# Patient Record
Sex: Female | Born: 1985 | Race: Black or African American | Hispanic: No | Marital: Single | Smoking: Current every day smoker
Health system: Southern US, Community
[De-identification: ages and names within clinical notes are randomized; demographics above are authoritative.]

## PROBLEM LIST (undated history)

## (undated) DIAGNOSIS — J45909 Unspecified asthma, uncomplicated: Secondary | ICD-10-CM

## (undated) DIAGNOSIS — R569 Unspecified convulsions: Secondary | ICD-10-CM

## (undated) DIAGNOSIS — M5126 Other intervertebral disc displacement, lumbar region: Secondary | ICD-10-CM

---

## 2014-03-04 ENCOUNTER — Emergency Department (HOSPITAL_COMMUNITY): Payer: Medicaid - Out of State

## 2014-03-04 ENCOUNTER — Encounter (HOSPITAL_COMMUNITY): Payer: Self-pay | Admitting: Emergency Medicine

## 2014-03-04 ENCOUNTER — Emergency Department (HOSPITAL_COMMUNITY)
Admission: EM | Admit: 2014-03-04 | Discharge: 2014-03-04 | Disposition: A | Discharge status: Home / Self Care | Payer: Medicaid - Out of State | Attending: Emergency Medicine | Admitting: Emergency Medicine

## 2014-03-04 DIAGNOSIS — M549 Dorsalgia, unspecified: Secondary | ICD-10-CM | POA: Diagnosis not present

## 2014-03-04 DIAGNOSIS — Z3202 Encounter for pregnancy test, result negative: Secondary | ICD-10-CM | POA: Insufficient documentation

## 2014-03-04 DIAGNOSIS — Z87828 Personal history of other (healed) physical injury and trauma: Secondary | ICD-10-CM | POA: Insufficient documentation

## 2014-03-04 DIAGNOSIS — G8929 Other chronic pain: Secondary | ICD-10-CM | POA: Diagnosis not present

## 2014-03-04 DIAGNOSIS — F17200 Nicotine dependence, unspecified, uncomplicated: Secondary | ICD-10-CM | POA: Diagnosis not present

## 2014-03-04 DIAGNOSIS — J45909 Unspecified asthma, uncomplicated: Secondary | ICD-10-CM | POA: Diagnosis not present

## 2014-03-04 DIAGNOSIS — M5126 Other intervertebral disc displacement, lumbar region: Secondary | ICD-10-CM

## 2014-03-04 HISTORY — DX: Unspecified asthma, uncomplicated: J45.909

## 2014-03-04 HISTORY — DX: Other intervertebral disc displacement, lumbar region: M51.26

## 2014-03-04 LAB — POC URINE PREG, ED: Preg Test, Ur: NEGATIVE

## 2014-03-04 MED ORDER — ACETAMINOPHEN-CODEINE #3 300-30 MG PO TABS: 1.0000 | ORAL_TABLET | Freq: Three times a day (TID) | ORAL | Status: DC | PRN

## 2014-03-04 MED ORDER — IBUPROFEN 400 MG PO TABS
800.0000 mg | ORAL_TABLET | Freq: Once | ORAL | Status: AC
  Administered 2014-03-04: 800 mg via ORAL
  Filled 2014-03-04: qty 2

## 2014-03-04 MED ORDER — CYCLOBENZAPRINE HCL 10 MG PO TABS: 10.0000 mg | ORAL_TABLET | Freq: Two times a day (BID) | ORAL | Status: DC | PRN
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
## 2014-03-04 NOTE — ED Notes (Signed)
Per pt woke up this am with lower back pain. Hx of herniated disc. sts feels the same. Denies urinary or vaginal complaints.

## 2014-03-04 NOTE — Discharge Instructions (Signed)
Please call your doctor for a followup appointment within 24-48 hours. When you talk to your doctor please let them know that you were seen in the emergency department and have them acquire all of your records so that they can discuss the findings with you and formulate a treatment plan to fully care for your new and ongoing problems. Please call and set-up an appointment with your primary care provider and orthopedics Please rest and stay hydrated please apply heat and massage Please take medications as prescribed - while on pain medications there is to be no drinking alcohol, driving, operating any heavy machinery. If extra please dispose in a proper manner. Please do not take any extra Tylenol with this medication for this can lead to Tylenol overdose and liver issues.  Please avoid any physical or strenuous activities Please continue to monitor symptoms closely and if symptoms are to worsen or change (fever greater than 101, chills, sweating, nausea, vomiting, chest pain, shortness of breathe, difficulty breathing, weakness, numbness, tingling, worsening or changes to pain pattern, fall, injury, inability to control urine or bowel movements, weakness) please report back to the Emergency Department immediately.    Back Exercises Back exercises help treat and prevent back injuries. The goal is to increase your strength in your belly (abdominal) and back muscles. These exercises can also help with flexibility. Start these exercises when told by your doctor. HOME CARE Back exercises include: Pelvic Tilt.  Lie on your back with your knees bent. Tilt your pelvis until the lower part of your back is against the floor. Hold this position 5 to 10 sec. Repeat this exercise 5 to 10 times. Knee to Chest.  Pull 1 knee up against your chest and hold for 20 to 30 seconds. Repeat this with the other knee. This may be done with the other leg straight or bent, whichever feels better. Then, pull both knees up  against your chest. Sit-Ups or Curl-Ups.  Bend your knees 90 degrees. Start with tilting your pelvis, and do a partial, slow sit-up. Only lift your upper half 30 to 45 degrees off the floor. Take at least 2 to 3 seonds for each sit-up. Do not do sit-ups with your knees out straight. If partial sit-ups are difficult, simply do the above but with only tightening your belly (abdominal) muscles and holding it as told. Hip-Lift.  Lie on your back with your knees flexed 90 degrees. Push down with your feet and shoulders as you raise your hips 2 inches off the floor. Hold for 10 seconds, repeat 5 to 10 times. Back Arches.  Lie on your stomach. Prop yourself up on bent elbows. Slowly press on your hands, causing an arch in your low back. Repeat 3 to 5 times. Shoulder-Lifts.  Lie face down with arms beside your body. Keep hips and belly pressed to floor as you slowly lift your head and shoulders off the floor. Do not overdo your exercises. Be careful in the beginning. Exercises may cause you some mild back discomfort. If the pain lasts for more than 15 minutes, stop the exercises until you see your doctor. Improvement with exercise for back problems is slow.  Document Released: 06/24/2010 Document Revised: 08/14/2011 Document Reviewed: 03/23/2011 [facility] Patient Information 2015 [place], [place]. This information is not intended to replace advice given to you by your health care provider. Make sure you discuss any questions you have with your health care provider. Back Pain, Adult Back pain is very common. The pain often gets better  over time. The cause of back pain is usually not dangerous. Most people can learn to manage their back pain on their own.  HOME CARE   Stay active. Start with short walks on flat ground if you can. Try to walk farther each day.  Do not sit, drive, or stand in one place for more than 30 minutes. Do not stay in bed.  Do not avoid exercise or work. Activity can help your back  heal faster.  Be careful when you bend or lift an object. Bend at your knees, keep the object close to you, and do not twist.  Sleep on a firm mattress. Lie on your side, and bend your knees. If you lie on your back, put a pillow under your knees.  Only take medicines as told by your doctor.  Put ice on the injured area.  Put ice in a plastic bag.  Place a towel between your skin and the bag.  Leave the ice on for 15-20 minutes, 03-04 times a day for the first 2 to 3 days. After that, you can switch between ice and heat packs.  Ask your doctor about back exercises or massage.  Avoid feeling anxious or stressed. Find good ways to deal with stress, such as exercise. GET HELP RIGHT AWAY IF:   Your pain does not go away with rest or medicine.  Your pain does not go away in 1 week.  You have new problems.  You do not feel well.  The pain spreads into your legs.  You cannot control when you poop (bowel movement) or pee (urinate).  Your arms or legs feel weak or lose feeling (numbness).  You feel sick to your stomach (nauseous) or throw up (vomit).  You have belly (abdominal) pain.  You feel like you may pass out (faint). MAKE SURE YOU:   Understand these instructions.  Will watch your condition.  Will get help right away if you are not doing well or get worse. Document Released: 11/08/2007 Document Revised: 08/14/2011 Document Reviewed: 09/23/2013 [facility] Patient Information 2015 [place], [place]. This information is not intended to replace advice given to you by your health care provider. Make sure you discuss any questions you have with your health care provider.   Emergency Department Resource Guide 1) Find a Doctor and Pay Out of Pocket Although you won't have to find out who is covered by your insurance plan, it is a good idea to ask around and get recommendations. You will then need to call the office and see if the doctor you have chosen will accept you as a new  patient and what types of options they offer for patients who are self-pay. Some doctors offer discounts or will set up payment plans for their patients who do not have insurance, but you will need to ask so you aren't surprised when you get to your appointment.  2) Contact Your Local Health Department Not all health departments have doctors that can see patients for sick visits, but many do, so it is worth a call to see if yours does. If you don't know where your local health department is, you can check in your phone book. The CDC also has a tool to help you locate your state's health department, and many state websites also have listings of all of their local health departments.  3) Find a Walk-in Clinic If your illness is not likely to be very severe or complicated, you may want to try a walk in  clinic. These are popping up all over the country in pharmacies, drugstores, and shopping centers. They're usually staffed by nurse practitioners or physician assistants that have been trained to treat common illnesses and complaints. They're usually fairly quick and inexpensive. However, if you have serious medical issues or chronic medical problems, these are probably not your best option.  No Primary Care Doctor: - Call Health Connect at  [phone] - they can help you locate a primary care doctor that  accepts your insurance, provides certain services, etc. - Physician Referral Service- [phone]  Chronic Pain Problems: Organization         Address  Phone   Notes  [facility]  [phone] Patients need to be referred by their primary care doctor.   Medication Assistance: Organization         Address  Phone   Notes  [facility] Medication [facility] [address] [place], [place] [address] [phone] --Must be a resident of [facility] -- Must have NO insurance coverage whatsoever (no Medicaid/ Medicare, etc.) -- The pt. MUST have a primary  care doctor that directs their care regularly and follows them in the community   [facility]  [phone]   [employer]  [phone]    Agencies that provide inexpensive medical care: Organization         Address  Phone   Notes  [facility]  [phone]   [facility]    [phone]   [facility] [address] [place], [place] [address] [phone]   [facility] [address], [place] [phone]   Planned Parenthood    ([phone]   [facility]    [phone]   [facility]  [address], [place] Phone:  [phone], Fax:  [phone] Hours of Operation:  9 am - 6 pm, M-F.  Also accepts Medicaid/Medicare and self-pay.  [facility]  [address], Agency Phone: [phone], Fax: [phone]. Hours of Operation:  8:30 am - 5:30 pm, M-F.  Also accepts Medicaid and self-pay.  [facility] [place] [address], [place] Phone: [phone]   [facility] [address] Natasha Bence [place], [place] [phone], Ext. [phone] Mondays & Thursdays: 7-9 AM.  First 15 patients are seen on a first come, first serve basis.    Medicaid-accepting [facility] Providers:  Organization         Address  Phone   Notes  [facility] [address], [place] [phone] Also accepts self-pay patients.  [facility] [address] Laurell Josephs [place], [place]  [phone]   [facility] [address], [place] [phone]   [facility] [address], [place] [phone]   Renaye Rakers [address], [place]   [phone] Only accepts [place] Access [place] patients after they have their name applied to their card.   Self-Pay (no insurance) in [facility]:  Organization         Address  Phone   Notes  Sickle Cell Patients, [facility] Internal [facility] [address] [place], [place] [phone]   [facility] [address] [place], [place] [phone]   [facility] [place]  [address], [place] [phone]   [facility]/Dr. Osei-Bonsu  [address] [place] or [address], [place] [phone] Phone number for both [place] and [place] locations is the same.  [facility] [address] [place] [phone]   [facility] [address] [place] or [address] [phone]   [facility] [address] [place] [phone], phone; [phone], fax Sees patients 1st and 3rd Saturday of every month.  Must not qualify for public or private insurance (i.e. Medicaid, Medicare, [place] Health Choice, Veterans' Benefits)  Household income should be no more than 200% of the poverty level The clinic cannot treat you if you are pregnant or think you are pregnant  Sexually transmitted diseases are not treated at the clinic.    Dental Care: Organization         Address  Phone  Notes  [facility] [address] [place], [place] [phone] Accepts children up to age 45 who are enrolled in [place] or [place] Health Choice; pregnant women with a Medicaid card; and children who have applied for Medicaid or [place] Health Choice, but were declined, whose parents can pay a reduced fee at time of service.  [facility] Department of [facility]  [address], [place] [phone] Accepts children up to age 3 who are enrolled in [place] or [place] Health Choice; pregnant women with a Medicaid card; and children who have applied for Medicaid or  Health Choice, but were declined, whose parents can  pay a reduced fee at time of service.  [facility]  [address] [place], [place] [phone] Patients are seen by appointment only. Walk-ins are not accepted. [facility] will see patients 37 years of age and older. Monday - Tuesday (8am-5pm) Most Wednesdays (8:30-5pm) $30 per visit, cash only  [facility] Adult Dental Access PROGRAM  [address], [facility] [phone] Patients are seen by appointment only. Walk-ins are not accepted. [facility] will see patients 4 years of age and older. One Wednesday Evening (Monthly: Volunteer Based).  $30 per visit, cash only  [employer]  [phone] for adults; Children under age 87, call Graduate Pediatric Dentistry at [phone]. Children aged 30-14, please call [phone] to request a pediatric application.  Dental services are provided in all areas of dental care including fillings, crowns and bridges, complete and partial dentures, implants, gum treatment, root canals, and extractions. Preventive care is also provided. Treatment is provided to both adults and children. Patients are selected via a lottery and there is often a waiting list.   [facility] [address] [place]  [phone] [email or web address]   [facility] [address] [place], [place] [phone], Ext. [phone] Second and Fourth Thursday of each month, opens at 6:30 AM; Clinic ends at 9 AM.  Patients are seen on a first-come first-served basis, and a limited number are seen during each clinic.   [facility]  [address] Ether Griffins [place], [place] [phone]   Eligibility Requirements You must have lived in [place], [place], or [place] counties for at least the last three months.   You cannot be eligible for state or federal sponsored [place], including [employer]  Administration, Medicaid, or Medicare.   You generally cannot be eligible for healthcare  insurance through your employer.    How to apply: Eligibility screenings are held every Tuesday and Wednesday afternoon from 1:00 pm until 4:00 pm. You do not need an appointment for the interview!  [facility] [address], [place], [place] [phone]   [facility]  [phone]   [facility] Health Department  [phone]   [facility] Health Department  [phone]    Behavioral Health Resources in the Community: Intensive Outpatient Programs Organization         Address  Phone  Notes  [facility] [address], [place], [place] [phone]   [facility] Outpatient [address] [place], [place] [phone]   [facility] [address], [place], [place]  [phone]   [facility] [address]  [place], [place] [phone] or [phone]   Substance Abuse Resources Organization         Address  Phone  Notes  [facility]  [phone]   [facility]  [phone]   [facility]  [phone]   Floydene Flock  [phone]   Residential & Outpatient Substance Abuse Program  [phone]   Psychological Services Organization         Address  Phone  Notes  [facility]  [phone]   [facility] Services  [phone]   [facility] Mental Health [address], [place] [phone] or [phone]    Mobile Crisis Teams Organization         Address  Phone  Notes  [facility], Mobile Crisis Care Unit  [phone]   [facility]  [address] [place], [place] [phone]   Doristine Locks [address] [place] [phone]    Self-Help/Support Groups Organization         Address  Phone             Notes  [facility] [place] - variety of support groups  336- I7437963 Call for more information  Narcotics Anonymous (NA),  [facility] [address], [employer] [place]  2 meetings at this location   Statistician         Address  Phone  Notes  [facility] [address],    [place]  [phone]   [facility]  [address] [place] [address], [place], [place] [phone]   [facility] [address] [place], [place] [phone] Admissions: 8am-3pm M-F  [facility] [address],    [place], [place] [phone]   [facility] [address] [place], [place], [place] [phone]   [facility] [address],  [place], [place] [phone]   [facility] - Intensive Outpatient [address] Laurell Josephs [address], [place], [place] [phone]   [facility] ([facility] [address] [place]  [place], [place] [phone] or [phone]   [facility] ([facility]) [address] [place], [place] [phone] Accepts Medicaid  [facility] [place] [address],  [place] [phone] Substance Abuse/Addiction Treatment   [facility] Organization         Address  Phone  Notes  [facility]  [phone]   Angie Fava, PhD [address] [place], [place]   ([phone] or [phone]  [facility]   [address] [place], [place] [phone]) [phone]   [facility] [address], [place], [place] [phone](336) [phone] Insurance/Medicaid/sponsorship through [facility]  [facility] [address]                                    [place], [place] [phone](336) [phone] Therapy/tele-psych/case  [facility] [address]   [place], [place] [phone]) [phone]    Dr. Lolly MustacheArfeen  [phone]) [phone]   [facility]  [employer] [facility] 1) [address], [place] 2) [address], [place] 3)  [address], [place] [phone](336) [phone](336) [phone]  [phone](336) [phone]    [facility] Child Abuse Hotline [phone](336) 930-419-4170 or [phone](336) [phone] (After Hours)

## 2014-03-06 NOTE — ED Provider Notes (Signed)
Medical screening examination/treatment/procedure(s) were performed by non-physician practitioner and as supervising physician I was immediately available for consultation/collaboration.   EKG Interpretation None        Kasandra Fehr T Shrey Boike, MD 03/06/14 0720 

## 2014-08-28 ENCOUNTER — Emergency Department (HOSPITAL_COMMUNITY)
Admission: EM | Admit: 2014-08-28 | Discharge: 2014-08-29 | Disposition: A | Discharge status: Home / Self Care | Payer: Medicaid Other | Attending: Emergency Medicine | Admitting: Emergency Medicine

## 2014-08-28 ENCOUNTER — Encounter (HOSPITAL_COMMUNITY): Payer: Self-pay | Admitting: Emergency Medicine

## 2014-08-28 DIAGNOSIS — J45909 Unspecified asthma, uncomplicated: Secondary | ICD-10-CM | POA: Insufficient documentation

## 2014-08-28 DIAGNOSIS — Z3202 Encounter for pregnancy test, result negative: Secondary | ICD-10-CM | POA: Insufficient documentation

## 2014-08-28 DIAGNOSIS — R112 Nausea with vomiting, unspecified: Secondary | ICD-10-CM | POA: Insufficient documentation

## 2014-08-28 DIAGNOSIS — R109 Unspecified abdominal pain: Secondary | ICD-10-CM | POA: Diagnosis not present

## 2014-08-28 DIAGNOSIS — Z8739 Personal history of other diseases of the musculoskeletal system and connective tissue: Secondary | ICD-10-CM | POA: Diagnosis not present

## 2014-08-28 DIAGNOSIS — Z720 Tobacco use: Secondary | ICD-10-CM | POA: Insufficient documentation

## 2014-08-28 DIAGNOSIS — N939 Abnormal uterine and vaginal bleeding, unspecified: Secondary | ICD-10-CM | POA: Diagnosis not present

## 2014-08-28 LAB — CBC WITH DIFFERENTIAL/PLATELET
Basophils Absolute: 0 10*3/uL (ref 0.0–0.1)
Basophils Relative: 0 % (ref 0–1)
EOS PCT: 0 % (ref 0–5)
Eosinophils Absolute: 0 10*3/uL (ref 0.0–0.7)
HCT: 41.9 % (ref 36.0–46.0)
Hemoglobin: 14.5 g/dL (ref 12.0–15.0)
LYMPHS PCT: 29 % (ref 12–46)
Lymphs Abs: 1.9 10*3/uL (ref 0.7–4.0)
MCH: 29.9 pg (ref 26.0–34.0)
MCHC: 34.6 g/dL (ref 30.0–36.0)
MCV: 86.4 fL (ref 78.0–100.0)
MONO ABS: 0.4 10*3/uL (ref 0.1–1.0)
Monocytes Relative: 6 % (ref 3–12)
Neutro Abs: 4.3 10*3/uL (ref 1.7–7.7)
Neutrophils Relative %: 65 % (ref 43–77)
Platelets: 227 10*3/uL (ref 150–400)
RBC: 4.85 MIL/uL (ref 3.87–5.11)
RDW: 12.7 % (ref 11.5–15.5)
WBC: 6.7 10*3/uL (ref 4.0–10.5)

## 2014-08-28 LAB — URINALYSIS, ROUTINE W REFLEX MICROSCOPIC
GLUCOSE, UA: NEGATIVE mg/dL
HGB URINE DIPSTICK: NEGATIVE
Leukocytes, UA: NEGATIVE
Nitrite: NEGATIVE
Protein, ur: NEGATIVE mg/dL
Specific Gravity, Urine: 1.031 — ABNORMAL HIGH (ref 1.005–1.030)
Urobilinogen, UA: 2 mg/dL — ABNORMAL HIGH (ref 0.0–1.0)
pH: 7 (ref 5.0–8.0)

## 2014-08-28 LAB — COMPREHENSIVE METABOLIC PANEL
ALBUMIN: 4.6 g/dL (ref 3.5–5.2)
ALK PHOS: 44 U/L (ref 39–117)
ALT: 17 U/L (ref 0–35)
AST: 19 U/L (ref 0–37)
Anion gap: 8 (ref 5–15)
BILIRUBIN TOTAL: 0.5 mg/dL (ref 0.3–1.2)
BUN: 17 mg/dL (ref 6–23)
CALCIUM: 8.7 mg/dL (ref 8.4–10.5)
CO2: 24 mmol/L (ref 19–32)
CREATININE: 0.65 mg/dL (ref 0.50–1.10)
Chloride: 107 mmol/L (ref 96–112)
GFR calc Af Amer: >90 mL/min (ref >90)
GFR calc non Af Amer: >90 mL/min (ref >90)
Glucose, Bld: 109 mg/dL — ABNORMAL HIGH (ref 70–99)
Potassium: 3.6 mmol/L (ref 3.5–5.1)
Sodium: 139 mmol/L (ref 135–145)
Total Protein: 7.4 g/dL (ref 6.0–8.3)

## 2014-08-28 LAB — POC URINE PREG, ED: Preg Test, Ur: NEGATIVE

## 2014-08-28 MED ORDER — ONDANSETRON 8 MG PO TBDP: 8.0000 mg | ORAL_TABLET | Freq: Three times a day (TID) | ORAL | Status: DC | PRN

## 2014-08-28 MED ORDER — SODIUM CHLORIDE 0.9 % IV BOLUS (SEPSIS)
1000.0000 mL | Freq: Once | INTRAVENOUS | Status: AC
  Administered 2014-08-28: 1000 mL via INTRAVENOUS

## 2014-08-28 MED ORDER — KETOROLAC TROMETHAMINE 30 MG/ML IJ SOLN
30.0000 mg | Freq: Once | INTRAMUSCULAR | Status: AC
  Administered 2014-08-28: 30 mg via INTRAVENOUS
  Filled 2014-08-28: qty 1

## 2014-08-28 MED ORDER — ONDANSETRON HCL 4 MG/2ML IJ SOLN
4.0000 mg | Freq: Once | INTRAMUSCULAR | Status: AC
  Administered 2014-08-28: 4 mg via INTRAVENOUS
  Filled 2014-08-28: qty 2

## 2014-08-28 NOTE — ED Notes (Signed)
Pt c/o abdominal cramping and states she "stopped her depo and now I have started my period with severe cramps".  Patient denies passing any clots, heavy menses, and states that she is "spotting".  Pt states she could possibly be pregnant and the last sexual intercourse was two weeks ago.  Pt c/o N/V with too many episodes to count.  Pt A&Ox4 and is ambulatory.

## 2014-08-28 NOTE — ED Provider Notes (Signed)
CSN: [id number]     Arrival date & time 08/28/14  2028 History   First MD Initiated Contact with Patient 08/28/14 2214     Chief Complaint  Patient presents with  . Emesis  . Abdominal Cramping     (Consider location/radiation/quality/duration/timing/severity/associated sxs/prior Treatment) Patient is a 29 y.o. female presenting with vomiting and cramps. The history is provided by the patient.  Emesis Associated symptoms: abdominal pain   Associated symptoms: no diarrhea and no headaches   Abdominal Cramping Associated symptoms include abdominal pain. Pertinent negatives include no chest pain, no headaches and no shortness of breath.   patient presents with abdominal cramping nausea and vomiting. Had been on a double shot and recently finished up. She states that she started her menses has had some spotting last couple days. States she's had severe cramping. States that she had difficult periods all she was having them. Denies vaginal discharge. States she just had her spotting. No fevers. States she has been able to keep anything down today. She also has had crampy abdominal pain.  Past Medical History  Diagnosis Date  . Herniated lumbar intervertebral disc   . Asthma    History reviewed. No pertinent past surgical history. No family history on file. History  Substance Use Topics  . Smoking status: Current Every Day Smoker -- 0.50 packs/day  . Smokeless tobacco: Not on file  . Alcohol Use: Yes   OB History    No data available     Review of Systems  Constitutional: Negative for activity change and appetite change.  Eyes: Negative for pain.  Respiratory: Negative for chest tightness and shortness of breath.   Cardiovascular: Negative for chest pain and leg swelling.  Gastrointestinal: Positive for nausea, vomiting and abdominal pain. Negative for diarrhea.  Genitourinary: Positive for vaginal bleeding and menstrual problem. Negative for flank pain.  Musculoskeletal:  Negative for back pain and neck stiffness.  Skin: Negative for rash.  Neurological: Negative for weakness, numbness and headaches.  Psychiatric/Behavioral: Negative for behavioral problems.      Allergies  Bactrim  Home Medications   Prior to Admission medications   Medication Sig Start Date End Date Taking? Authorizing Provider  acetaminophen (TYLENOL) 500 MG tablet Take 1,000 mg by mouth every 6 (six) hours as needed for mild pain.   Yes Historical Provider, MD  acetaminophen-codeine (TYLENOL #3) 300-30 MG per tablet Take 1 tablet by mouth every 8 (eight) hours as needed for moderate pain. Patient not taking: Reported on 08/28/2014 03/04/14   Marissa Sciacca, PA-C  cyclobenzaprine (FLEXERIL) 10 MG tablet Take 1 tablet (10 mg total) by mouth 2 (two) times daily as needed for muscle spasms. Patient not taking: Reported on 08/28/2014 03/04/14   Marissa Sciacca, PA-C  ondansetron (ZOFRAN-ODT) 8 MG disintegrating tablet Take 1 tablet (8 mg total) by mouth every 8 (eight) hours as needed for nausea or vomiting. 08/28/14   Benjiman CoreNathan Orris Perin, MD   BP 135/89 mmHg  Pulse 91  Temp(Src) 98.1 F (36.7 C) (Oral)  Resp 16  SpO2 100%  LMP 08/28/2014 Physical Exam  Constitutional: She is oriented to person, place, and time. She appears well-developed.  HENT:  Head: Normocephalic and atraumatic.  Cardiovascular: Normal rate, regular rhythm and normal heart sounds.   No murmur heard. Pulmonary/Chest: Effort normal and breath sounds normal. No respiratory distress. She has no wheezes. She has no rales.  Abdominal: Soft. She exhibits no mass. There is no tenderness. There is no guarding.  Musculoskeletal: Normal range of  motion.  Neurological: She is alert and oriented to person, place, and time. No cranial nerve deficit.  Skin: Skin is warm and dry.  Psychiatric: She has a normal mood and affect. Her speech is normal.  Nursing note and vitals reviewed.   ED Course  Procedures (including critical  care time) Labs Review Labs Reviewed  COMPREHENSIVE METABOLIC PANEL - Abnormal; Notable for the following:    Glucose, Bld 109 (*)    All other components within normal limits  URINALYSIS, ROUTINE W REFLEX MICROSCOPIC - Abnormal; Notable for the following:    APPearance CLOUDY (*)    Specific Gravity, Urine 1.031 (*)    Bilirubin Urine SMALL (*)    Ketones, ur >80 (*)    Urobilinogen, UA 2.0 (*)    All other components within normal limits  CBC WITH DIFFERENTIAL/PLATELET  POC URINE PREG, ED    Imaging Review No results found.   EKG Interpretation None      MDM   Final diagnoses:  Non-intractable vomiting with nausea, vomiting of unspecified type    Patient with nausea and vomiting. May be related to return of her menses after being on the Depo-Provera shot. Has had bad menses in the past. Patient feels better as tolerated orals in ER. We'll give second IV fluid and discharge home. Will follow-up with her PCP as needed.    Benjiman Core, MD 08/28/14 [phone]

## 2014-08-28 NOTE — Discharge Instructions (Signed)

## 2014-09-21 ENCOUNTER — Emergency Department (HOSPITAL_COMMUNITY): Payer: Medicaid - Out of State

## 2014-09-21 ENCOUNTER — Emergency Department (HOSPITAL_COMMUNITY)
Admission: EM | Admit: 2014-09-21 | Discharge: 2014-09-21 | Disposition: A | Discharge status: Home / Self Care | Payer: Medicaid - Out of State | Attending: Emergency Medicine | Admitting: Emergency Medicine

## 2014-09-21 ENCOUNTER — Encounter (HOSPITAL_COMMUNITY): Payer: Self-pay | Admitting: Emergency Medicine

## 2014-09-21 DIAGNOSIS — R569 Unspecified convulsions: Secondary | ICD-10-CM | POA: Diagnosis not present

## 2014-09-21 DIAGNOSIS — J45909 Unspecified asthma, uncomplicated: Secondary | ICD-10-CM | POA: Insufficient documentation

## 2014-09-21 DIAGNOSIS — Z720 Tobacco use: Secondary | ICD-10-CM | POA: Insufficient documentation

## 2014-09-21 HISTORY — DX: Unspecified convulsions: R56.9

## 2014-09-21 LAB — CBC WITH DIFFERENTIAL/PLATELET
Basophils Absolute: 0 10*3/uL (ref 0.0–0.1)
Basophils Relative: 1 % (ref 0–1)
Eosinophils Absolute: 0 10*3/uL (ref 0.0–0.7)
Eosinophils Relative: 0 % (ref 0–5)
HCT: 41.5 % (ref 36.0–46.0)
Hemoglobin: 13.6 g/dL (ref 12.0–15.0)
LYMPHS ABS: 1.6 10*3/uL (ref 0.7–4.0)
LYMPHS PCT: 36 % (ref 12–46)
MCH: 28.7 pg (ref 26.0–34.0)
MCHC: 32.8 g/dL (ref 30.0–36.0)
MCV: 87.6 fL (ref 78.0–100.0)
Monocytes Absolute: 0.4 10*3/uL (ref 0.1–1.0)
Monocytes Relative: 9 % (ref 3–12)
NEUTROS PCT: 54 % (ref 43–77)
Neutro Abs: 2.4 10*3/uL (ref 1.7–7.7)
PLATELETS: 249 10*3/uL (ref 150–400)
RBC: 4.74 MIL/uL (ref 3.87–5.11)
RDW: 12.9 % (ref 11.5–15.5)
WBC: 4.4 10*3/uL (ref 4.0–10.5)

## 2014-09-21 LAB — COMPREHENSIVE METABOLIC PANEL
ALT: 22 U/L (ref 0–35)
AST: 23 U/L (ref 0–37)
Albumin: 4.4 g/dL (ref 3.5–5.2)
Alkaline Phosphatase: 55 U/L (ref 39–117)
Anion gap: 9 (ref 5–15)
BILIRUBIN TOTAL: 0.8 mg/dL (ref 0.3–1.2)
BUN: 17 mg/dL (ref 6–23)
CALCIUM: 8.4 mg/dL (ref 8.4–10.5)
CHLORIDE: 102 mmol/L (ref 96–112)
CO2: 25 mmol/L (ref 19–32)
Creatinine, Ser: 0.78 mg/dL (ref 0.50–1.10)
GFR calc Af Amer: >90 mL/min (ref >90)
Glucose, Bld: 99 mg/dL (ref 70–99)
Potassium: 3.6 mmol/L (ref 3.5–5.1)
Sodium: 136 mmol/L (ref 135–145)
Total Protein: 7.3 g/dL (ref 6.0–8.3)

## 2014-09-21 LAB — RAPID URINE DRUG SCREEN, HOSP PERFORMED
Amphetamines: NOT DETECTED
Barbiturates: NOT DETECTED
Benzodiazepines: NOT DETECTED
COCAINE: NOT DETECTED
OPIATES: NOT DETECTED
Tetrahydrocannabinol: NOT DETECTED

## 2014-09-21 LAB — ETHANOL

## 2014-09-21 MED ORDER — SODIUM CHLORIDE 0.9 % IV BOLUS (SEPSIS)
1000.0000 mL | Freq: Once | INTRAVENOUS | Status: AC
  Administered 2014-09-21: 1000 mL via INTRAVENOUS

## 2014-09-21 MED ORDER — ACETAMINOPHEN 325 MG PO TABS
650.0000 mg | ORAL_TABLET | Freq: Once | ORAL | Status: AC
  Administered 2014-09-21: 650 mg via ORAL
  Filled 2014-09-21: qty 2

## 2014-09-21 NOTE — ED Notes (Signed)
Per EMS: Pt was on bus when she started having a seizure. Witnesses report seizures three times. Hx of seizures but doesn't take any medications. Pt post-ictal on EMS arrival. Pt A&O x 2 upon arrival to hospital.

## 2014-09-21 NOTE — ED Notes (Signed)
Bed: WA01 Expected date:  Expected time:  Means of arrival:  Comments: EMS- 28yo F, seizure

## 2014-09-21 NOTE — ED Provider Notes (Signed)
CSN: [id number]     Arrival date & time 09/21/14  1617 History   First MD Initiated Contact with Patient 09/21/14 1637     Chief Complaint  Patient presents with  . Seizures     (Consider location/radiation/quality/duration/timing/severity/associated sxs/prior Treatment) HPI  Kelly Huang is a 29 y.o. female with PMH of asthma presenting via EMS for reported seizure. Witnesses reported generalized rhythmic movements 3 on bus today. Per friend who is at bedside patient with 40 minutes of confusion after the event. Patient currently oriented 3. Per patient no tongue biting or bladder or bowel incontinence. Patient reports decreased oral intake with only a couple sips today. No history of illicit drug use. Pt does not take medications. Patient reports history of syncope one year ago but has never had confusion like this. She has not taken any medications for her seizures. She was never evaluated by neurology.    Past Medical History  Diagnosis Date  . Herniated lumbar intervertebral disc   . Asthma   . Seizures    History reviewed. No pertinent past surgical history. No family history on file. History  Substance Use Topics  . Smoking status: Current Every Day Smoker -- 0.50 packs/day  . Smokeless tobacco: Not on file  . Alcohol Use: Yes   OB History    No data available     Review of Systems 10 Systems reviewed and are negative for acute change except as noted in the HPI.    Allergies  Bactrim  Home Medications   Prior to Admission medications   Medication Sig Start Date End Date Taking? Authorizing Provider  acetaminophen-codeine (TYLENOL #3) 300-30 MG per tablet Take 1 tablet by mouth every 8 (eight) hours as needed for moderate pain. Patient not taking: Reported on 08/28/2014 03/04/14   Marissa Sciacca, PA-C  cyclobenzaprine (FLEXERIL) 10 MG tablet Take 1 tablet (10 mg total) by mouth 2 (two) times daily as needed for muscle spasms. Patient not taking: Reported on  08/28/2014 03/04/14   Marissa Sciacca, PA-C  ondansetron (ZOFRAN-ODT) 8 MG disintegrating tablet Take 1 tablet (8 mg total) by mouth every 8 (eight) hours as needed for nausea or vomiting. Patient not taking: Reported on 09/21/2014 08/28/14   Benjiman Core, MD   BP 116/67 mmHg  Pulse 75  Temp(Src) 98.3 F (36.8 C) (Oral)  Resp 16  SpO2 100%  LMP 08/28/2014 (Exact Date) Physical Exam  Constitutional: She is oriented to person, place, and time. She appears well-developed and well-nourished. No distress.  HENT:  Head: Normocephalic and atraumatic.  Mouth/Throat: Oropharynx is clear and moist.  Eyes: Conjunctivae and EOM are normal. Pupils are equal, round, and reactive to light. Right eye exhibits no discharge. Left eye exhibits no discharge.  Neck: Normal range of motion. Neck supple.  No nuchal rigidity  Cardiovascular: Normal rate and regular rhythm.   Pulmonary/Chest: Effort normal and breath sounds normal. No respiratory distress. She has no wheezes.  Abdominal: Soft. Bowel sounds are normal. She exhibits no distension. There is no tenderness.  Neurological: She is alert and oriented to person, place, and time. No cranial nerve deficit. Coordination normal.  Speech is clear and goal oriented. Peripheral visual fields intact. Strength 5/5 in upper and lower extremities. Sensation intact. Intact rapid alternating movements, finger to nose, and heel to shin. Negative Romberg. No pronator drift. Normal gait.    Skin: Skin is warm and dry. She is not diaphoretic.  Nursing note and vitals reviewed.   ED Course  Procedures (  including critical care time) Labs Review Labs Reviewed  CBC WITH DIFFERENTIAL/PLATELET  COMPREHENSIVE METABOLIC PANEL  ETHANOL  URINE RAPID DRUG SCREEN (HOSP PERFORMED)    Imaging Review Ct Head Wo Contrast  09/21/2014   CLINICAL DATA:  Seizure.  EXAM: CT HEAD WITHOUT CONTRAST  TECHNIQUE: Contiguous axial images were obtained from the base of the skull through  the vertex without intravenous contrast.  COMPARISON:  None.  FINDINGS: Bony calvarium appears intact. No mass effect or midline shift is noted. Ventricular size is within normal limits. There is no evidence of mass lesion, hemorrhage or acute infarction.  IMPRESSION: Normal head CT.   Electronically Signed   By: Lupita RaiderJames  Green Jr, M.D.   On: 09/21/2014 17:34     EKG Interpretation   Date/Time:  Monday September 21 2014 16:37:15 EDT Ventricular Rate:  67 PR Interval:  182 QRS Duration: 105 QT Interval:  393 QTC Calculation: 415 R Axis:   99 Text Interpretation:  Sinus arrhythmia Borderline right axis deviation No  old tracing to compare Confirmed by Ethelda ChickJACUBOWITZ  MD, SAM [id number]) on  09/21/2014 4:48:00 PM      MDM   Final diagnoses:  Seizure   Patient presenting with possible first onset seizure with postictal confusion. At arrival VSS. Patient with no neurological deficits and denies any confusion. Workup unremarkable. Symptoms could've been due to dehydration. No obvious cause. Patient reports improvement with fluids in ED has no further complaints at this time. Discussed seizure precautions patient verbalizes agreement. Patient to follow-up with neurology for further workup.  Discussed return precautions with patient. Discussed all results and patient verbalizes understanding and agrees with plan.  Case has been discussed with Dr. Criss AlvineGoldston who agrees with the above plan and to discharge.    Oswaldo ConroyVictoria Brodi Kari, PA-C 09/23/14 [id number]  Pricilla LovelessScott Goldston, MD 09/26/14 [phone]

## 2014-09-21 NOTE — ED Notes (Signed)
Pt A&O x 4 at this time, follows commands. No neuro deficits noted. Pt states last seizure was years ago.

## 2014-09-21 NOTE — Discharge Instructions (Signed)
Return to the emergency room with worsening of symptoms, new symptoms or with symptoms that are concerning , especially additional seizure/loss of consciousness, unresponsive, severe headache, visual or speech changes, weakness in face, arms or legs. No driving, operating machinery, swimming, tub baths, or high risk activity including scuba diving, skydiving until evaluated by neurology. Call neurology about the schedule appointment as soon as possible. Read below information and follow recommendations.    Nonepileptic Seizures Nonepileptic seizures are seizures that are not caused by abnormal electrical signals in your brain. These seizures often seem like epileptic seizures, but they are not caused by epilepsy.  There are two types of nonepileptic seizures:  A physiologic nonepileptic seizure results from a disruption in your brain.  A psychogenic seizure results from emotional stress. These seizures are sometimes called pseudoseizures. CAUSES  Causes of physiologic nonepileptic seizures include:   Sudden drop in blood pressure.  Low blood sugar.  Low levels of salt (sodium) in your blood.  Low levels of calcium in your blood.  Migraine.  Heart rhythm problems.  Sleep disorders.  Drug and alcohol abuse. Common causes of psychogenic nonepileptic seizures include:  Stress.  Emotional trauma.  Sexual or physical abuse.  Major life events, such as divorce or the death of a loved one.  Mental health disorders, including panic attack and hyperactivity disorder. SIGNS AND SYMPTOMS A nonepileptic seizure can look like an epileptic seizure, including uncontrollable shaking (convulsions), or changes in attention, behavior, or the ability to remain awake and alert. However, there are some differences. Nonepileptic seizures usually:  Do not cause physical injuries.  Start slowly.  Include crying or shrieking.  Last longer than 2 minutes.  Have a short recovery time without  headache or exhaustion. DIAGNOSIS  Your health care provider can usually diagnose nonepileptic seizures after taking your medical history and giving you a physical exam. Your health care provider may want to talk to your friends or relatives who have seen you have a seizure.  You may also need to have tests to look for causes of physiologic nonepileptic seizures. This may include an electroencephalogram (EEG), which is a test that measures electrical activity in your brain. If you have had an epileptic seizure, the results of your EEG will be abnormal. If your health care provider thinks you have had a psychogenic nonepileptic seizure, you may need to see a mental health specialist for an evaluation. TREATMENT  Treatment depends on the type and cause of your seizures.  For physiologic nonepileptic seizures, treatment is aimed at addressing the underlying condition that caused the seizures. These seizures usually stop when the underlying condition is properly treated.  Nonepileptic seizures do not respond to the seizure medicines used to treat epilepsy.  For psychogenic seizures, you may need to work with a mental health specialist. HOME CARE INSTRUCTIONS Home care will depend on the type of nonepileptic seizures you have.   Follow all your health care provider's instructions.  Keep all your follow-up appointments. SEEK MEDICAL CARE IF: You continue to have seizures after treatment. SEEK IMMEDIATE MEDICAL CARE IF:  Your seizures change or become more frequent.  You injure yourself during a seizure.  You have one seizure after another.  You have trouble recovering from a seizure.  You have chest pain or trouble breathing. MAKE SURE YOU:  Understand these instructions.  Will watch your condition.  Will get help right away if you are not doing well or get worse. Document Released: 07/07/2005 Document Revised: 10/06/2013 Document Reviewed:  03/18/2013 ExitCare Patient Information  2015 [employer] [place]. This information is not intended to replace advice given to you by your health care provider. Make sure you discuss any questions you have with your health care provider.

## 2014-12-28 ENCOUNTER — Encounter (HOSPITAL_COMMUNITY): Payer: Self-pay | Admitting: Family Medicine

## 2014-12-28 ENCOUNTER — Emergency Department (HOSPITAL_COMMUNITY)
Admission: EM | Admit: 2014-12-28 | Discharge: 2014-12-28 | Disposition: A | Discharge status: Home / Self Care | Payer: Medicaid Other | Attending: Emergency Medicine | Admitting: Emergency Medicine

## 2014-12-28 DIAGNOSIS — Z720 Tobacco use: Secondary | ICD-10-CM | POA: Insufficient documentation

## 2014-12-28 DIAGNOSIS — Z8739 Personal history of other diseases of the musculoskeletal system and connective tissue: Secondary | ICD-10-CM | POA: Insufficient documentation

## 2014-12-28 DIAGNOSIS — J45909 Unspecified asthma, uncomplicated: Secondary | ICD-10-CM | POA: Diagnosis not present

## 2014-12-28 DIAGNOSIS — Z3202 Encounter for pregnancy test, result negative: Secondary | ICD-10-CM | POA: Diagnosis not present

## 2014-12-28 DIAGNOSIS — R42 Dizziness and giddiness: Secondary | ICD-10-CM | POA: Insufficient documentation

## 2014-12-28 DIAGNOSIS — R350 Frequency of micturition: Secondary | ICD-10-CM | POA: Diagnosis not present

## 2014-12-28 LAB — CBC
HCT: 40.5 % (ref 36.0–46.0)
Hemoglobin: 13.4 g/dL (ref 12.0–15.0)
MCH: 29.9 pg (ref 26.0–34.0)
MCHC: 33.1 g/dL (ref 30.0–36.0)
MCV: 90.4 fL (ref 78.0–100.0)
Platelets: 238 K/uL (ref 150–400)
RBC: 4.48 MIL/uL (ref 3.87–5.11)
RDW: 13 % (ref 11.5–15.5)
WBC: 4.9 K/uL (ref 4.0–10.5)

## 2014-12-28 LAB — URINALYSIS, ROUTINE W REFLEX MICROSCOPIC
Glucose, UA: NEGATIVE mg/dL
HGB URINE DIPSTICK: NEGATIVE
Ketones, ur: 15 mg/dL — AB
NITRITE: NEGATIVE
PROTEIN: NEGATIVE mg/dL
Specific Gravity, Urine: 1.029 (ref 1.005–1.030)
Urobilinogen, UA: 1 mg/dL (ref 0.0–1.0)
pH: 6 (ref 5.0–8.0)

## 2014-12-28 LAB — URINE MICROSCOPIC-ADD ON

## 2014-12-28 LAB — BASIC METABOLIC PANEL WITH GFR
Anion gap: 5 (ref 5–15)
BUN: 15 mg/dL (ref 6–20)
CO2: 25 mmol/L (ref 22–32)
Calcium: 9 mg/dL (ref 8.9–10.3)
Chloride: 108 mmol/L (ref 101–111)
Creatinine, Ser: 0.71 mg/dL (ref 0.44–1.00)
GFR calc Af Amer: >60 mL/min
GFR calc non Af Amer: >60 mL/min
Glucose, Bld: 97 mg/dL (ref 65–99)
Potassium: 3.9 mmol/L (ref 3.5–5.1)
Sodium: 138 mmol/L (ref 135–145)

## 2014-12-28 LAB — I-STAT BETA HCG BLOOD, ED (MC, WL, AP ONLY): I-stat hCG, quantitative: <5 m[IU]/mL (ref <5)

## 2014-12-28 MED ORDER — MECLIZINE HCL 25 MG PO TABS
25.0000 mg | ORAL_TABLET | Freq: Once | ORAL | Status: AC
  Administered 2014-12-28: 25 mg via ORAL
  Filled 2014-12-28: qty 1

## 2014-12-28 MED ORDER — MECLIZINE HCL 25 MG PO TABS: 25.0000 mg | ORAL_TABLET | Freq: Three times a day (TID) | ORAL | Status: DC | PRN

## 2014-12-28 MED ORDER — CEPHALEXIN 500 MG PO CAPS: 500.0000 mg | ORAL_CAPSULE | Freq: Three times a day (TID) | ORAL | Status: DC

## 2014-12-28 NOTE — Discharge Instructions (Signed)
Take the prescribed medication as directed. °Return to the ED for new or worsening symptoms. ° °

## 2014-12-28 NOTE — ED Notes (Signed)
PT placed in gown and in bed. Pt monitored by pulse ox and bp cuff. 

## 2014-12-28 NOTE — ED Notes (Signed)
Pt offered PO fluids - tolerated well.

## 2014-12-28 NOTE — ED Notes (Signed)
Pt here for dizziness since she woke up this am. sts hx of same before and possible seizure like activity.

## 2014-12-28 NOTE — ED Provider Notes (Signed)
CSN: [id number]     Arrival date & time 12/28/14  1019 History   First MD Initiated Contact with Patient 12/28/14 1313     Chief Complaint  Patient presents with  . Dizziness     (Consider location/radiation/quality/duration/timing/severity/associated sxs/prior Treatment) Patient is a 29 y.o. female presenting with dizziness. The history is provided by the patient and medical records.  Dizziness   This is a 29 year old female with history of asthma, herniated lumbar disc, solitary episode of seizures earlier this year without diagnosed seizure disorder, presenting to the ED for dizziness. Patient states since waking this morning she has been having intermittent episodes of lightheadedness. She states symptoms are worse upon standing and movement. She states when this occurs she feels somewhat off balance. She denies any feelings of syncope. No chest pain or shortness of breath. Patient denies any fever, chills, or recent illness.  No headache or neck pain.  Patient states after her last seizure she was not started on seizure medication. She states she mainly came in today because she was concerned her symptoms were precursor to another seizure.  She denies any tremors or known seizure activity today.  Also, patient does admit to some urinary frequency recently. She denies any dysuria.  No abdominal pain, flank pain, nausea, or vomiting.  VSS.  Past Medical History  Diagnosis Date  . Herniated lumbar intervertebral disc   . Asthma   . Seizures    History reviewed. No pertinent past surgical history. History reviewed. No pertinent family history. History  Substance Use Topics  . Smoking status: Current Every Day Smoker -- 0.50 packs/day  . Smokeless tobacco: Not on file  . Alcohol Use: Yes   OB History    No data available     Review of Systems  Neurological: Positive for dizziness.  All other systems reviewed and are negative.     Allergies  Bactrim  Home Medications    Prior to Admission medications   Medication Sig Start Date End Date Taking? Authorizing Provider  acetaminophen-codeine (TYLENOL #3) 300-30 MG per tablet Take 1 tablet by mouth every 8 (eight) hours as needed for moderate pain. Patient not taking: Reported on 08/28/2014 03/04/14   Marissa Sciacca, PA-C  cyclobenzaprine (FLEXERIL) 10 MG tablet Take 1 tablet (10 mg total) by mouth 2 (two) times daily as needed for muscle spasms. Patient not taking: Reported on 08/28/2014 03/04/14   Marissa Sciacca, PA-C  ondansetron (ZOFRAN-ODT) 8 MG disintegrating tablet Take 1 tablet (8 mg total) by mouth every 8 (eight) hours as needed for nausea or vomiting. Patient not taking: Reported on 09/21/2014 08/28/14   Benjiman Core, MD   BP 115/74 mmHg  Pulse 50  Temp(Src) 98.7 F (37.1 C) (Oral)  Resp 16  SpO2 100%  LMP 12/16/2014   Physical Exam  Constitutional: She is oriented to person, place, and time. She appears well-developed and well-nourished. No distress.  texting on cell phone, watching TV, NAD  HENT:  Head: Normocephalic and atraumatic.  Mouth/Throat: Oropharynx is clear and moist.  Eyes: Conjunctivae and EOM are normal. Pupils are equal, round, and reactive to light.  Neck: Normal range of motion. Neck supple.  Cardiovascular: Normal rate, regular rhythm and normal heart sounds.   Pulmonary/Chest: Effort normal and breath sounds normal. No respiratory distress. She has no wheezes.  Abdominal: Soft. Bowel sounds are normal. There is no tenderness. There is no guarding.  Musculoskeletal: Normal range of motion. She exhibits no edema.  Neurological: She is alert and  oriented to person, place, and time. No cranial nerve deficit.  AAOx3, answering questions appropriately; equal strength UE and LE bilaterally; CN grossly intact; moves all extremities appropriately without ataxia; no focal neuro deficits or facial asymmetry appreciated  Skin: Skin is warm and dry. She is not diaphoretic.   Psychiatric: She has a normal mood and affect.  Nursing note and vitals reviewed.   ED Course  Procedures (including critical care time) Labs Review Labs Reviewed  URINALYSIS, ROUTINE W REFLEX MICROSCOPIC (NOT AT [facility]) - Abnormal; Notable for the following:    Color, Urine AMBER (*)    APPearance CLOUDY (*)    Bilirubin Urine SMALL (*)    Ketones, ur 15 (*)    Leukocytes, UA MODERATE (*)    All other components within normal limits  URINE CULTURE  BASIC METABOLIC PANEL  CBC  URINE MICROSCOPIC-ADD ON  I-STAT BETA HCG BLOOD, ED ([facility], [facility], [facility] ONLY)    Imaging Review No results found.   EKG Interpretation None      MDM   Final diagnoses:  Dizziness  Urinary frequency   29 year old female here with intermittent lightheadedness since this morning. No syncope. Patient afebrile, nontoxic. Her neurologic exam is nonfocal. She denies any headache or neck pain, doubt meningitis. Lab work is reassuring. U/a with moderate leuks and WBC's noted.  Given patient's urinary frequency will send for culture.  Patient was treated with meclizine and oral fluids with improvement of her symptoms. Her neurologic exam remains nonfocal. She has been out of bed and ambulated to restroom independently, steady gait.  She'll be discharged home with supportive care-- meclizine, keflex pending urine culture. She is to follow-up with her primary care physician.  Discussed plan with patient, he/she acknowledged understanding and agreed with plan of care.  Return precautions given for new or worsening symptoms.  Garlon Hatchet, PA-C 12/28/14 1508  Garlon Hatchet, PA-C 12/28/14 1508  Richardean Canal, MD 12/28/14 [phone]

## 2014-12-31 LAB — URINE CULTURE: Culture: >=100000

## 2015-01-02 ENCOUNTER — Telehealth: Payer: Self-pay | Admitting: *Deleted

## 2015-01-02 NOTE — ED Notes (Signed)
(+)  urine culture, treated with Cephalexin, OK per pharm 

## 2015-09-23 ENCOUNTER — Encounter (HOSPITAL_COMMUNITY): Payer: Self-pay | Admitting: Emergency Medicine

## 2015-09-23 ENCOUNTER — Emergency Department (HOSPITAL_COMMUNITY)
Admission: EM | Admit: 2015-09-23 | Discharge: 2015-09-23 | Disposition: A | Discharge status: Home / Self Care | Payer: BLUE CROSS/BLUE SHIELD | Attending: Emergency Medicine | Admitting: Emergency Medicine

## 2015-09-23 DIAGNOSIS — Z792 Long term (current) use of antibiotics: Secondary | ICD-10-CM | POA: Diagnosis not present

## 2015-09-23 DIAGNOSIS — Z79899 Other long term (current) drug therapy: Secondary | ICD-10-CM | POA: Diagnosis not present

## 2015-09-23 DIAGNOSIS — F17210 Nicotine dependence, cigarettes, uncomplicated: Secondary | ICD-10-CM | POA: Insufficient documentation

## 2015-09-23 DIAGNOSIS — Y9289 Other specified places as the place of occurrence of the external cause: Secondary | ICD-10-CM | POA: Diagnosis not present

## 2015-09-23 DIAGNOSIS — Y9389 Activity, other specified: Secondary | ICD-10-CM | POA: Insufficient documentation

## 2015-09-23 DIAGNOSIS — S299XXA Unspecified injury of thorax, initial encounter: Secondary | ICD-10-CM | POA: Insufficient documentation

## 2015-09-23 DIAGNOSIS — J45909 Unspecified asthma, uncomplicated: Secondary | ICD-10-CM | POA: Insufficient documentation

## 2015-09-23 DIAGNOSIS — Y998 Other external cause status: Secondary | ICD-10-CM | POA: Diagnosis not present

## 2015-09-23 DIAGNOSIS — M546 Pain in thoracic spine: Secondary | ICD-10-CM

## 2015-09-23 MED ORDER — ALBUTEROL SULFATE HFA 108 (90 BASE) MCG/ACT IN AERS: 1.0000 | INHALATION_SPRAY | Freq: Four times a day (QID) | RESPIRATORY_TRACT | Status: DC | PRN
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
## 2015-09-23 NOTE — Discharge Instructions (Signed)
Asthma, Adult Asthma is a recurring condition in which the airways tighten and narrow. Asthma can make it difficult to breathe. It can cause coughing, wheezing, and shortness of breath. Asthma episodes, also called asthma attacks, range from minor to life-threatening. Asthma cannot be cured, but medicines and lifestyle changes can help control it. CAUSES Asthma is believed to be caused by inherited (genetic) and environmental factors, but its exact cause is unknown. Asthma may be triggered by allergens, lung infections, or irritants in the air. Asthma triggers are different for each person. Common triggers include:   Animal dander.  Dust mites.  Cockroaches.  Pollen from trees or grass.  Mold.  Smoke.  Air pollutants such as dust, household cleaners, hair sprays, aerosol sprays, paint fumes, strong chemicals, or strong odors.  Cold air, weather changes, and winds (which increase molds and pollens in the air).  Strong emotional expressions such as crying or laughing hard.  Stress.  Certain medicines (such as aspirin) or types of drugs (such as beta-blockers).  Sulfites in foods and drinks. Foods and drinks that may contain sulfites include dried fruit, potato chips, and sparkling grape juice.  Infections or inflammatory conditions such as the flu, a cold, or an inflammation of the nasal membranes (rhinitis).  Gastroesophageal reflux disease (GERD).  Exercise or strenuous activity. SYMPTOMS Symptoms may occur immediately after asthma is triggered or many hours later. Symptoms include:  Wheezing.  Excessive nighttime or early morning coughing.  Frequent or severe coughing with a common cold.  Chest tightness.  Shortness of breath. DIAGNOSIS  The diagnosis of asthma is made by a review of your medical history and a physical exam. Tests may also be performed. These may include:  Lung function studies. These tests show how much air you breathe in and out.  Allergy  tests.  Imaging tests such as X-rays. TREATMENT  Asthma cannot be cured, but it can usually be controlled. Treatment involves identifying and avoiding your asthma triggers. It also involves medicines. There are 2 classes of medicine used for asthma treatment:   Controller medicines. These prevent asthma symptoms from occurring. They are usually taken every day.  Reliever or rescue medicines. These quickly relieve asthma symptoms. They are used as needed and provide short-term relief. Your health care provider will help you create an asthma action plan. An asthma action plan is a written plan for managing and treating your asthma attacks. It includes a list of your asthma triggers and how they may be avoided. It also includes information on when medicines should be taken and when their dosage should be changed. An action plan may also involve the use of a device called a peak flow meter. A peak flow meter measures how well the lungs are working. It helps you monitor your condition. HOME CARE INSTRUCTIONS   Take medicines only as directed by your health care provider. Speak with your health care provider if you have questions about how or when to take the medicines.  Use a peak flow meter as directed by your health care provider. Record and keep track of readings.  Understand and use the action plan to help minimize or stop an asthma attack without needing to seek medical care.  Control your home environment in the following ways to help prevent asthma attacks:  Do not smoke. Avoid being exposed to secondhand smoke.  Change your heating and air conditioning filter regularly.  Limit your use of fireplaces and wood stoves.  Get rid of pests (such as roaches   and mice) and their droppings.  Throw away plants if you see mold on them.  Clean your floors and dust regularly. Use unscented cleaning products.  Try to have someone else vacuum for you regularly. Stay out of rooms while they are  being vacuumed and for a short while afterward. If you vacuum, use a dust mask from a hardware store, a double-layered or microfilter vacuum cleaner bag, or a vacuum cleaner with a HEPA filter.  Replace carpet with wood, tile, or vinyl flooring. Carpet can trap dander and dust.  Use allergy-proof pillows, mattress covers, and box spring covers.  Wash bed sheets and blankets every week in hot water and dry them in a dryer.  Use blankets that are made of polyester or cotton.  Clean bathrooms and kitchens with bleach. If possible, have someone repaint the walls in these rooms with mold-resistant paint. Keep out of the rooms that are being cleaned and painted.  Wash hands frequently. SEEK MEDICAL CARE IF:   You have wheezing, shortness of breath, or a cough even if taking medicine to prevent attacks.  The colored mucus you cough up (sputum) is thicker than usual.  Your sputum changes from clear or white to yellow, green, gray, or bloody.  You have any problems that may be related to the medicines you are taking (such as a rash, itching, swelling, or trouble breathing).  You are using a reliever medicine more than 2-3 times per week.  Your peak flow is still at 50-79% of your personal best after following your action plan for 1 hour.  You have a fever. SEEK IMMEDIATE MEDICAL CARE IF:   You seem to be getting worse and are unresponsive to treatment during an asthma attack.  You are short of breath even at rest.  You get short of breath when doing very little physical activity.  You have difficulty eating, drinking, or talking due to asthma symptoms.  You develop chest pain.  You develop a fast heartbeat.  You have a bluish color to your lips or fingernails.  You are light-headed, dizzy, or faint.  Your peak flow is less than 50% of your personal best.   This information is not intended to replace advice given to you by your health care provider. Make sure you discuss any  questions you have with your health care provider.   Document Released: 05/22/2005 Document Revised: 02/10/2015 Document Reviewed: 12/19/2012 Elsevier Interactive Patient Education 2016 [employer]  

## 2015-12-27 ENCOUNTER — Ambulatory Visit: Payer: PRIVATE HEALTH INSURANCE

## 2015-12-30 ENCOUNTER — Ambulatory Visit: Payer: PRIVATE HEALTH INSURANCE

## 2016-02-23 ENCOUNTER — Encounter (HOSPITAL_COMMUNITY): Payer: Self-pay | Admitting: *Deleted

## 2016-02-23 ENCOUNTER — Emergency Department (HOSPITAL_COMMUNITY)
Admission: EM | Admit: 2016-02-23 | Discharge: 2016-02-23 | Disposition: A | Discharge status: Home / Self Care | Payer: Medicaid Other | Attending: Emergency Medicine | Admitting: Emergency Medicine

## 2016-02-23 DIAGNOSIS — J45909 Unspecified asthma, uncomplicated: Secondary | ICD-10-CM | POA: Diagnosis not present

## 2016-02-23 DIAGNOSIS — F419 Anxiety disorder, unspecified: Secondary | ICD-10-CM | POA: Diagnosis present

## 2016-02-23 DIAGNOSIS — F410 Panic disorder [episodic paroxysmal anxiety] without agoraphobia: Secondary | ICD-10-CM | POA: Diagnosis not present

## 2016-02-23 DIAGNOSIS — F17210 Nicotine dependence, cigarettes, uncomplicated: Secondary | ICD-10-CM | POA: Diagnosis not present

## 2016-02-23 LAB — CBC
HCT: 38.9 % (ref 36.0–46.0)
HEMOGLOBIN: 12.9 g/dL (ref 12.0–15.0)
MCH: 29.3 pg (ref 26.0–34.0)
MCHC: 33.2 g/dL (ref 30.0–36.0)
MCV: 88.4 fL (ref 78.0–100.0)
PLATELETS: 277 10*3/uL (ref 150–400)
RBC: 4.4 MIL/uL (ref 3.87–5.11)
RDW: 13.2 % (ref 11.5–15.5)
WBC: 5.1 10*3/uL (ref 4.0–10.5)

## 2016-02-23 LAB — BASIC METABOLIC PANEL
ANION GAP: 5 (ref 5–15)
BUN: 9 mg/dL (ref 6–20)
CALCIUM: 9.2 mg/dL (ref 8.9–10.3)
CO2: 27 mmol/L (ref 22–32)
Chloride: 106 mmol/L (ref 101–111)
Creatinine, Ser: 0.76 mg/dL (ref 0.44–1.00)
Glucose, Bld: 89 mg/dL (ref 65–99)
Potassium: 4.1 mmol/L (ref 3.5–5.1)
SODIUM: 138 mmol/L (ref 135–145)

## 2016-02-23 LAB — I-STAT TROPONIN, ED: TROPONIN I, POC: 0 ng/mL (ref 0.00–0.08)

## 2016-02-23 LAB — HCG, QUANTITATIVE, PREGNANCY: hCG, Beta Chain, Quant, S: <1 m[IU]/mL (ref <5)

## 2016-02-23 MED ORDER — HYDROXYZINE HCL 25 MG PO TABS: 25.0000 mg | ORAL_TABLET | Freq: Three times a day (TID) | ORAL | 0 refills | Status: DC | PRN

## 2016-02-23 NOTE — ED Provider Notes (Signed)
[facility]-EMERGENCY DEPT Provider Note   CSN: [id number] Arrival date & time: 02/23/16  1449     History   Chief Complaint Chief Complaint  Patient presents with  . Anxiety  . Shortness of Breath    HPI Kelly Huang is a 30 y.o. female.  HPI   30 year old female with history of asthma, seizures presenting with sensation of anxiety. 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 The episode lasted for approximately an hour has and has since resolved. She is back to normal baseline. She admits that she has been feeling very stressed out with family life including having to pay for the bills. She started a new job a week ago and started to drink coffee which she normally doesn't drink. She denies having history of anxiety or panic attack in the past. No history of prior PE or DVT, no recent surgery, prolonged bed, unilateral leg swelling or calf pain, active cancer or hemoptysis. No significant family history of premature cardiac death. Her sister has thyroid disease but she denies any history of thyroid disease. She does not take any medication on a regular basis. She denies SI/HI/auditory or visual hallucination. Denies any recent recreational drug use. Patient is a smoker and smoked half a pack a day.  Past Medical History:  Diagnosis Date  . Asthma   . Herniated lumbar intervertebral disc   . Seizures (HCC)     There are no active problems to display for this patient.   History reviewed. No pertinent surgical history.  OB History    No data available       Home Medications    Prior to Admission medications   Medication Sig Start Date End Date Taking? Authorizing Provider  acetaminophen (TYLENOL) 325 MG tablet Take 650 mg by mouth every 6 (six) hours as needed for headache.    Historical  Provider, MD  acetaminophen-codeine (TYLENOL #3) 300-30 MG per tablet Take 1 tablet by mouth every 8 (eight) hours as needed for moderate pain. Patient not taking: Reported on 08/28/2014 03/04/14   Marissa Sciacca, PA-C  albuterol (PROVENTIL HFA;VENTOLIN HFA) 108 (90 Base) MCG/ACT inhaler Inhale 1-2 puffs into the lungs every 6 (six) hours as needed for wheezing or shortness of breath. 09/23/15   Eyvonne Mechanic, PA-C  cephALEXin (KEFLEX) 500 MG capsule Take 1 capsule (500 mg total) by mouth 3 (three) times daily. 12/28/14   Garlon Hatchet, PA-C  cyclobenzaprine (FLEXERIL) 10 MG tablet Take 1 tablet (10 mg total) by mouth 2 (two) times daily as needed for muscle spasms. Patient not taking: Reported on 08/28/2014 03/04/14   Marissa Sciacca, PA-C  meclizine (ANTIVERT) 25 MG tablet Take 1 tablet (25 mg total) by mouth 3 (three) times daily as needed. 12/28/14   Garlon Hatchet, PA-C  ondansetron (ZOFRAN-ODT) 8 MG disintegrating tablet Take 1 tablet (8 mg total) by mouth every 8 (eight) hours as needed for nausea or vomiting. Patient not taking: Reported on 09/21/2014 08/28/14   Benjiman Core, MD    Family History No family history on file.  Social History Social History  Substance Use Topics  . Smoking status: Current Every Day Smoker    Packs/day: 0.50    Types: Cigarettes  . Smokeless tobacco: Never Used  . Alcohol use Yes     Allergies  Bactrim [sulfamethoxazole-trimethoprim]   Review of Systems Review of Systems  All other systems reviewed and are negative.    Physical Exam Updated Vital Signs BP 124/81 (BP Location: Left Arm)   Pulse 64   Temp 98.6 F (37 C) (Oral)   Resp 18   Ht 5' (1.524 m)   Wt 54.4 kg   LMP 01/22/2016   SpO2 99%   BMI 23.44 kg/m   Physical Exam  Constitutional: She is oriented to person, place, and time. She appears well-developed and well-nourished. No distress.  HENT:  Head: Atraumatic.  Eyes: Conjunctivae are normal.  Neck: Neck supple. No  tracheal deviation present. No thyromegaly present.  Cardiovascular: Normal rate and regular rhythm.   Pulmonary/Chest: Effort normal and breath sounds normal.  Abdominal: Soft. There is no tenderness.  Neurological: She is alert and oriented to person, place, and time. GCS eye subscore is 4. GCS verbal subscore is 5. GCS motor subscore is 6.  Skin: No rash noted.  Psychiatric: She has a normal mood and affect. Her speech is normal and behavior is normal. Thought content is not paranoid. She expresses no homicidal and no suicidal ideation.  Nursing note and vitals reviewed.    ED Treatments / Results  Labs (all labs ordered are listed, but only abnormal results are displayed) Labs Reviewed  BASIC METABOLIC PANEL  CBC  HCG, QUANTITATIVE, PREGNANCY  I-STAT TROPOININ, ED    EKG  EKG Interpretation  Date/Time:  Wednesday February 23 2016 15:17:09 EDT Ventricular Rate:  62 PR Interval:    QRS Duration: 100 QT Interval:  368 QTC Calculation: 374 R Axis:   95 Text Interpretation:  Sinus arrhythmia Borderline right axis deviation ST elev, probable normal early repol pattern No significant change since last tracing Abnormal ekg Confirmed by Gerhard MunchLOCKWOOD, ROBERT  MD [phone]([id number]) on 02/23/2016 3:22:50 PM       Radiology No results found.  Procedures Procedures (including critical care time)  Medications Ordered in ED Medications - No data to display   Initial Impression / Assessment and Plan / ED Course  I have reviewed the triage vital signs and the nursing notes.  Pertinent labs & imaging results that were available during my care of the patient were reviewed by me and considered in my medical decision making (see chart for details).  Clinical Course    BP 124/81 (BP Location: Left Arm)   Pulse 64   Temp 98.6 F (37 C) (Oral)   Resp 18   Ht 5' (1.524 m)   Wt 54.4 kg   LMP 01/22/2016   SpO2 99%   BMI 23.44 kg/m    Final Clinical Impressions(s) / ED Diagnoses   Final  diagnoses:  Panic attack    New Prescriptions New Prescriptions   HYDROXYZINE (ATARAX/VISTARIL) 25 MG TABLET    Take 1 tablet (25 mg total) by mouth every 8 (eight) hours as needed for anxiety.   4:12 PM Patient presents with symptoms suggestive of a panic attack. She attributed to increased family stress. She also recently started to drink coffee which may contribute to her symptoms. She is currently at baseline. Her EKG is without acute concerning arrhythmia and no acute ischemic changes.  6:10 PM Her labs are reassuring, a pregnancy test is negative. Patient requests to be discharged at this time. I will provide patient with short course of hydroxyzine for anxiety. Recommend outpatient follow-up. Return precaution discussed.    Fayrene HelperBowie Tiler Brandis, PA-C 02/23/16 1812    Canary Brimhristopher J Tegeler,  MD 02/23/16 2013

## 2016-02-23 NOTE — ED Triage Notes (Signed)
Pt reports she was at work when she started to have tightness in her chest and SOB, tingling in her feet.  Pt states she has a lot of stressors at present.  A&Ox 4.

## 2016-05-03 ENCOUNTER — Emergency Department (HOSPITAL_COMMUNITY)
Admission: EM | Admit: 2016-05-03 | Discharge: 2016-05-03 | Disposition: A | Discharge status: Home / Self Care | Payer: Medicaid Other | Attending: Emergency Medicine | Admitting: Emergency Medicine

## 2016-05-03 ENCOUNTER — Emergency Department (HOSPITAL_COMMUNITY): Payer: Medicaid Other

## 2016-05-03 ENCOUNTER — Encounter (HOSPITAL_COMMUNITY): Payer: Self-pay | Admitting: *Deleted

## 2016-05-03 DIAGNOSIS — S199XXA Unspecified injury of neck, initial encounter: Secondary | ICD-10-CM | POA: Insufficient documentation

## 2016-05-03 DIAGNOSIS — Y939 Activity, unspecified: Secondary | ICD-10-CM | POA: Insufficient documentation

## 2016-05-03 DIAGNOSIS — Z79899 Other long term (current) drug therapy: Secondary | ICD-10-CM | POA: Insufficient documentation

## 2016-05-03 DIAGNOSIS — Y92481 Parking lot as the place of occurrence of the external cause: Secondary | ICD-10-CM | POA: Insufficient documentation

## 2016-05-03 DIAGNOSIS — M62838 Other muscle spasm: Secondary | ICD-10-CM | POA: Diagnosis not present

## 2016-05-03 DIAGNOSIS — J45909 Unspecified asthma, uncomplicated: Secondary | ICD-10-CM | POA: Diagnosis not present

## 2016-05-03 DIAGNOSIS — Y999 Unspecified external cause status: Secondary | ICD-10-CM | POA: Diagnosis not present

## 2016-05-03 DIAGNOSIS — F17210 Nicotine dependence, cigarettes, uncomplicated: Secondary | ICD-10-CM | POA: Diagnosis not present

## 2016-05-03 MED ORDER — CYCLOBENZAPRINE HCL 10 MG PO TABS: 10.0000 mg | ORAL_TABLET | Freq: Two times a day (BID) | ORAL | 0 refills | Status: DC | PRN

## 2016-05-03 MED ORDER — CYCLOBENZAPRINE HCL 10 MG PO TABS
10.0000 mg | ORAL_TABLET | Freq: Once | ORAL | Status: AC
  Administered 2016-05-03: 10 mg via ORAL
  Filled 2016-05-03: qty 1

## 2016-05-03 MED ORDER — NAPROXEN 500 MG PO TABS: 500.0000 mg | ORAL_TABLET | Freq: Two times a day (BID) | ORAL | 0 refills | Status: DC
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
## 2016-05-03 NOTE — ED Notes (Signed)
Patient transported to X-ray 
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
## 2016-07-11 ENCOUNTER — Encounter (HOSPITAL_COMMUNITY): Payer: Self-pay | Admitting: Emergency Medicine

## 2016-07-11 ENCOUNTER — Emergency Department (HOSPITAL_COMMUNITY)
Admission: EM | Admit: 2016-07-11 | Discharge: 2016-07-11 | Disposition: A | Discharge status: Home / Self Care | Payer: BLUE CROSS/BLUE SHIELD | Attending: Dermatology | Admitting: Dermatology

## 2016-07-11 DIAGNOSIS — R05 Cough: Secondary | ICD-10-CM | POA: Insufficient documentation

## 2016-07-11 DIAGNOSIS — Z5321 Procedure and treatment not carried out due to patient leaving prior to being seen by health care provider: Secondary | ICD-10-CM | POA: Insufficient documentation

## 2016-07-11 NOTE — ED Triage Notes (Signed)
PT at Nurse 1ST to reports she was going home. Pt reported she did not want to wait.

## 2016-07-11 NOTE — ED Triage Notes (Signed)
Pt sts productive cough x 5 days; pt sts some fever at times

## 2017-03-08 ENCOUNTER — Encounter (HOSPITAL_COMMUNITY): Payer: Self-pay | Admitting: Emergency Medicine

## 2017-03-08 ENCOUNTER — Emergency Department (HOSPITAL_COMMUNITY)
Admission: EM | Admit: 2017-03-08 | Discharge: 2017-03-08 | Disposition: A | Discharge status: Home / Self Care | Payer: Medicaid Other | Attending: Emergency Medicine | Admitting: Emergency Medicine

## 2017-03-08 DIAGNOSIS — Z5321 Procedure and treatment not carried out due to patient leaving prior to being seen by health care provider: Secondary | ICD-10-CM | POA: Insufficient documentation

## 2017-03-08 DIAGNOSIS — R51 Headache: Secondary | ICD-10-CM | POA: Diagnosis present

## 2017-03-08 LAB — PREGNANCY, URINE: Preg Test, Ur: NEGATIVE

## 2017-03-08 NOTE — ED Triage Notes (Signed)
Pt c/o migraine x 5 days, has tried OTC medications with no relief. Pt is light sensitive, c/o some dizziness on occasion. Also reports generalized body aches since Saturday.

## 2017-03-08 NOTE — ED Notes (Signed)
Pt stopped RN and informed her "I'm still not feeling good, I'm dizzy (pt was standing)"  RN put her in triage and checked vitals which were stable.  Pt stood up and said "I'm going home to sleep through this migraine"  RN incouraged her to return if symptoms worsened.

## 2017-03-16 ENCOUNTER — Emergency Department (HOSPITAL_COMMUNITY): Payer: Medicaid Other

## 2017-03-16 ENCOUNTER — Encounter (HOSPITAL_COMMUNITY): Payer: Self-pay | Admitting: Emergency Medicine

## 2017-03-16 ENCOUNTER — Emergency Department (HOSPITAL_COMMUNITY)
Admission: EM | Admit: 2017-03-16 | Discharge: 2017-03-16 | Disposition: A | Discharge status: Home / Self Care | Payer: Medicaid Other | Attending: Emergency Medicine | Admitting: Emergency Medicine

## 2017-03-16 DIAGNOSIS — G4459 Other complicated headache syndrome: Secondary | ICD-10-CM | POA: Diagnosis not present

## 2017-03-16 DIAGNOSIS — Z79899 Other long term (current) drug therapy: Secondary | ICD-10-CM | POA: Diagnosis not present

## 2017-03-16 DIAGNOSIS — J45909 Unspecified asthma, uncomplicated: Secondary | ICD-10-CM | POA: Diagnosis not present

## 2017-03-16 DIAGNOSIS — F17210 Nicotine dependence, cigarettes, uncomplicated: Secondary | ICD-10-CM | POA: Insufficient documentation

## 2017-03-16 DIAGNOSIS — R51 Headache: Secondary | ICD-10-CM | POA: Diagnosis present

## 2017-03-16 MED ORDER — BUTALBITAL-APAP-CAFFEINE 50-325-40 MG PO TABS: 1.0000 | ORAL_TABLET | Freq: Four times a day (QID) | ORAL | 0 refills | Status: AC | PRN

## 2017-03-16 NOTE — ED Triage Notes (Signed)
Patient with migraine for last month.  Patient is unable to sleep due to the pain.  Patient states she wants an MRI.  Patient does have some nausea with the pain.

## 2017-03-16 NOTE — ED Provider Notes (Signed)
[facility] Provider Note   CSN: [id number] Arrival date & time: 03/16/17  0125     History   Chief Complaint Chief Complaint  Patient presents with  . Migraine    HPI Kelly Huang is a 31 y.o. female.  31 year old female presents with one-month history of frontal headache that is better in the morning and worse throughout the day. No associated emesis or ataxia. No weakness in her arms or legs. No photophobia currently. No recent fevers. Doesn't alcohol use this evening. Denies any visual changes. No tinnitus. States that the headaches come and go and nothing seems to make them better or worse. Has used over-the-counter medications without relief.      Past Medical History:  Diagnosis Date  . Asthma   . Herniated lumbar intervertebral disc   . Seizures (HCC)     There are no active problems to display for this patient.   History reviewed. No pertinent surgical history.  OB History    No data available       Home Medications    Prior to Admission medications   Medication Sig Start Date End Date Taking? Authorizing Provider  albuterol (PROVENTIL HFA;VENTOLIN HFA) 108 (90 Base) MCG/ACT inhaler Inhale 1-2 puffs into the lungs every 6 (six) hours as needed for wheezing or shortness of breath. Patient not taking: Reported on 02/23/2016 09/23/15   Hedges, Tinnie Gens, PA-C  cyclobenzaprine (FLEXERIL) 10 MG tablet Take 1 tablet (10 mg total) by mouth 2 (two) times daily as needed for muscle spasms. 05/03/16   Janne Napoleon, NP  diphenhydramine-acetaminophen (TYLENOL PM) 25-500 MG TABS tablet Take 2 tablets by mouth at bedtime as needed (sleep).    [provider]  hydrOXYzine (ATARAX/VISTARIL) 25 MG tablet Take 1 tablet (25 mg total) by mouth every 8 (eight) hours as needed for anxiety. 02/23/16   Fayrene Helper, PA-C  naproxen (NAPROSYN) 500 MG tablet Take 1 tablet (500 mg total) by mouth 2 (two) times daily. 05/03/16   Janne Napoleon, NP    Family  History No family history on file.  Social History Social History  Substance Use Topics  . Smoking status: Current Every Day Smoker    Packs/day: 0.50    Types: Cigarettes  . Smokeless tobacco: Never Used  . Alcohol use Yes     Allergies   Bactrim [sulfamethoxazole-trimethoprim]   Review of Systems Review of Systems  All other systems reviewed and are negative.    Physical Exam Updated Vital Signs BP 110/74   Pulse 86   Temp 98.6 F (37 C) (Oral)   Resp 16   SpO2 96%   Physical Exam  Constitutional: She is oriented to person, place, and time. She appears well-developed and well-nourished.  Non-toxic appearance. No distress.  HENT:  Head: Normocephalic and atraumatic.  Eyes: Pupils are equal, round, and reactive to light. Conjunctivae, EOM and lids are normal.  Neck: Normal range of motion. Neck supple. No tracheal deviation present. No thyroid mass present.  Cardiovascular: Normal rate, regular rhythm and normal heart sounds.  Exam reveals no gallop.   No murmur heard. Pulmonary/Chest: Effort normal and breath sounds normal. No stridor. No respiratory distress. She has no decreased breath sounds. She has no wheezes. She has no rhonchi. She has no rales.  Abdominal: Soft. Normal appearance and bowel sounds are normal. She exhibits no distension. There is no tenderness. There is no rebound and no CVA tenderness.  Musculoskeletal: Normal range of motion. She exhibits no edema or  tenderness.  Neurological: She is alert and oriented to person, place, and time. She has normal strength. No cranial nerve deficit or sensory deficit. Coordination and gait normal. GCS eye subscore is 4. GCS verbal subscore is 5. GCS motor subscore is 6.  Skin: Skin is warm and dry. No abrasion and no rash noted.  Psychiatric: She has a normal mood and affect. Her speech is normal and behavior is normal.  Nursing note and vitals reviewed.    ED Treatments / Results  Labs (all labs ordered are  listed, but only abnormal results are displayed) Labs Reviewed - No data to display  EKG  EKG Interpretation None       Radiology No results found.  Procedures Procedures (including critical care time)  Medications Ordered in ED Medications - No data to display   Initial Impression / Assessment and Plan / ED Course  I have reviewed the triage vital signs and the nursing notes.  Pertinent labs & imaging results that were available during my care of the patient were reviewed by me and considered in my medical decision making (see chart for details).   head CT negative. Will give referral to neurology for her headaches of one month.  Final Clinical Impressions(s) / ED Diagnoses   Final diagnoses:  None    New Prescriptions New Prescriptions   No medications on file     Lorre Nick, MD 03/16/17 [id number]

## 2017-06-22 ENCOUNTER — Ambulatory Visit: Payer: Self-pay | Admitting: Obstetrics & Gynecology

## 2017-06-22 ENCOUNTER — Other Ambulatory Visit (HOSPITAL_COMMUNITY)
Admission: RE | Admit: 2017-06-22 | Discharge: 2017-06-22 | Disposition: A | Discharge status: Home / Self Care | Payer: Medicaid Other | Source: Ambulatory Visit | Attending: Obstetrics & Gynecology | Admitting: Obstetrics & Gynecology

## 2017-06-22 ENCOUNTER — Encounter: Payer: Self-pay | Admitting: Obstetrics & Gynecology

## 2017-06-22 VITALS — BP 121/79 | HR 72 | Ht 60.0 in | Wt 126.0 lb

## 2017-06-22 DIAGNOSIS — Z0000 Encounter for general adult medical examination without abnormal findings: Secondary | ICD-10-CM | POA: Insufficient documentation

## 2017-06-22 DIAGNOSIS — R21 Rash and other nonspecific skin eruption: Secondary | ICD-10-CM

## 2017-06-22 MED ORDER — CLOTRIMAZOLE-BETAMETHASONE 1-0.05 % EX CREA: 1.0000 "application " | TOPICAL_CREAM | Freq: Two times a day (BID) | CUTANEOUS | 0 refills | Status: DC

## 2017-06-22 NOTE — Progress Notes (Signed)
Pt stated having dry skin, itching outer of the vagina  for about 1 month.

## 2017-06-22 NOTE — Progress Notes (Signed)
Patient ID: Kelly Huang, female   DOB: Feb 07, 1986, 32 y.o.   MRN: [id number]  No chief complaint on file.   HPI Kelly AloeSharonda Tanimoto is a 32 y.o. female. Single P1 LC0 here for the issue of a 1 month of an itchy rash on her mons. She has not tried any over the counter creams or meds.   HPI  Past Medical History:  Diagnosis Date  . Asthma   . Herniated lumbar intervertebral disc   . Seizures (HCC)     No past surgical history on file.  No family history on file.  Social History Social History   Tobacco Use  . Smoking status: Current Every Day Smoker    Packs/day: 0.50    Types: Cigarettes  . Smokeless tobacco: Never Used  Substance Use Topics  . Alcohol use: Yes  . Drug use: No    Allergies  Allergen Reactions  . Bactrim [Sulfamethoxazole-Trimethoprim] Other (See Comments)    Patient is not sure of reaction. She stated that she was having dizzy spells.    Current Outpatient Medications  Medication Sig Dispense Refill  . albuterol (PROVENTIL HFA;VENTOLIN HFA) 108 (90 Base) MCG/ACT inhaler Inhale 1-2 puffs into the lungs every 6 (six) hours as needed for wheezing or shortness of breath. (Patient not taking: Reported on 02/23/2016) 1 Inhaler 0  . butalbital-acetaminophen-caffeine (FIORICET, ESGIC) 50-325-40 MG tablet Take 1-2 tablets by mouth every 6 (six) hours as needed for headache. 20 tablet 0  . cyclobenzaprine (FLEXERIL) 10 MG tablet Take 1 tablet (10 mg total) by mouth 2 (two) times daily as needed for muscle spasms. 20 tablet 0  . diphenhydramine-acetaminophen (TYLENOL PM) 25-500 MG TABS tablet Take 2 tablets by mouth at bedtime as needed (sleep).    . hydrOXYzine (ATARAX/VISTARIL) 25 MG tablet Take 1 tablet (25 mg total) by mouth every 8 (eight) hours as needed for anxiety. 12 tablet 0  . naproxen (NAPROSYN) 500 MG tablet Take 1 tablet (500 mg total) by mouth 2 (two) times daily. 20 tablet 0   No current facility-administered medications for this visit.     Review  of Systems Review of Systems  She declines a flu vaccine. She uses condoms, has been monogamous for about 2 years.  Blood pressure 121/79, pulse 72, height 5' (1.524 m), weight 57.2 kg (126 lb), last menstrual period 06/12/2017.  Physical Exam Physical Exam  Breathing, conversing, and ambulating normally Well nourished, well hydrated Black  female, no apparent distress Her mons has a scaly rash c/w yeast Cervix appears normal Pap smear obtained. Some white vaginal discharge noted   Data Reviewed   Assessment    Preventative care Mons rash    Plan    Pap smear with cotesting, cultures done Trial of lotrisone Come back 4 weeks        Madix Blowe C Lynnox Girten 06/22/2017, 9:39 AM

## 2017-06-26 LAB — CYTOLOGY - PAP
CHLAMYDIA, DNA PROBE: NEGATIVE
Diagnosis: NEGATIVE
HPV: NOT DETECTED
Neisseria Gonorrhea: NEGATIVE

## 2017-07-20 ENCOUNTER — Ambulatory Visit: Payer: Medicaid Other | Admitting: Obstetrics & Gynecology

## 2017-11-11 IMAGING — CT CT HEAD W/O CM
4 series · 16 of 47 positions shown, 18 images · non-contrast
Comparison: CT of the head performed 09/21/2014

CLINICAL DATA: Acute onset of migraine headache. Photosensitivity
and dizziness. Body aches. Initial encounter.

EXAM:
CT HEAD WITHOUT CONTRAST
TECHNIQUE: Contiguous axial images were obtained from the base of the skull
through the vertex without intravenous contrast.

[Series 3: head wo · axial · 0.41mm/px · z∈[-158,-38]mm · 7 of 33 slices shown, 9 images]
[im 5/33  brain]
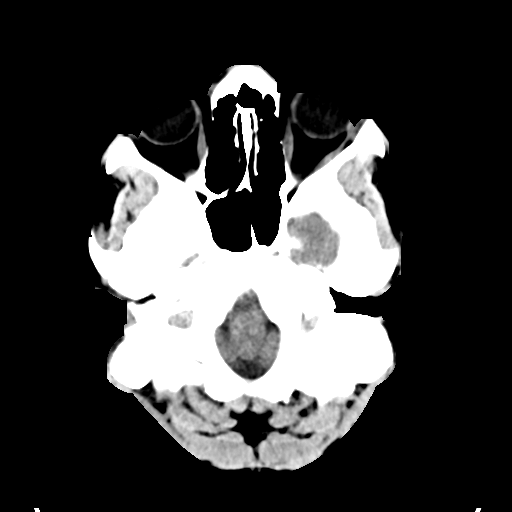
[im 5/33  bone]
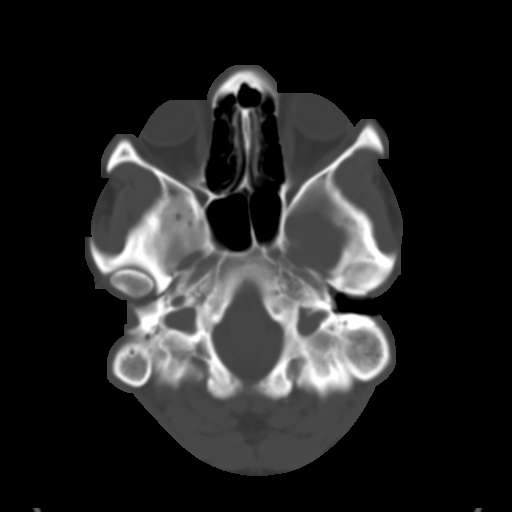
[im 9/33  brain]
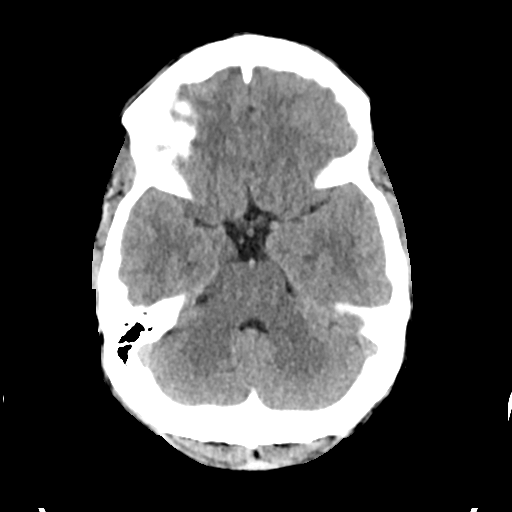
[im 13/33  brain]
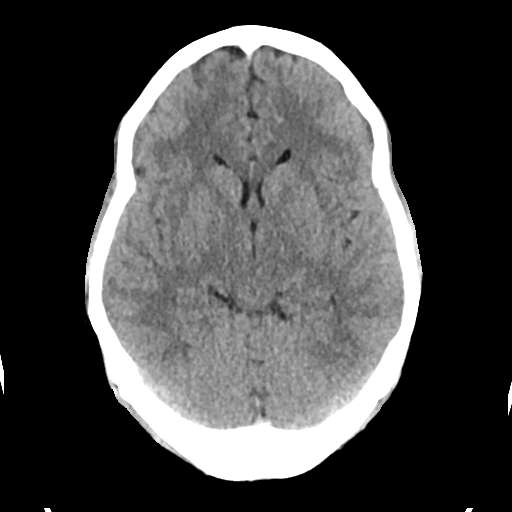
[im 17/33  brain]
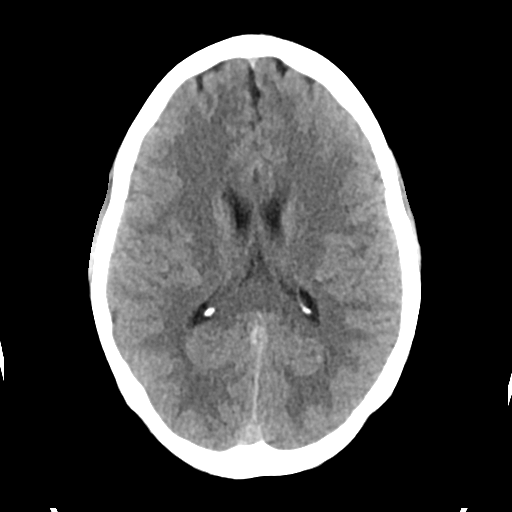
[im 21/33  brain]
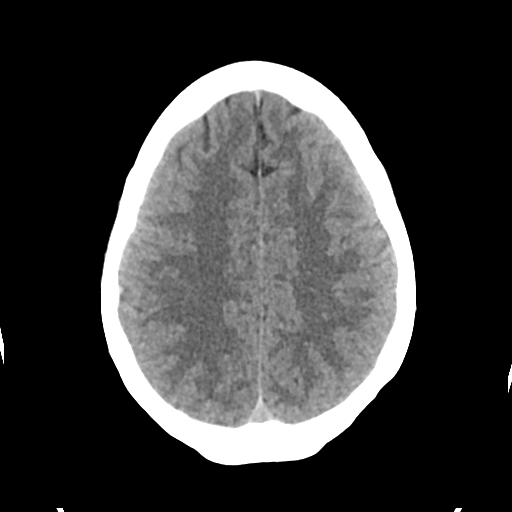
[im 21/33  bone]
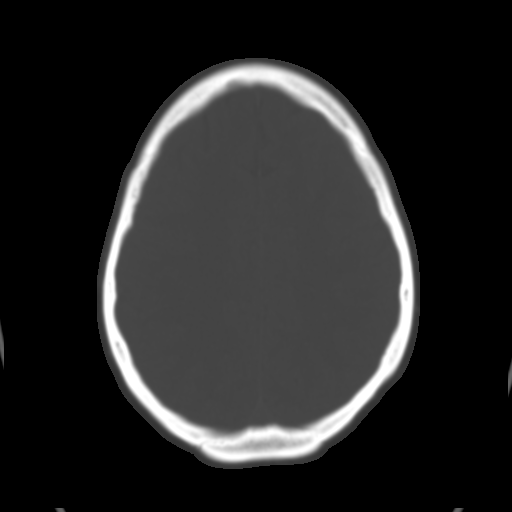
[im 25/33  brain]
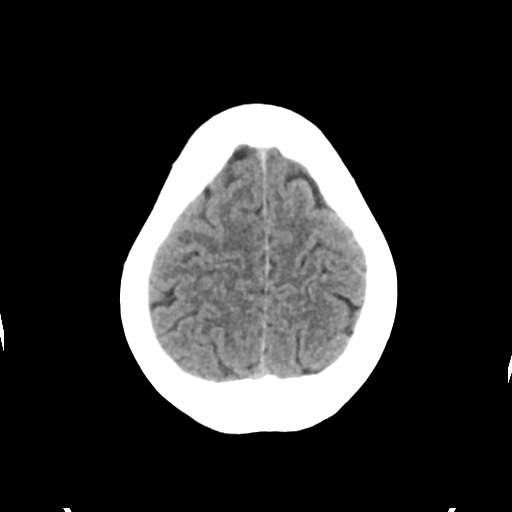
[im 29/33  brain]
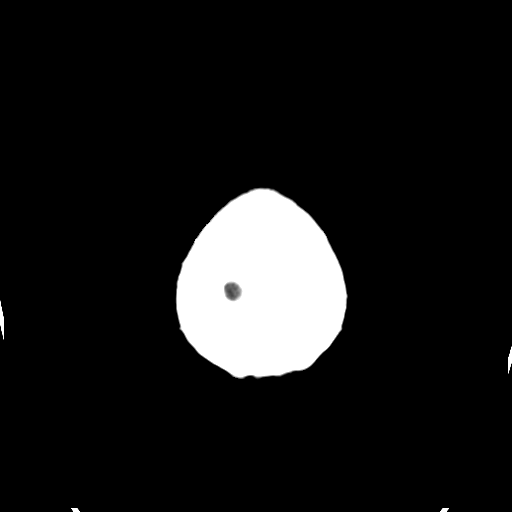

[Series 4: head bone · axial · 0.41mm/px · z∈[-162,-130]mm · 3 of 81 slices shown]
[im 9/81  bone]
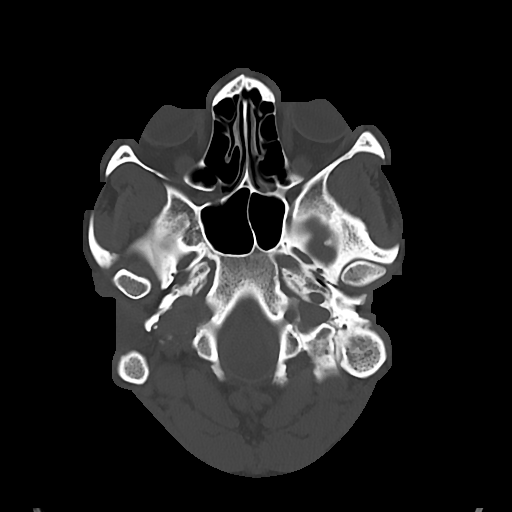
[im 17/81  bone]
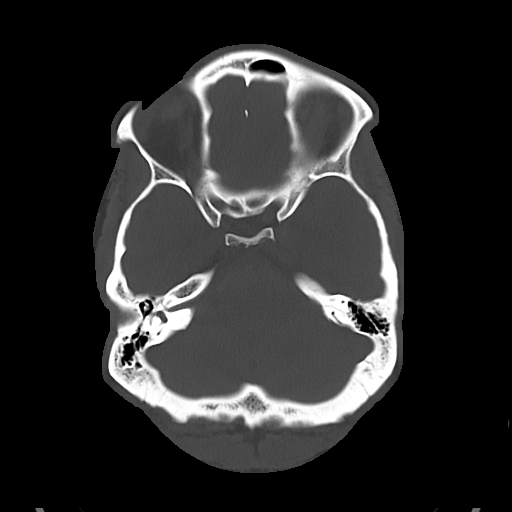
[im 25/81  bone]
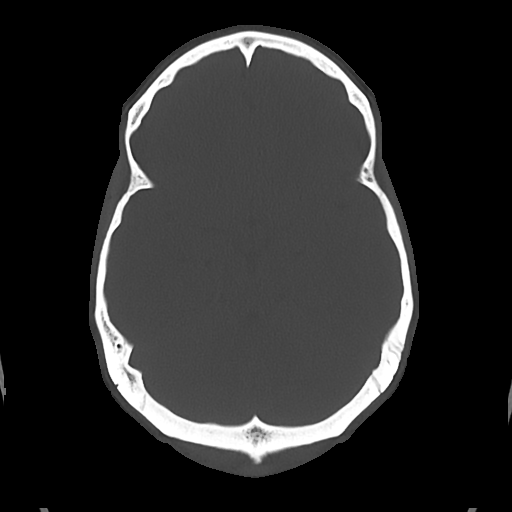

[Series 5: cor soft · coronal · 0.31mm/px · 3 of 73 slices shown]
[im 25/73  brain]
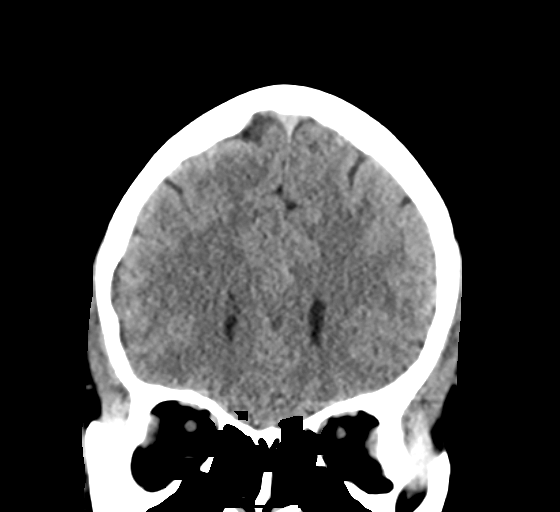
[im 33/73  brain]
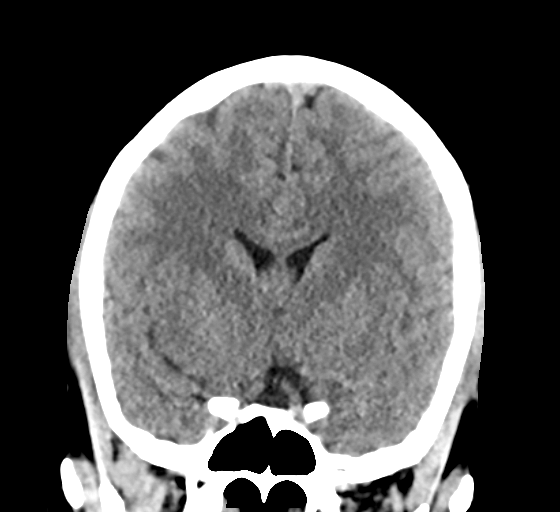
[im 41/73  brain]
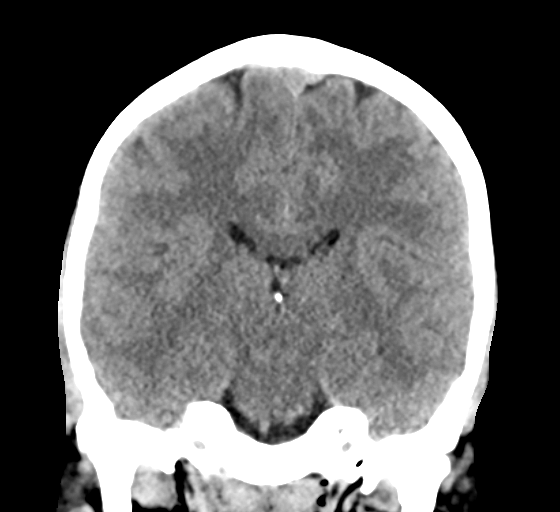

[Series 6: sag soft · sagittal · 0.31mm/px · 3 of 59 slices shown]
[im 20/59  brain]
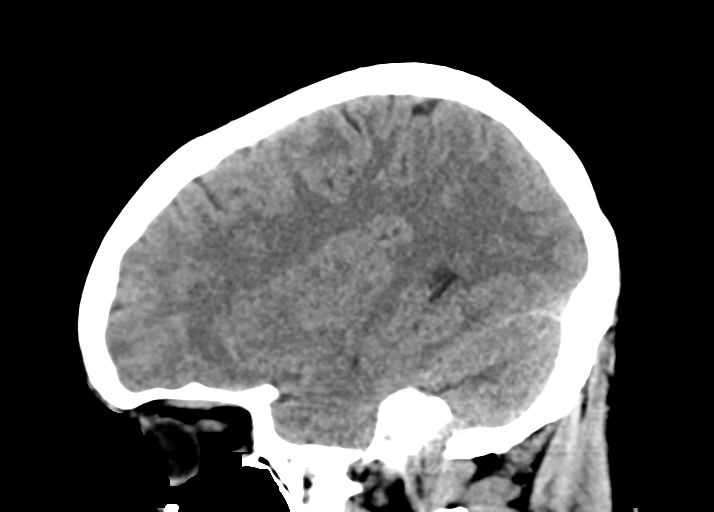
[im 30/59  brain]
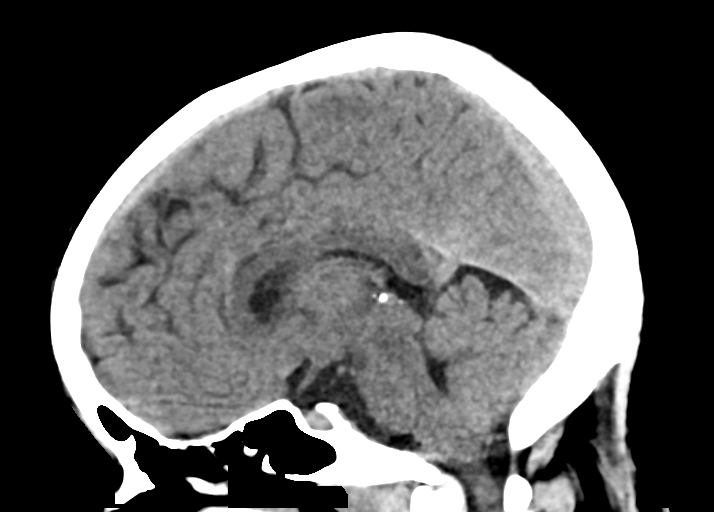
[im 39/59  brain]
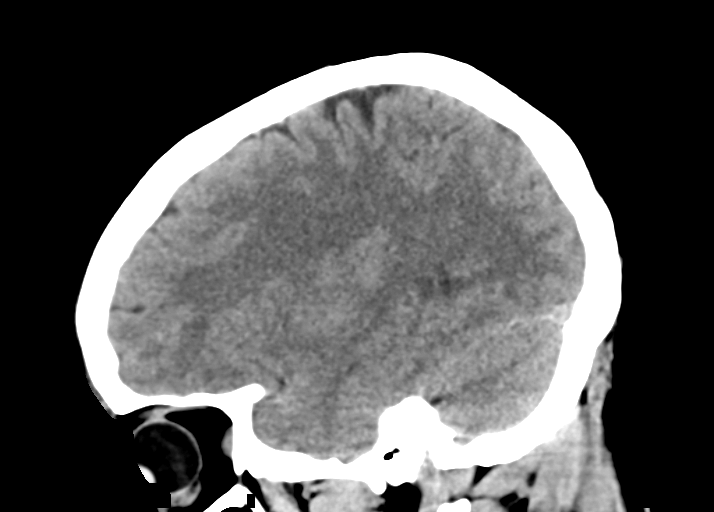

[16 of 47 positions shown; findings below may reference images not displayed]

FINDINGS: Brain: No evidence of acute infarction, hemorrhage, hydrocephalus,
extra-axial collection or mass lesion/mass effect.

The posterior fossa, including the cerebellum, brainstem and fourth
ventricle, is within normal limits. The third and lateral
ventricles, and basal ganglia are unremarkable in appearance. The
cerebral hemispheres are symmetric in appearance, with normal
gray-white differentiation. No mass effect or midline shift is seen.

Vascular: No hyperdense vessel or unexpected calcification.

Skull: There is no evidence of fracture; visualized osseous
structures are unremarkable in appearance.

Sinuses/Orbits: The orbits are within normal limits. The paranasal
sinuses and mastoid air cells are well-aerated.

Other: No significant soft tissue abnormalities are seen.
IMPRESSION: Unremarkable noncontrast CT of the head.

## 2018-01-09 ENCOUNTER — Encounter (HOSPITAL_COMMUNITY): Payer: Self-pay | Admitting: *Deleted

## 2018-01-09 ENCOUNTER — Other Ambulatory Visit: Payer: Self-pay

## 2018-01-09 ENCOUNTER — Emergency Department (HOSPITAL_COMMUNITY): Payer: Self-pay

## 2018-01-09 ENCOUNTER — Emergency Department (HOSPITAL_COMMUNITY)
Admission: EM | Admit: 2018-01-09 | Discharge: 2018-01-09 | Disposition: A | Discharge status: Home / Self Care | Payer: Self-pay | Attending: Emergency Medicine | Admitting: Emergency Medicine

## 2018-01-09 DIAGNOSIS — Z79899 Other long term (current) drug therapy: Secondary | ICD-10-CM | POA: Insufficient documentation

## 2018-01-09 DIAGNOSIS — B9789 Other viral agents as the cause of diseases classified elsewhere: Secondary | ICD-10-CM | POA: Insufficient documentation

## 2018-01-09 DIAGNOSIS — J45909 Unspecified asthma, uncomplicated: Secondary | ICD-10-CM | POA: Insufficient documentation

## 2018-01-09 DIAGNOSIS — J069 Acute upper respiratory infection, unspecified: Secondary | ICD-10-CM | POA: Insufficient documentation

## 2018-01-09 DIAGNOSIS — F17210 Nicotine dependence, cigarettes, uncomplicated: Secondary | ICD-10-CM | POA: Insufficient documentation

## 2018-01-09 NOTE — ED Provider Notes (Signed)
[facility]   CSN: [id number] Arrival date & time: 01/09/18  [id number]     History   Chief Complaint Chief Complaint  Patient presents with  . Cough    HPI Kelly Huang is a 32 y.o. female who presents the emergency department with 3 days of cough.  Patient complains of painful, productive cough of clear sputum.  Her mom has the same symptoms.  She denies fever, chills, neck stiffness, rash, headache.  She is a daily smoker.  She denies shortness of breath or wheezing.  The patient took TheraFlu and denies that her symptoms improved at all.  HPI  Past Medical History:  Diagnosis Date  . Asthma   . Herniated lumbar intervertebral disc   . Seizures (HCC)     There are no active problems to display for this patient.   History reviewed. No pertinent surgical history.   OB History   None      Home Medications    Prior to Admission medications   Medication Sig Start Date End Date Taking? Authorizing Provider  albuterol (PROVENTIL HFA;VENTOLIN HFA) 108 (90 Base) MCG/ACT inhaler Inhale 1-2 puffs into the lungs every 6 (six) hours as needed for wheezing or shortness of breath. Patient not taking: Reported on 02/23/2016 09/23/15   Hedges, Tinnie GensJeffrey, PA-C  butalbital-acetaminophen-caffeine (FIORICET, ESGIC) 701-095-290050-325-40 MG tablet Take 1-2 tablets by mouth every 6 (six) hours as needed for headache. 03/16/17 03/16/18  Lorre NickAllen, Anthony, MD  clotrimazole-betamethasone (LOTRISONE) cream Apply 1 application topically 2 (two) times daily. 06/22/17   Allie Bossierove, Myra C, MD  cyclobenzaprine (FLEXERIL) 10 MG tablet Take 1 tablet (10 mg total) by mouth 2 (two) times daily as needed for muscle spasms. 05/03/16   Janne NapoleonNeese, Hope M, NP  diphenhydramine-acetaminophen (TYLENOL PM) 25-500 MG TABS tablet Take 2 tablets by mouth at bedtime as needed (sleep).    [provider]  hydrOXYzine (ATARAX/VISTARIL) 25 MG tablet Take 1 tablet (25 mg total) by mouth  every 8 (eight) hours as needed for anxiety. 02/23/16   Fayrene Helperran, Bowie, PA-C  naproxen (NAPROSYN) 500 MG tablet Take 1 tablet (500 mg total) by mouth 2 (two) times daily. 05/03/16   Janne NapoleonNeese, Hope M, NP    Family History No family history on file.  Social History Social History   Tobacco Use  . Smoking status: Current Every Day Smoker    Packs/day: 0.50    Types: Cigarettes  . Smokeless tobacco: Never Used  Substance Use Topics  . Alcohol use: Yes  . Drug use: No     Allergies   Bactrim [sulfamethoxazole-trimethoprim]   Review of Systems Review of Systems Ten systems reviewed and are negative for acute change, except as noted in the HPI.    Physical Exam Updated Vital Signs BP 94/78 (BP Location: Right Arm)   Pulse 87   Temp 98.6 F (37 C) (Oral)   Resp 16   LMP 12/10/2017 (Approximate)   SpO2 100%   Physical Exam Physical Exam  Nursing note and vitals reviewed. Constitutional: She is oriented to person, place, and time. She appears well-developed and well-nourished. No distress.  HENT:  Head: Normocephalic and atraumatic.  Eyes: Conjunctivae normal and EOM are normal. Pupils are equal, round, and reactive to light. No scleral icterus.  Mouth and throat: Oropharynx clear moist uvula midline no cervical adenopathy Neck: Normal range of motion.  Cardiovascular: Normal rate, regular rhythm and normal heart sounds.  Exam reveals no gallop and no friction rub.  No murmur heard. Pulmonary/Chest: Effort normal and breath sounds normal. No respiratory distress.  Abdominal: Soft. Bowel sounds are normal. She exhibits no distension and no mass. There is no tenderness. There is no guarding.  Neurological: She is alert and oriented to person, place, and time.  Skin: Skin is warm and dry. She is not diaphoretic.     ED Treatments / Results  Labs (all labs ordered are listed, but only abnormal results are displayed) Labs Reviewed - No data to  display  EKG None  Radiology Dg Chest 2 View  Result Date: 01/09/2018 CLINICAL DATA:  Cough with cold and chills for 3 days EXAM: CHEST - 2 VIEW COMPARISON:  None. FINDINGS: Normal heart size and mediastinal contours. Clear lungs. No effusion or pneumothorax. Thoracic dextrocurvature, known from dedicated spinal radiography in 2015. IMPRESSION: No evidence of pneumonia. Electronically Signed   By: Marnee Spring M.D.   On: 01/09/2018 08:41    Procedures Procedures (including critical care time)  Medications Ordered in ED Medications - No data to display   Initial Impression / Assessment and Plan / ED Course  I have reviewed the triage vital signs and the nursing notes.  Pertinent labs & imaging results that were available during my care of the patient were reviewed by me and considered in my medical decision making (see chart for details).     Pt CXR negative for acute infiltrate.  I personally reviewed the PA and lateral chest x-ray which shows no consolidation.  I agree with the radiologic interpretation.  Patients symptoms are consistent with URI, likely viral etiology. Discussed that antibiotics are not indicated for viral infections. Pt will be discharged with symptomatic treatment.  Verbalizes understanding and is agreeable with plan. Pt is hemodynamically stable & in NAD prior to dc.   Final Clinical Impressions(s) / ED Diagnoses   Final diagnoses:  Viral URI with cough    ED Discharge Orders    None       Arthor Captain, PA-C 01/09/18 1610    Cathren Laine, MD 01/09/18 1328

## 2018-01-09 NOTE — ED Triage Notes (Signed)
Pt c/o productive cough with yellow sputum onset x 3 days, mid CP only with cough, denies fever and chills, A&O x4

## 2018-01-09 NOTE — Discharge Instructions (Signed)
You appear to have an upper respiratory infection (URI). An upper respiratory tract infection, or cold, is a viral infection of the air passages leading to the lungs. It is contagious and can be spread to others, especially during the first 3 or 4 days. It cannot be cured by antibiotics or other medicines. °RETURN IMMEDIATELY IF you develop shortness of breath, confusion or altered mental status, a new rash, become dizzy, faint, or poorly responsive, or are unable to be cared for at home. ° °

## 2018-09-06 IMAGING — DX DG CHEST 2V
2 series · 2 of 2 positions shown · non-contrast
Comparison: None.

CLINICAL DATA: Cough with cold and chills for 3 days

EXAM:
CHEST - 2 VIEW

[chest pa]
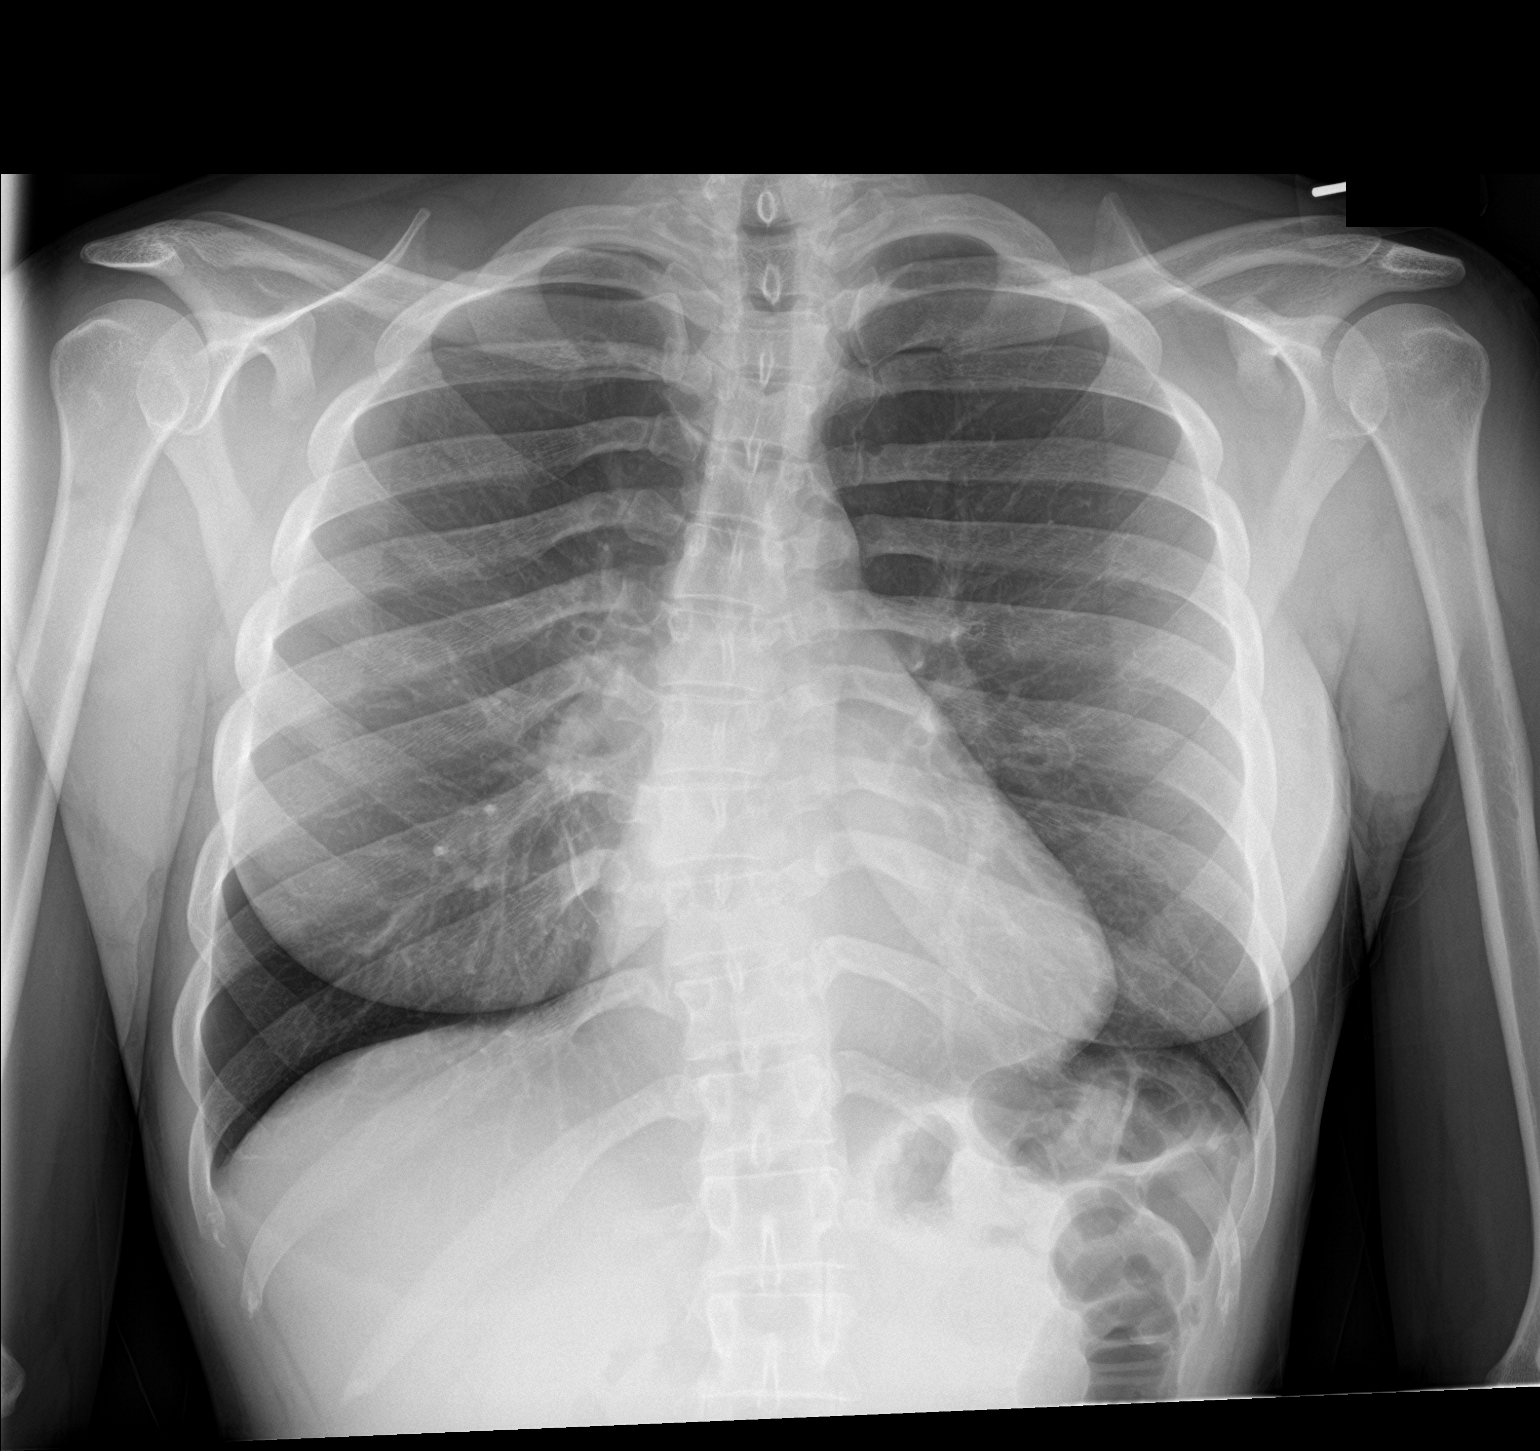

[chest lat]
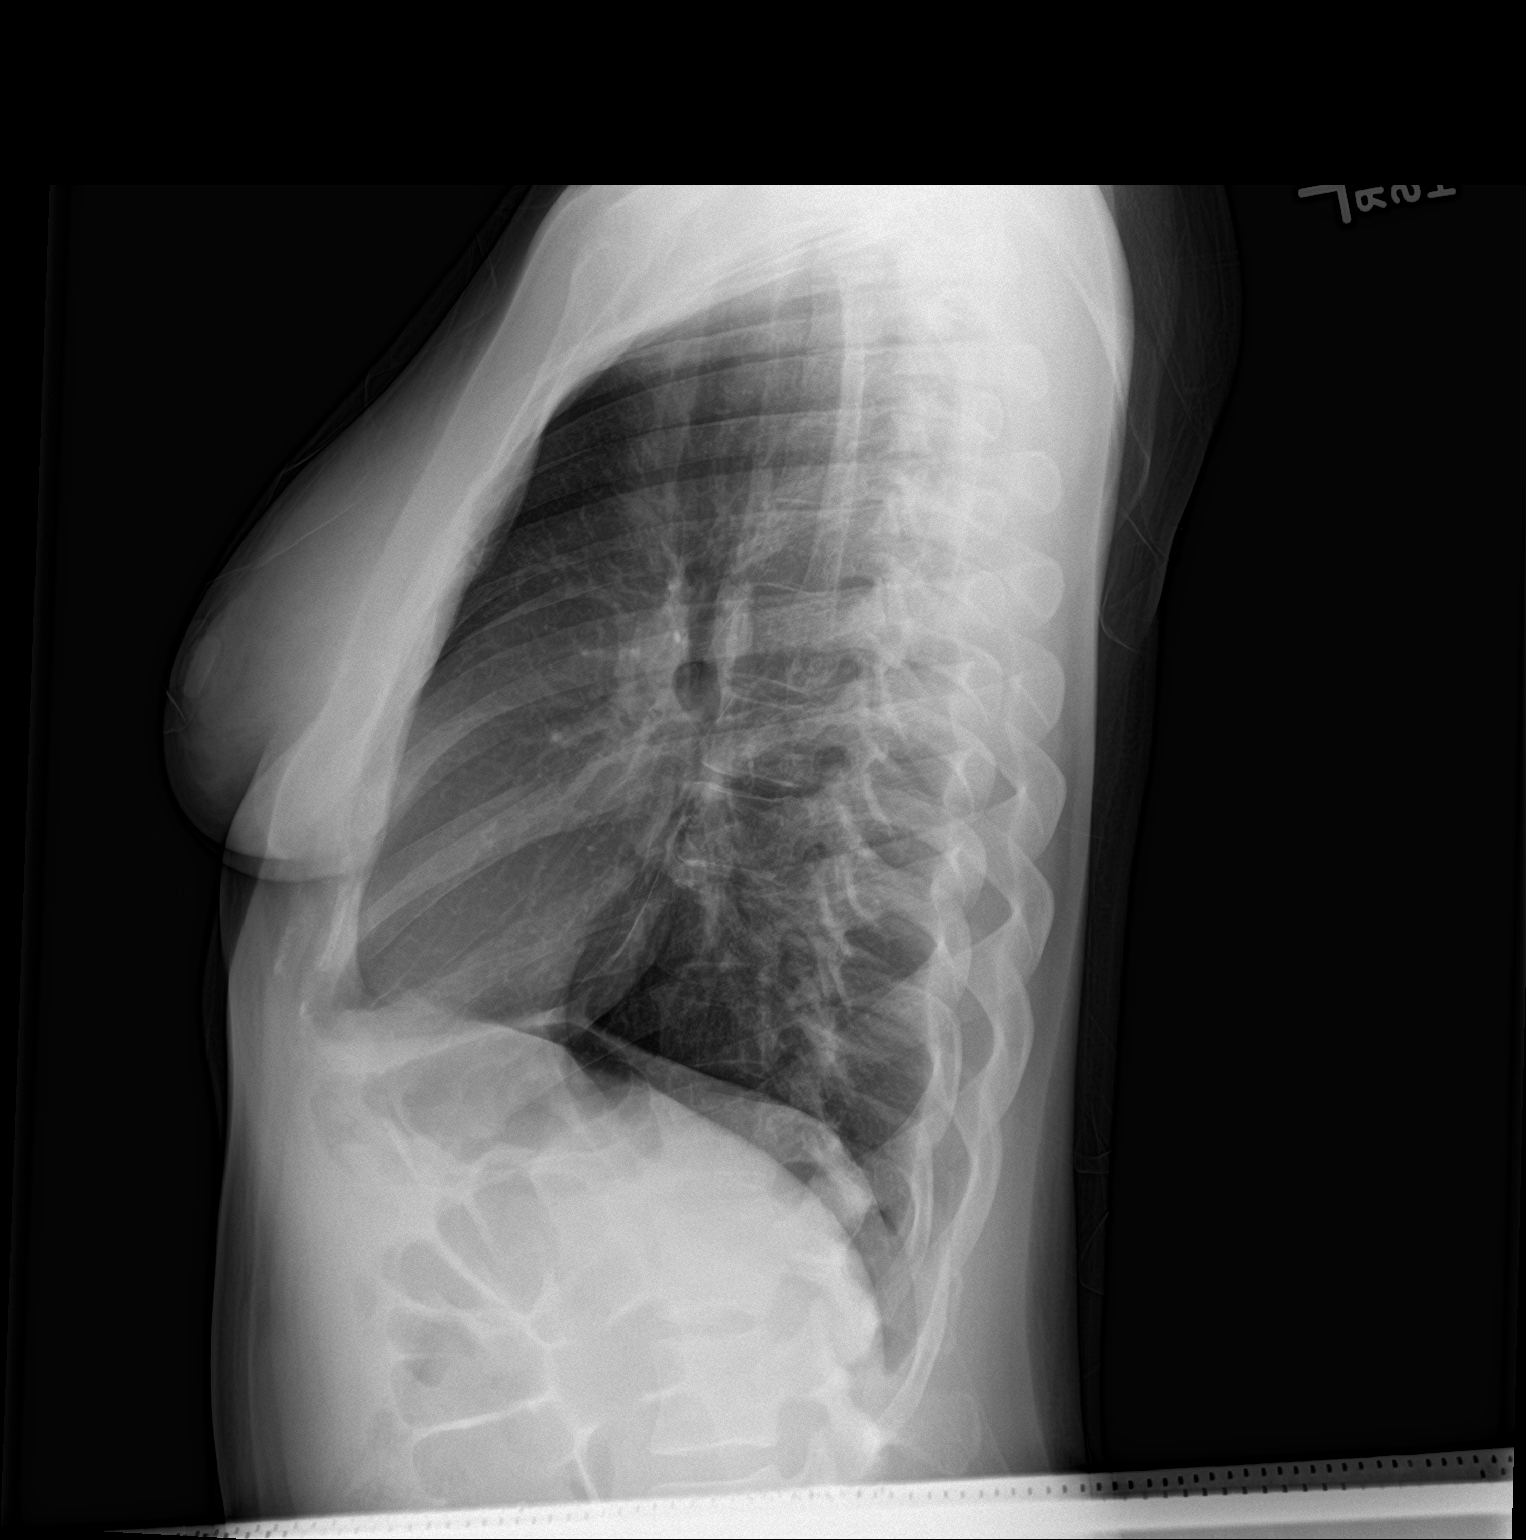

[2 of 2 positions shown; findings below may reference images not displayed]

FINDINGS: Normal heart size and mediastinal contours. Clear lungs. No effusion
or pneumothorax. Thoracic dextrocurvature, known from dedicated
spinal radiography in 9987.
IMPRESSION: No evidence of pneumonia.

## 2018-12-28 ENCOUNTER — Encounter (HOSPITAL_COMMUNITY): Payer: Self-pay

## 2018-12-28 ENCOUNTER — Ambulatory Visit (HOSPITAL_COMMUNITY)
Admission: EM | Admit: 2018-12-28 | Discharge: 2018-12-28 | Disposition: A | Discharge status: Home / Self Care | Payer: Medicaid Other | Attending: Family Medicine | Admitting: Family Medicine

## 2018-12-28 ENCOUNTER — Other Ambulatory Visit: Payer: Self-pay

## 2018-12-28 DIAGNOSIS — IMO0002 Reserved for concepts with insufficient information to code with codable children: Secondary | ICD-10-CM

## 2018-12-28 DIAGNOSIS — G43709 Chronic migraine without aura, not intractable, without status migrainosus: Secondary | ICD-10-CM

## 2018-12-28 MED ORDER — DEXAMETHASONE SODIUM PHOSPHATE 10 MG/ML IJ SOLN
INTRAMUSCULAR | Status: AC
  Filled 2018-12-28: qty 1

## 2018-12-28 MED ORDER — KETOROLAC TROMETHAMINE 60 MG/2ML IM SOLN
INTRAMUSCULAR | Status: AC
  Filled 2018-12-28: qty 2

## 2018-12-28 MED ORDER — DEXAMETHASONE SODIUM PHOSPHATE 10 MG/ML IJ SOLN
10.0000 mg | Freq: Once | INTRAMUSCULAR | Status: AC
  Administered 2018-12-28: 10 mg via INTRAMUSCULAR

## 2018-12-28 MED ORDER — METOCLOPRAMIDE HCL 5 MG/ML IJ SOLN
INTRAMUSCULAR | Status: AC
  Filled 2018-12-28: qty 2

## 2018-12-28 MED ORDER — METOCLOPRAMIDE HCL 5 MG/ML IJ SOLN
5.0000 mg | Freq: Once | INTRAMUSCULAR | Status: AC
  Administered 2018-12-28: 5 mg via INTRAMUSCULAR

## 2018-12-28 MED ORDER — BUTALBITAL-APAP-CAFFEINE 50-325-40 MG PO TABS: 1.0000 | ORAL_TABLET | Freq: Four times a day (QID) | ORAL | 0 refills | Status: AC | PRN

## 2018-12-28 MED ORDER — KETOROLAC TROMETHAMINE 60 MG/2ML IM SOLN
60.0000 mg | Freq: Once | INTRAMUSCULAR | Status: AC
  Administered 2018-12-28: 60 mg via INTRAMUSCULAR

## 2018-12-28 NOTE — ED Provider Notes (Signed)
[facility]    CSN: [id number] Arrival date & time: 12/28/18  1057      History   Chief Complaint Chief Complaint  Patient presents with  . Migraine    HPI Kelly Huang is a 33 y.o. female.   HPI  Headache: Patient complains of headache. She does have a headache at this time. Reports a history of chronic headaches  and has in the past seen a neurologist out of state for evaluation of this problem. HA is bilateral. 4/10 at present. Started less than 24 hours ago. She has taken Tylenol and Ibuprofen without significant relief of HA pain. She is scheduled for work today and doesn't feel that she will be able go to work today. Denies currently experiencing changes in vision, dizziness, or weakness. Patient has a history of seizures and reports no recent seizure activity. She is not currently prescribed antiepileptic medication.  Past Medical History:  Diagnosis Date  . Asthma   . Herniated lumbar intervertebral disc   . Seizures (HCC)      OB History   No obstetric history on file.      Home Medications    Prior to Admission medications   Medication Sig Start Date End Date Taking? Authorizing Provider  albuterol (PROVENTIL HFA;VENTOLIN HFA) 108 (90 Base) MCG/ACT inhaler Inhale 1-2 puffs into the lungs every 6 (six) hours as needed for wheezing or shortness of breath. Patient not taking: Reported on 02/23/2016 09/23/15   Eyvonne MechanicHedges, Jeffrey, PA-C  clotrimazole-betamethasone (LOTRISONE) cream Apply 1 application topically 2 (two) times daily. 06/22/17   Allie Bossierove, Myra C, MD  cyclobenzaprine (FLEXERIL) 10 MG tablet Take 1 tablet (10 mg total) by mouth 2 (two) times daily as needed for muscle spasms. 05/03/16   Janne NapoleonNeese, Hope M, NP  diphenhydramine-acetaminophen (TYLENOL PM) 25-500 MG TABS tablet Take 2 tablets by mouth at bedtime as needed (sleep).    [provider]  hydrOXYzine (ATARAX/VISTARIL) 25 MG tablet Take 1 tablet (25 mg total) by mouth every 8 (eight) hours as  needed for anxiety. 02/23/16   Fayrene Helperran, Bowie, PA-C  naproxen (NAPROSYN) 500 MG tablet Take 1 tablet (500 mg total) by mouth 2 (two) times daily. 05/03/16   Janne NapoleonNeese, Hope M, NP    Family History History reviewed. No pertinent family history.  Social History Social History   Tobacco Use  . Smoking status: Current Every Day Smoker    Packs/day: 0.50    Types: Cigarettes  . Smokeless tobacco: Never Used  Substance Use Topics  . Alcohol use: Yes  . Drug use: No     Allergies   Bactrim [sulfamethoxazole-trimethoprim]   Review of Systems Review of Systems Pertinent negatives listed in HPI Physical Exam Triage Vital Signs ED Triage Vitals  Enc Vitals Group     BP 12/28/18 1126 113/82     Pulse Rate 12/28/18 1126 76     Resp 12/28/18 1126 18     Temp 12/28/18 1126 99.3 F (37.4 C)     Temp Source 12/28/18 1126 Oral     SpO2 12/28/18 1126 100 %     Weight 12/28/18 1125 130 lb (59 kg)     Height --      Head Circumference --      Peak Flow --      Pain Score 12/28/18 1124 5     Pain Loc --      Pain Edu? --      Excl. in GC? --    No  data found.  Updated Vital Signs BP 113/82 (BP Location: Right Arm)   Pulse 76   Temp 99.3 F (37.4 C) (Oral)   Resp 18   Wt 130 lb (59 kg)   LMP 12/07/2018   SpO2 100%   BMI 25.39 kg/m   Visual Acuity Right Eye Distance:   Left Eye Distance:   Bilateral Distance:    Right Eye Near:   Left Eye Near:    Bilateral Near:     Physical Exam Constitutional: Patient appears well-developed and well-nourished. No distress. HENT: Normocephalic, atraumatic.  Eyes: Conjunctivae and EOM are normal. PERRLA, no scleral icterus. CVS: RRR, S1/S2 +, no murmurs, no gallops, no carotid bruit.  Pulmonary: Effort and breath sounds normal, no stridor, rhonchi, wheezes, rales.  Abdominal: Soft. BS +, no distension, tenderness, rebound or guarding.  Musculoskeletal: Normal range of motion. No edema and no tenderness.  Neuro: Alert. Normal reflexes,  muscle tone coordination. No cranial nerve deficit. Skin: Skin is warm and dry. No rash noted. Not diaphoretic. No erythema. No pallor. Psychiatric: Normal mood and affect. Behavior, judgment, thought content normal. UC Treatments / Results  Labs (all labs ordered are listed, but only abnormal results are displayed) Labs Reviewed - No data to display  EKG   Radiology No results found.  Procedures Procedures (including critical care time)  Medications Ordered in UC Medications  ketorolac (TORADOL) injection 60 mg (60 mg Intramuscular Given 12/28/18 1249)  metoCLOPramide (REGLAN) injection 5 mg (5 mg Intramuscular Given 12/28/18 1251)  dexamethasone (DECADRON) injection 10 mg (10 mg Intramuscular Given 12/28/18 1250)  dexamethasone (DECADRON) 10 MG/ML injection (has no administration in time range)  ketorolac (TORADOL) 60 MG/2ML injection (has no administration in time range)  metoCLOPramide (REGLAN) 5 MG/ML injection (has no administration in time range)    Initial Impression / Assessment and Plan / UC Course  I have reviewed the triage vital signs and the nursing notes.  Pertinent labs & imaging results that were available during my care of the patient were reviewed by me and considered in my medical decision making (see chart for details).    Chronic migraine without status migrainous. Treating acute headache with HA cocktail. For acute HA will trial PRN Fioricet which patient has tolerated in the past. Recommended follow-up with PCP and request a referral to neurology. Work note provided. Patient verbalized agreement and understanding of plan. Final Clinical Impressions(s) / UC Diagnoses   Final diagnoses:  Chronic migraine   Discharge Instructions   None    ED Prescriptions    Medication Sig Dispense Auth. Provider   butalbital-acetaminophen-caffeine (FIORICET) 50-325-40 MG tablet Take 1-2 tablets by mouth every 6 (six) hours as needed for headache. 20 tablet Scot Jun, FNP     Controlled Substance Prescriptions [place] Controlled Substance Registry consulted? Not Applicable   Scot Jun, FNP 12/30/18 1030

## 2018-12-28 NOTE — ED Triage Notes (Signed)
Pt states she has a migraine that started this morning.

## 2020-11-26 DIAGNOSIS — Z3009 Encounter for other general counseling and advice on contraception: Secondary | ICD-10-CM | POA: Diagnosis not present

## 2020-11-26 DIAGNOSIS — Z1388 Encounter for screening for disorder due to exposure to contaminants: Secondary | ICD-10-CM | POA: Diagnosis not present

## 2020-11-26 DIAGNOSIS — Z0389 Encounter for observation for other suspected diseases and conditions ruled out: Secondary | ICD-10-CM | POA: Diagnosis not present

## 2021-06-05 NOTE — L&D Delivery Note (Addendum)
[facility] Delivery Note  Kelly Huang is a 36 y.o. G2P0100 s/p SVD at [redacted]w[redacted]d. She was admitted for preterm contractions.   ROM: 0h 70m with meconium stained fluid GBS Status: unknown   Maximum Maternal Temperature: afebrile  Labor Progress: Initial SVE: 2-3cm dilated. She then progressed to complete.   Delivery Date/Time: 04/26/22 1306 Delivery: Called to room after patient had SROM with increasing pressure and bleeding. The patient was checked and she was complete. The patient pushed once and a viable baby boy delivered, OA. Infant without spontaneous cry, placed on mother's abdomen. Cord clamped x 2 immediately and cut by me. Cord blood drawn. Placenta delivered spontaneously with blood clot, presumed abruption. Fundus firm with massage and Pitocin. Labia, perineum, vagina, and cervix inspected inspected with no lacerations.  Baby Weight: pending  Placenta: Sent to pathology Complications: None\ Lacerations: none EBL: 250 mL Analgesia: none   Infant:  APGAR (1 MIN):  pending APGAR (5 MINS):  pending APGAR (10 MINS):     [place] [facility] [place] 03/26/2022, 1:33 PM

## 2022-01-08 ENCOUNTER — Encounter (HOSPITAL_COMMUNITY): Payer: Self-pay

## 2022-01-08 ENCOUNTER — Other Ambulatory Visit: Payer: Self-pay

## 2022-01-08 ENCOUNTER — Emergency Department (HOSPITAL_COMMUNITY)
Admission: EM | Admit: 2022-01-08 | Discharge: 2022-01-08 | Discharge status: Against Medical Advice | Payer: Medicaid Other | Attending: Student | Admitting: Student

## 2022-01-08 DIAGNOSIS — Z5321 Procedure and treatment not carried out due to patient leaving prior to being seen by health care provider: Secondary | ICD-10-CM | POA: Diagnosis not present

## 2022-01-08 DIAGNOSIS — R1031 Right lower quadrant pain: Secondary | ICD-10-CM | POA: Diagnosis not present

## 2022-01-08 LAB — URINALYSIS, ROUTINE W REFLEX MICROSCOPIC
Bacteria, UA: NONE SEEN
Bilirubin Urine: NEGATIVE
Glucose, UA: NEGATIVE mg/dL
Ketones, ur: 20 mg/dL — AB
Nitrite: POSITIVE — AB
Protein, ur: 100 mg/dL — AB
Specific Gravity, Urine: 1.012 (ref 1.005–1.030)
WBC, UA: >50 WBC/hpf — ABNORMAL HIGH (ref 0–5)
pH: 5 (ref 5.0–8.0)

## 2022-01-08 LAB — POC URINE PREG, ED: Preg Test, Ur: POSITIVE — AB

## 2022-01-08 NOTE — ED Notes (Signed)
Pt witness leaving.

## 2022-01-08 NOTE — ED Triage Notes (Signed)
Patient reports she hasnt had a period in 2 months needing a preg test. Also complains of right flank pain with no relief from home remedies. Patient admits to alcohol and cocaine use.

## 2022-01-08 NOTE — ED Notes (Signed)
Pt seen walking outside  

## 2022-01-08 NOTE — ED Provider Notes (Signed)
Patient was briefly seen in triage, vital stable and she was ambulatory, complaining of right flank pain.  Patient also reported that she had not had a menstrual cycle in 2 months.  Patient was placed in hallway bed, went to see and evaluate patient and she was nowhere to be found.  Tach in waiting room noted that patient walked out from the department and then was seen leaving the premises.  Received note from lab patient's pregnancy test is positive.  I have attempted to call patient to notify her of results and encouraged her to return to the hospital, unable to reach patient by phone.   Dartha Lodge, PA-C 01/08/22 1532    Mancel Bale, MD 01/08/22 2109

## 2022-01-08 NOTE — ED Notes (Signed)
Pt did not answer for room, no response.

## 2022-01-12 ENCOUNTER — Emergency Department (HOSPITAL_COMMUNITY): Payer: Medicaid Other

## 2022-01-12 ENCOUNTER — Encounter (HOSPITAL_COMMUNITY): Payer: Self-pay

## 2022-01-12 ENCOUNTER — Other Ambulatory Visit: Payer: Self-pay

## 2022-01-12 ENCOUNTER — Emergency Department (HOSPITAL_COMMUNITY)
Admission: EM | Admit: 2022-01-12 | Discharge: 2022-01-12 | Disposition: A | Discharge status: Home / Self Care | Payer: Medicaid Other | Attending: Emergency Medicine | Admitting: Emergency Medicine

## 2022-01-12 DIAGNOSIS — N1330 Unspecified hydronephrosis: Secondary | ICD-10-CM

## 2022-01-12 DIAGNOSIS — O26831 Pregnancy related renal disease, first trimester: Secondary | ICD-10-CM | POA: Diagnosis not present

## 2022-01-12 DIAGNOSIS — N390 Urinary tract infection, site not specified: Secondary | ICD-10-CM

## 2022-01-12 DIAGNOSIS — Z3A13 13 weeks gestation of pregnancy: Secondary | ICD-10-CM | POA: Diagnosis not present

## 2022-01-12 DIAGNOSIS — Z3A12 12 weeks gestation of pregnancy: Secondary | ICD-10-CM | POA: Diagnosis not present

## 2022-01-12 DIAGNOSIS — M549 Dorsalgia, unspecified: Secondary | ICD-10-CM | POA: Diagnosis not present

## 2022-01-12 DIAGNOSIS — O2341 Unspecified infection of urinary tract in pregnancy, first trimester: Secondary | ICD-10-CM | POA: Insufficient documentation

## 2022-01-12 DIAGNOSIS — O26891 Other specified pregnancy related conditions, first trimester: Secondary | ICD-10-CM | POA: Diagnosis not present

## 2022-01-12 LAB — CBC WITH DIFFERENTIAL/PLATELET
Abs Immature Granulocytes: 0.04 10*3/uL (ref 0.00–0.07)
Basophils Absolute: 0 10*3/uL (ref 0.0–0.1)
Basophils Relative: 0 %
Eosinophils Absolute: 0 10*3/uL (ref 0.0–0.5)
Eosinophils Relative: 0 %
HCT: 35.5 % — ABNORMAL LOW (ref 36.0–46.0)
Hemoglobin: 11.1 g/dL — ABNORMAL LOW (ref 12.0–15.0)
Immature Granulocytes: 1 %
Lymphocytes Relative: 11 %
Lymphs Abs: 1 10*3/uL (ref 0.7–4.0)
MCH: 24.9 pg — ABNORMAL LOW (ref 26.0–34.0)
MCHC: 31.3 g/dL (ref 30.0–36.0)
MCV: 79.6 fL — ABNORMAL LOW (ref 80.0–100.0)
Monocytes Absolute: 1.2 10*3/uL — ABNORMAL HIGH (ref 0.1–1.0)
Monocytes Relative: 14 %
Neutro Abs: 6.6 10*3/uL (ref 1.7–7.7)
Neutrophils Relative %: 74 %
Platelets: 272 10*3/uL (ref 150–400)
RBC: 4.46 MIL/uL (ref 3.87–5.11)
RDW: 17.8 % — ABNORMAL HIGH (ref 11.5–15.5)
WBC: 8.9 10*3/uL (ref 4.0–10.5)
nRBC: 0 % (ref 0.0–0.2)

## 2022-01-12 LAB — COMPREHENSIVE METABOLIC PANEL
ALT: 9 U/L (ref 0–44)
AST: 14 U/L — ABNORMAL LOW (ref 15–41)
Albumin: 3.2 g/dL — ABNORMAL LOW (ref 3.5–5.0)
Alkaline Phosphatase: 43 U/L (ref 38–126)
Anion gap: 7 (ref 5–15)
BUN: 10 mg/dL (ref 6–20)
CO2: 23 mmol/L (ref 22–32)
Calcium: 8.5 mg/dL — ABNORMAL LOW (ref 8.9–10.3)
Chloride: 104 mmol/L (ref 98–111)
Creatinine, Ser: 0.62 mg/dL (ref 0.44–1.00)
GFR, Estimated: >60 mL/min (ref >60)
Glucose, Bld: 93 mg/dL (ref 70–99)
Potassium: 3.9 mmol/L (ref 3.5–5.1)
Sodium: 134 mmol/L — ABNORMAL LOW (ref 135–145)
Total Bilirubin: 0.2 mg/dL — ABNORMAL LOW (ref 0.3–1.2)
Total Protein: 7.2 g/dL (ref 6.5–8.1)

## 2022-01-12 LAB — LIPASE, BLOOD: Lipase: 23 U/L (ref 11–51)

## 2022-01-12 LAB — WET PREP, GENITAL
Sperm: NONE SEEN
Trich, Wet Prep: NONE SEEN
WBC, Wet Prep HPF POC: <10 (ref <10)
Yeast Wet Prep HPF POC: NONE SEEN

## 2022-01-12 LAB — URINALYSIS, ROUTINE W REFLEX MICROSCOPIC
Bilirubin Urine: NEGATIVE
Glucose, UA: NEGATIVE mg/dL
Hgb urine dipstick: NEGATIVE
Ketones, ur: 5 mg/dL — AB
Nitrite: NEGATIVE
Protein, ur: NEGATIVE mg/dL
Specific Gravity, Urine: 1.012 (ref 1.005–1.030)
WBC, UA: >50 WBC/hpf — ABNORMAL HIGH (ref 0–5)
pH: 6 (ref 5.0–8.0)

## 2022-01-12 LAB — HIV ANTIBODY (ROUTINE TESTING W REFLEX): HIV Screen 4th Generation wRfx: NONREACTIVE

## 2022-01-12 LAB — HCG, QUANTITATIVE, PREGNANCY: hCG, Beta Chain, Quant, S: 114062 m[IU]/mL — ABNORMAL HIGH (ref <5)

## 2022-01-12 MED ORDER — ACETAMINOPHEN 500 MG PO TABS: 500.0000 mg | ORAL_TABLET | Freq: Four times a day (QID) | ORAL | 0 refills | Status: DC | PRN

## 2022-01-12 MED ORDER — CEPHALEXIN 500 MG PO CAPS: 500.0000 mg | ORAL_CAPSULE | Freq: Four times a day (QID) | ORAL | 0 refills | Status: DC

## 2022-01-12 MED ORDER — SODIUM CHLORIDE 0.9 % IV SOLN
1.0000 g | Freq: Once | INTRAVENOUS | Status: AC
  Administered 2022-01-12: 1 g via INTRAVENOUS
  Filled 2022-01-12: qty 10

## 2022-01-12 MED ORDER — MORPHINE SULFATE (PF) 4 MG/ML IV SOLN
4.0000 mg | Freq: Once | INTRAVENOUS | Status: AC
  Administered 2022-01-12: 4 mg via INTRAVENOUS
  Filled 2022-01-12: qty 1

## 2022-01-12 MED ORDER — ACETAMINOPHEN 325 MG PO TABS
650.0000 mg | ORAL_TABLET | Freq: Once | ORAL | Status: DC
  Filled 2022-01-12: qty 2

## 2022-01-12 NOTE — ED Triage Notes (Signed)
Per EMS- Patient c/o bilateral lower back pain x 4 days. Patient states she has not had a period in 2-3 months.      Noted that patient was seen at [facility] ED for similar complaints on 01/08/22

## 2022-01-12 NOTE — Discharge Instructions (Addendum)
You have been evaluated for your symptoms.  It appears you have a urinary tract infection.  Furthermore you are approximately 12 weeks and 6 days pregnant.  You also have enlargements of your ureter right greater than left causing pain.  This is likely due to your current pregnancy.  Please take antibiotic as prescribed.  Call and follow-up closely with [facility] next week for reassessment.  You should also follow-up with OB/GYN for further care of your pregnancy.  Return if you have any concern.

## 2022-01-12 NOTE — ED Provider Notes (Signed)
[name] DEPT Provider Note   CSN: [id number] Arrival date & time: 01/12/22  1058     History  Chief Complaint  Patient presents with   Back Pain   Possible Pregnancy    Kelly Huang is a 36 y.o. female.  The history is provided by the patient and medical records. No language interpreter was used.  Back Pain Possible Pregnancy    36 year old female G2P0 presenting with complaint of lower back pain.  Since yesterday patient endorsed having pain to the lower back, having subjective fever and states for the past week she has not been feeling well.  She did report her last menstrual period was in May of this year.  States she thinks she is pregnant.  She endorses nausea without vomiting.  She does endorse some urinary discomfort which include urinary urgency.  Pain is primarily to her right flank.  Endorses tactile fever.  Denies any history of kidney stone.  Home Medications Prior to Admission medications   Medication Sig Start Date End Date Taking? Authorizing Provider  albuterol (PROVENTIL HFA;VENTOLIN HFA) 108 (90 Base) MCG/ACT inhaler Inhale 1-2 puffs into the lungs every 6 (six) hours as needed for wheezing or shortness of breath. Patient not taking: Reported on 02/23/2016 09/23/15   Okey Regal, PA-C  clotrimazole-betamethasone (LOTRISONE) cream Apply 1 application topically 2 (two) times daily. 06/22/17   Emily Filbert, MD  cyclobenzaprine (FLEXERIL) 10 MG tablet Take 1 tablet (10 mg total) by mouth 2 (two) times daily as needed for muscle spasms. 05/03/16   Ashley Murrain, NP  diphenhydramine-acetaminophen (TYLENOL PM) 25-500 MG TABS tablet Take 2 tablets by mouth at bedtime as needed (sleep).    [provider]  hydrOXYzine (ATARAX/VISTARIL) 25 MG tablet Take 1 tablet (25 mg total) by mouth every 8 (eight) hours as needed for anxiety. 02/23/16   Domenic Moras, PA-C  naproxen (NAPROSYN) 500 MG tablet Take 1 tablet (500 mg total) by  mouth 2 (two) times daily. 05/03/16   Ashley Murrain, NP      Allergies    Bactrim [sulfamethoxazole-trimethoprim]    Review of Systems   Review of Systems  Musculoskeletal:  Positive for back pain.  All other systems reviewed and are negative.   Physical Exam Updated Vital Signs BP 106/73   Pulse 89   Temp 98.7 F (37.1 C) (Oral)   Resp 16   Ht 5' (1.524 m)   Wt 50.8 kg   LMP 10/26/2021 (Approximate)   SpO2 100%   BMI 21.87 kg/m  Physical Exam Vitals and nursing note reviewed.  Constitutional:      General: She is not in acute distress.    Appearance: She is well-developed.     Comments: Appears uncomfortable  HENT:     Head: Atraumatic.  Eyes:     Conjunctiva/sclera: Conjunctivae normal.  Cardiovascular:     Rate and Rhythm: Normal rate and regular rhythm.     Pulses: Normal pulses.     Heart sounds: Normal heart sounds.  Pulmonary:     Effort: Pulmonary effort is normal.  Abdominal:     Palpations: Abdomen is soft.     Tenderness: There is right CVA tenderness. There is no left CVA tenderness.  Musculoskeletal:     Cervical back: Neck supple.  Skin:    Findings: No rash.  Neurological:     Mental Status: She is alert.  Psychiatric:        Mood and Affect: Mood normal.  ED Results / Procedures / Treatments   Labs (all labs ordered are listed, but only abnormal results are displayed) Labs Reviewed  WET PREP, GENITAL - Abnormal; Notable for the following components:      Result Value   Clue Cells Wet Prep HPF POC PRESENT (*)    All other components within normal limits  CBC WITH DIFFERENTIAL/PLATELET - Abnormal; Notable for the following components:   Hemoglobin 11.1 (*)    HCT 35.5 (*)    MCV 79.6 (*)    MCH 24.9 (*)    RDW 17.8 (*)    Monocytes Absolute 1.2 (*)    All other components within normal limits  COMPREHENSIVE METABOLIC PANEL - Abnormal; Notable for the following components:   Sodium 134 (*)    Calcium 8.5 (*)    Albumin 3.2 (*)     AST 14 (*)    Total Bilirubin 0.2 (*)    All other components within normal limits  URINALYSIS, ROUTINE W REFLEX MICROSCOPIC - Abnormal; Notable for the following components:   APPearance CLOUDY (*)    Ketones, ur 5 (*)    Leukocytes,Ua LARGE (*)    WBC, UA >50 (*)    Bacteria, UA FEW (*)    All other components within normal limits  HCG, QUANTITATIVE, PREGNANCY - Abnormal; Notable for the following components:   hCG, Beta Chain, Quant, S 114,062 (*)    All other components within normal limits  URINE CULTURE  LIPASE, BLOOD  HIV ANTIBODY (ROUTINE TESTING W REFLEX)  RPR  GC/CHLAMYDIA PROBE AMP ([place]) NOT AT [facility]    EKG None  Radiology US OB Comp < 14 Wks  Addendum Date: 01/12/2022   ADDENDUM REPORT: 01/12/2022 14:59 ADDENDUM: Consider consultation with urology. Electronically Signed   By: Nolon Nations M.D.   On: 01/12/2022 14:59   Result Date: 01/12/2022 CLINICAL DATA:  Back pain. Quantitative beta HCG is 114,062. LMP was 10/19/2021. EXAM: OBSTETRIC <14 WK ULTRASOUND TECHNIQUE: Transabdominal ultrasound was performed for evaluation of the gestation as well as the maternal uterus and adnexal regions. COMPARISON:  None Available. FINDINGS: Intrauterine gestational sac: Single Yolk sac:  Not Visualized. Embryo:  Visualized. Cardiac Activity: Visualized. Heart Rate: 157 bpm CRL:   66.2 mm   12 w 6 d                  [place] EDC: 07/21/2022 Subchorionic hemorrhage:  None visualized. Maternal uterus/adnexae: Normal appearance of the ovaries. No free pelvic fluid. Additional: Note is made of bilateral hydronephrosis, moderate on the RIGHT and minimal on the LEFT. Urinary bladder shows presence of low level internal echoes, consistent with layering debris. IMPRESSION: 1. Single living intrauterine fetus measuring 12 weeks 6 days. By today's exam, EDC is 07/21/2022. 2. Note is made of bilateral hydronephrosis, RIGHT greater than LEFT. 3. Layering debris within the urinary bladder. These  results will be called to the ordering clinician or representative by the Radiologist Assistant, and communication documented in the PACS or [employer]. Electronically Signed: By: Nolon Nations M.D. On: 01/12/2022 14:55    Procedures Procedures    Medications Ordered in ED Medications  cefTRIAXone (ROCEPHIN) 1 g in sodium chloride 0.9 % 100 mL IVPB (1 g Intravenous New Bag/Given 01/12/22 1516)  morphine (PF) 4 MG/ML injection 4 mg (4 mg Intravenous Given 01/12/22 1521)    ED Course/ Medical Decision Making/ A&P  Medical Decision Making Amount and/or Complexity of Data Reviewed Labs: ordered. Radiology: ordered.  Risk OTC drugs. Prescription drug management.   BP 106/73   Pulse 89   Temp 98.7 F (37.1 C) (Oral)   Resp 16   Ht 5' (1.524 m)   Wt 50.8 kg   LMP 10/26/2021 (Approximate)   SpO2 100%   BMI 21.87 kg/m   3:18 PM This is a 36 year old female G2, P0 last menstruation was in May of this year presenting with right flank pain.  Furthermore she also mentioned that she think she is pregnant.  She endorsed flank pain that started last night, intense, nothing seems to make it better or worse.  She endorsed nausea without vomiting.  She endorsed some urinary urgency without burning on urination and no hematuria.  She denies any vaginal bleeding or vaginal discharge.  On exam patient appears uncomfortable.  She does have right CVA tenderness.  She does not have any significant abdominal tenderness on exam.  Vital signs remarkable for blood pressure of 106/73, no tachycardia no fever, and no hypoxia.  Labs and imaging obtained independently viewed interpreted by me and I agree with radiology interpretation.  Patient has a quantitative beta-hCG of 114,000.  Normal WBC, hemoglobin is 11.1.  Her electrolyte panels are reassuring.  Normal lipase.  Urinalysis obtained shows large leukocyte Estrace, greater than 50 WBC as well as a few bacteria.  Given  this finding, I have ordered antibiotic which includes Rocephin and have sent urine culture as this is likely to be a urinary tract infection.  Transvaginal ultrasound obtained demonstrated a single living IUP measuring 2 weeks and 6-day.  Furthermore, it was noted that patient has bilateral hydronephrosis right greater than left with layering debris within the urinary bladder.  Given this finding, I will consult urology for further recommendation.  Patient is receiving opiate pain medication and IV fluid.  Care discussed with dr. Dayna Ramus.   3:37 PM I appreciate consultation from on-call urologist Dr. Mena Goes, who felt patient can follow-up outpatient with [facility] next week for repeat ultrasound.  Suspect finding is likely related to her ongoing pregnancy.  Patient has normal renal function.  She is afebrile here, and she has normal WBC.  5:20 PM Patient endorsed increasing pain and request for additional pain management.  Pain is primary to the right flank.  Additional opiate pain medication given.  Patient also requesting for something to eat and drink.  She certainly can eat.  I have notified staff.  Anticipate discharge once symptoms more controlled.  This patient presents to the ED for concern of flank pain, this involves an extensive number of treatment options, and is a complaint that carries with it a high risk of complications and morbidity.  The differential diagnosis includes kidney stone, UTI, pyelonephritis, MSK, ectopic pregnancy, PID  Co morbidities that complicate the patient evaluation seizure Additional history obtained:  Additional history obtained from EMS External records from outside source obtained and reviewed including EMR including prior labs and imaging  Lab Tests:  I Ordered, and personally interpreted labs.  The pertinent results include:  as above  Imaging Studies ordered:  I ordered imaging studies including transvaginal US I independently visualized  and interpreted imaging which showed IUP approximately 12wks6days along with bilateral hydronephrosis I agree with the radiologist interpretation  Cardiac Monitoring:  The patient was maintained on a cardiac monitor.  I personally viewed and interpreted the cardiac monitored which showed an underlying rhythm of: NSR  Medicines  ordered and prescription drug management:  I ordered medication including rocephin  for UTI Reevaluation of the patient after these medicines showed that the patient improved I have reviewed the patients home medicines and have made adjustments as needed  Test Considered: as above  Critical Interventions: IVF  Opiate pain medication  IV abx  Consultations Obtained:  I requested consultation with the urologist Dr. Mena Goes,  and discussed lab and imaging findings as well as pertinent plan - they recommend: outpt f/u  Problem List / ED Course: pregnancy  UTI  hydronephrosis  Reevaluation:  After the interventions noted above, I reevaluated the patient and found that they have :improved  Social Determinants of Health: tobacco use  Dispostion:  After consideration of the diagnostic results and the patients response to treatment, I feel that the patent would benefit from outpt f/u with OBGYN and with Urology.         Final Clinical Impression(s) / ED Diagnoses Final diagnoses:  Hydronephrosis, unspecified hydronephrosis type  Lower urinary tract infectious disease  [redacted] weeks gestation of pregnancy    Rx / DC Orders ED Discharge Orders          Ordered    acetaminophen (TYLENOL) 500 MG tablet  Every 6 hours PRN        01/12/22 1904    cephALEXin (KEFLEX) 500 MG capsule  4 times daily        01/12/22 1904              Fayrene Helper, PA-C 01/17/22 2154    Kneller, Cecile Sheerer, DO 01/18/22 0021

## 2022-01-12 NOTE — ED Notes (Signed)
I provided reinforced discharge education based off of after visit summary/care provided. Pt acknowledged and understood my education. Pt had no further questions/concerns for provider/myself. After visit summary provided to pt. 

## 2022-01-12 NOTE — ED Provider Triage Note (Signed)
Emergency Medicine Provider Triage Evaluation Note  Kelly Huang , a 36 y.o. female  was evaluated in triage.  Pt complains of back pain. Report having subjective fever and pain to lower back since yesterday.  Haven't been feeling well for the past week.  LMP in May.  Is a G2P0 with abortion and stillborn.  Sts she is pregnant.  Denies dysuria, vaginal bleeding or vaginal discharge  Review of Systems  Positive: As above Negative: As above  Physical Exam  BP 106/73   Pulse 89   Temp 98.7 F (37.1 C) (Oral)   Resp 16   Ht 5' (1.524 m)   Wt 50.8 kg   LMP 10/26/2021 (Approximate)   SpO2 100%   BMI 21.87 kg/m  Gen:   Awake, no distress   Resp:  Normal effort  MSK:   Moves extremities without difficulty  Other:    Medical Decision Making  Medically screening exam initiated at 12:12 PM.  Appropriate orders placed.  Kelly Huang was informed that the remainder of the evaluation will be completed by another provider, this initial triage assessment does not replace that evaluation, and the importance of remaining in the ED until their evaluation is complete.     Fayrene Helper, PA-C 01/12/22 1213

## 2022-01-13 LAB — GC/CHLAMYDIA PROBE AMP (~~LOC~~) NOT AT ARMC
Chlamydia: NEGATIVE
Comment: NEGATIVE
Comment: NORMAL
Neisseria Gonorrhea: NEGATIVE

## 2022-01-13 LAB — RPR: RPR Ser Ql: NONREACTIVE

## 2022-01-14 LAB — URINE CULTURE: Culture: >=100000 — AB

## 2022-01-15 ENCOUNTER — Telehealth: Payer: Self-pay

## 2022-01-15 NOTE — Telephone Encounter (Signed)
Post ED Visit - Positive Culture Follow-up: Unsuccessful Patient Follow-up  Culture assessed and recommendations reviewed by:  [x]  , Pharm.D. []  Bernadene Person, Pharm.D., BCPS AQ-ID []  , Pharm.D., BCPS []  Celedonio Miyamoto, Pharm.D., BCPS []  [place], Garvin Fila.D., BCPS, AAHIVP []  , Pharm.D., BCPS, AAHIVP []  Georgina Pillion, PharmD []  , PharmD, BCPS  Positive urine culture  []  Patient discharged without antimicrobial prescription and treatment is now indicated [x]  Organism is resistant to prescribed ED discharge antimicrobial []  Patient with positive blood cultures   Plan s/s check, if still having flank pain, Dysuria, new fevers. Start Cefadroxil 1 gm BID x 3 days (to start after completing Keflex) per ED provider [place], PA-C  Unable to contact patient after 3 attempts, letter will be sent to address on file  [address] 01/15/2022, 1:19 PM

## 2022-01-15 NOTE — Progress Notes (Signed)
ED Antimicrobial Stewardship Positive Culture Follow Up   Kelly Huang is an 36 y.o. female who presented to [facility] on 01/12/2022 with a chief complaint of  Chief Complaint  Patient presents with   Back Pain   Possible Pregnancy    Recent Results (from the past 720 hour(s))  Urine Culture     Status: Abnormal   Collection Time: 01/12/22 12:12 PM   Specimen: Urine, Clean Catch  Result Value Ref Range Status   Specimen Description   Final    URINE, CLEAN CATCH Performed at [facility], [address], [place], [place] [address]    Special Requests   Final    NONE Performed at [facility], [address], [place], [place] [address]    Culture >=100,000 COLONIES/mL ESCHERICHIA COLI (A)  Final   Report Status 01/14/2022 FINAL  Final   Organism ID, Bacteria ESCHERICHIA COLI (A)  Final      Susceptibility   Escherichia coli - MIC*    AMPICILLIN <=2 SENSITIVE Sensitive     CEFAZOLIN <=4 SENSITIVE Sensitive     CEFEPIME <=0.12 SENSITIVE Sensitive     CEFTRIAXONE <=0.25 SENSITIVE Sensitive     CIPROFLOXACIN <=0.25 SENSITIVE Sensitive     GENTAMICIN <=1 SENSITIVE Sensitive     IMIPENEM <=0.25 SENSITIVE Sensitive     NITROFURANTOIN <=16 SENSITIVE Sensitive     TRIMETH/SULFA <=20 SENSITIVE Sensitive     AMPICILLIN/SULBACTAM <=2 SENSITIVE Sensitive     PIP/TAZO <=4 SENSITIVE Sensitive     * >=100,000 COLONIES/mL ESCHERICHIA COLI  Wet prep, genital     Status: Abnormal   Collection Time: 01/12/22  2:52 PM   Specimen: PATH Cytology Cervicovaginal Ancillary Only  Result Value Ref Range Status   Yeast Wet Prep HPF POC NONE SEEN NONE SEEN Final   Trich, Wet Prep NONE SEEN NONE SEEN Final   Clue Cells Wet Prep HPF POC PRESENT (A) NONE SEEN Final   WBC, Wet Prep HPF POC <10 <10 Final   Sperm NONE SEEN  Final    Comment: Performed at [facility], [address] [place], [place] [address]   Treated with Keflex x 5 days for  pyelonephritis - dose appropriate but duration less than ideal for upper UTI. Pt is supposed to f/u with Urology for bilateral hydronephrosis, debris in bladder  Plan: Call patient for symptom check: If still with flank pain, dysuria, or having new fevers, order cefadroxil 1 gm BID x 3 days to be started AFTER completing current Keflex Rx Regardless of symptoms, needs to follow-up with Urology   ED Provider: Honor Loh, PA-C   Mariluz Crespo A 01/15/2022, 10:17 AM Clinical Pharmacist [phone]

## 2022-02-18 ENCOUNTER — Inpatient Hospital Stay (HOSPITAL_COMMUNITY): Payer: Medicaid Other

## 2022-02-18 ENCOUNTER — Other Ambulatory Visit: Payer: Self-pay

## 2022-02-18 ENCOUNTER — Encounter: Payer: Self-pay | Admitting: Student

## 2022-02-18 ENCOUNTER — Inpatient Hospital Stay (HOSPITAL_COMMUNITY)
Admission: AD | Admit: 2022-02-18 | Discharge: 2022-02-22 | DRG: 832 | Disposition: A | Discharge status: Home / Self Care | Payer: Medicaid Other | Attending: Obstetrics and Gynecology | Admitting: Obstetrics and Gynecology

## 2022-02-18 DIAGNOSIS — Z8759 Personal history of other complications of pregnancy, childbirth and the puerperium: Secondary | ICD-10-CM

## 2022-02-18 DIAGNOSIS — O99612 Diseases of the digestive system complicating pregnancy, second trimester: Secondary | ICD-10-CM | POA: Diagnosis present

## 2022-02-18 DIAGNOSIS — F1490 Cocaine use, unspecified, uncomplicated: Secondary | ICD-10-CM

## 2022-02-18 DIAGNOSIS — O0932 Supervision of pregnancy with insufficient antenatal care, second trimester: Secondary | ICD-10-CM

## 2022-02-18 DIAGNOSIS — Z3A16 16 weeks gestation of pregnancy: Secondary | ICD-10-CM | POA: Diagnosis not present

## 2022-02-18 DIAGNOSIS — Z881 Allergy status to other antibiotic agents status: Secondary | ICD-10-CM

## 2022-02-18 DIAGNOSIS — F1410 Cocaine abuse, uncomplicated: Secondary | ICD-10-CM | POA: Diagnosis present

## 2022-02-18 DIAGNOSIS — O99332 Smoking (tobacco) complicating pregnancy, second trimester: Secondary | ICD-10-CM | POA: Diagnosis present

## 2022-02-18 DIAGNOSIS — O09522 Supervision of elderly multigravida, second trimester: Secondary | ICD-10-CM | POA: Diagnosis present

## 2022-02-18 DIAGNOSIS — O99312 Alcohol use complicating pregnancy, second trimester: Secondary | ICD-10-CM | POA: Diagnosis present

## 2022-02-18 DIAGNOSIS — O2302 Infections of kidney in pregnancy, second trimester: Principal | ICD-10-CM | POA: Diagnosis present

## 2022-02-18 DIAGNOSIS — K5900 Constipation, unspecified: Secondary | ICD-10-CM | POA: Diagnosis present

## 2022-02-18 DIAGNOSIS — O2341 Unspecified infection of urinary tract in pregnancy, first trimester: Secondary | ICD-10-CM | POA: Diagnosis present

## 2022-02-18 DIAGNOSIS — O99012 Anemia complicating pregnancy, second trimester: Secondary | ICD-10-CM | POA: Diagnosis present

## 2022-02-18 DIAGNOSIS — N390 Urinary tract infection, site not specified: Secondary | ICD-10-CM | POA: Diagnosis present

## 2022-02-18 DIAGNOSIS — O99320 Drug use complicating pregnancy, unspecified trimester: Secondary | ICD-10-CM | POA: Diagnosis present

## 2022-02-18 DIAGNOSIS — F17210 Nicotine dependence, cigarettes, uncomplicated: Secondary | ICD-10-CM | POA: Diagnosis present

## 2022-02-18 DIAGNOSIS — O99322 Drug use complicating pregnancy, second trimester: Secondary | ICD-10-CM | POA: Diagnosis present

## 2022-02-18 DIAGNOSIS — O2342 Unspecified infection of urinary tract in pregnancy, second trimester: Secondary | ICD-10-CM | POA: Diagnosis present

## 2022-02-18 DIAGNOSIS — O99310 Alcohol use complicating pregnancy, unspecified trimester: Secondary | ICD-10-CM

## 2022-02-18 DIAGNOSIS — Z87898 Personal history of other specified conditions: Secondary | ICD-10-CM

## 2022-02-18 DIAGNOSIS — O26892 Other specified pregnancy related conditions, second trimester: Secondary | ICD-10-CM | POA: Diagnosis present

## 2022-02-18 DIAGNOSIS — F1990 Other psychoactive substance use, unspecified, uncomplicated: Secondary | ICD-10-CM | POA: Diagnosis present

## 2022-02-18 HISTORY — DX: Cocaine use, unspecified, uncomplicated: F14.90

## 2022-02-18 HISTORY — DX: Alcohol use complicating pregnancy, unspecified trimester: O99.310

## 2022-02-18 HISTORY — DX: Drug use complicating pregnancy, unspecified trimester: O99.320

## 2022-02-18 LAB — URINALYSIS, ROUTINE W REFLEX MICROSCOPIC
Bacteria, UA: NONE SEEN
Bilirubin Urine: NEGATIVE
Glucose, UA: NEGATIVE mg/dL
Ketones, ur: NEGATIVE mg/dL
Nitrite: NEGATIVE
Protein, ur: 100 mg/dL — AB
RBC / HPF: >50 RBC/hpf — ABNORMAL HIGH (ref 0–5)
Specific Gravity, Urine: 1.017 (ref 1.005–1.030)
WBC, UA: >50 WBC/hpf — ABNORMAL HIGH (ref 0–5)
pH: 6 (ref 5.0–8.0)

## 2022-02-18 LAB — CBC WITH DIFFERENTIAL/PLATELET
Abs Immature Granulocytes: 0.07 10*3/uL (ref 0.00–0.07)
Basophils Absolute: 0 10*3/uL (ref 0.0–0.1)
Basophils Relative: 0 %
Eosinophils Absolute: 0 10*3/uL (ref 0.0–0.5)
Eosinophils Relative: 0 %
HCT: 35.6 % — ABNORMAL LOW (ref 36.0–46.0)
Hemoglobin: 11.5 g/dL — ABNORMAL LOW (ref 12.0–15.0)
Immature Granulocytes: 1 %
Lymphocytes Relative: 13 %
Lymphs Abs: 1.3 10*3/uL (ref 0.7–4.0)
MCH: 25.4 pg — ABNORMAL LOW (ref 26.0–34.0)
MCHC: 32.3 g/dL (ref 30.0–36.0)
MCV: 78.8 fL — ABNORMAL LOW (ref 80.0–100.0)
Monocytes Absolute: 0.8 10*3/uL (ref 0.1–1.0)
Monocytes Relative: 8 %
Neutro Abs: 7.9 10*3/uL — ABNORMAL HIGH (ref 1.7–7.7)
Neutrophils Relative %: 78 %
Platelets: 335 10*3/uL (ref 150–400)
RBC: 4.52 MIL/uL (ref 3.87–5.11)
RDW: 16.8 % — ABNORMAL HIGH (ref 11.5–15.5)
WBC: 10.1 10*3/uL (ref 4.0–10.5)
nRBC: 0 % (ref 0.0–0.2)

## 2022-02-18 LAB — COMPREHENSIVE METABOLIC PANEL
ALT: 12 U/L (ref 0–44)
AST: 19 U/L (ref 15–41)
Albumin: 3.4 g/dL — ABNORMAL LOW (ref 3.5–5.0)
Alkaline Phosphatase: 59 U/L (ref 38–126)
Anion gap: 4 — ABNORMAL LOW (ref 5–15)
BUN: 7 mg/dL (ref 6–20)
CO2: 22 mmol/L (ref 22–32)
Calcium: 8.2 mg/dL — ABNORMAL LOW (ref 8.9–10.3)
Chloride: 108 mmol/L (ref 98–111)
Creatinine, Ser: 0.6 mg/dL (ref 0.44–1.00)
GFR, Estimated: >60 mL/min (ref >60)
Glucose, Bld: 95 mg/dL (ref 70–99)
Potassium: 4.2 mmol/L (ref 3.5–5.1)
Sodium: 134 mmol/L — ABNORMAL LOW (ref 135–145)
Total Bilirubin: 0.2 mg/dL — ABNORMAL LOW (ref 0.3–1.2)
Total Protein: 7.5 g/dL (ref 6.5–8.1)

## 2022-02-18 LAB — LIPASE, BLOOD: Lipase: 23 U/L (ref 11–51)

## 2022-02-18 LAB — WET PREP, GENITAL
Sperm: NONE SEEN
Trich, Wet Prep: NONE SEEN
WBC, Wet Prep HPF POC: <10 (ref <10)
Yeast Wet Prep HPF POC: NONE SEEN

## 2022-02-18 LAB — RAPID URINE DRUG SCREEN, HOSP PERFORMED
Amphetamines: NOT DETECTED
Barbiturates: NOT DETECTED
Benzodiazepines: NOT DETECTED
Cocaine: POSITIVE — AB
Opiates: NOT DETECTED
Tetrahydrocannabinol: NOT DETECTED

## 2022-02-18 LAB — HEPATITIS B SURFACE ANTIGEN: Hepatitis B Surface Ag: NONREACTIVE

## 2022-02-18 LAB — HIV ANTIBODY (ROUTINE TESTING W REFLEX): HIV Screen 4th Generation wRfx: NONREACTIVE

## 2022-02-18 MED ORDER — CALCIUM CARBONATE ANTACID 500 MG PO CHEW: 2.0000 | CHEWABLE_TABLET | ORAL | Status: DC | PRN

## 2022-02-18 MED ORDER — OXYCODONE HCL 5 MG PO TABS
5.0000 mg | ORAL_TABLET | Freq: Four times a day (QID) | ORAL | Status: DC | PRN
  Administered 2022-02-18 – 2022-02-20 (×7): 10 mg via ORAL
  Administered 2022-02-21: 5 mg via ORAL
  Administered 2022-02-21: 10 mg via ORAL
  Administered 2022-02-21: 5 mg via ORAL
  Filled 2022-02-18 (×2): qty 2
  Filled 2022-02-18: qty 1
  Filled 2022-02-18 (×2): qty 2
  Filled 2022-02-18: qty 1
  Filled 2022-02-18 (×4): qty 2

## 2022-02-18 MED ORDER — LACTATED RINGERS IV BOLUS
1000.0000 mL | Freq: Once | INTRAVENOUS | Status: AC
  Administered 2022-02-18: 1000 mL via INTRAVENOUS

## 2022-02-18 MED ORDER — OXYCODONE-ACETAMINOPHEN 5-325 MG PO TABS
2.0000 | ORAL_TABLET | Freq: Once | ORAL | Status: AC
  Administered 2022-02-18: 2 via ORAL
  Filled 2022-02-18: qty 2

## 2022-02-18 MED ORDER — ENOXAPARIN SODIUM 40 MG/0.4ML IJ SOSY
40.0000 mg | PREFILLED_SYRINGE | INTRAMUSCULAR | Status: DC
  Administered 2022-02-18 – 2022-02-20 (×3): 40 mg via SUBCUTANEOUS
  Filled 2022-02-18 (×5): qty 0.4

## 2022-02-18 MED ORDER — DOCUSATE SODIUM 100 MG PO CAPS
100.0000 mg | ORAL_CAPSULE | Freq: Two times a day (BID) | ORAL | Status: DC | PRN
  Administered 2022-02-20: 100 mg via ORAL
  Filled 2022-02-18: qty 1

## 2022-02-18 MED ORDER — PRENATAL MULTIVITAMIN CH
1.0000 | ORAL_TABLET | Freq: Every day | ORAL | Status: DC
  Administered 2022-02-19 – 2022-02-21 (×3): 1 via ORAL
  Filled 2022-02-18 (×3): qty 1

## 2022-02-18 MED ORDER — SODIUM CHLORIDE 0.9 % IV SOLN: INTRAVENOUS | Status: DC

## 2022-02-18 MED ORDER — SODIUM CHLORIDE 0.9 % IV SOLN
2.0000 g | INTRAVENOUS | Status: DC
  Administered 2022-02-18 – 2022-02-21 (×4): 2 g via INTRAVENOUS
  Filled 2022-02-18 (×5): qty 20

## 2022-02-18 MED ORDER — HYDROMORPHONE HCL 1 MG/ML IJ SOLN
1.0000 mg | Freq: Once | INTRAMUSCULAR | Status: AC
  Administered 2022-02-18: 1 mg via INTRAVENOUS
  Filled 2022-02-18: qty 1

## 2022-02-18 MED ORDER — ACETAMINOPHEN 325 MG PO TABS
650.0000 mg | ORAL_TABLET | ORAL | Status: DC | PRN
  Administered 2022-02-19 – 2022-02-20 (×4): 650 mg via ORAL
  Filled 2022-02-18 (×4): qty 2

## 2022-02-18 NOTE — H&P (Signed)
Obstetrics Admission History & Physical  02/18/2022 -  Primary OBGYN: None  Chief Complaint: right pyelo  History of Present Illness  36 y.o. G2P0100 @ [redacted]w[redacted]d, with the above CC. Pregnancy complicated by: h/o UTI this pregnancy, no PNC, AMA, +cocaine on admit UDS  Patient evaluated in MAU and negative PTL assessment and s/s c/w right pyelo; renal u/s negative. Patient seen in [facility] ED a month ago with same s/s and given dose of rocephin and sent home on abx but didn't take. Ucx at that time came back pan sensitive e.coli. ob u/s done then showed 12wks SLIUP and EDC 2-16  Review of Systems: as noted in the History of Present Illness.  Patient Active Problem List   Diagnosis Date Noted   Anemia in pregnancy, second trimester 02/19/2022   Pyelonephritis affecting pregnancy in second trimester 02/18/2022   Cocaine abuse affecting pregnancy in second trimester (HCC) 02/18/2022   No prenatal care in current pregnancy in second trimester 02/18/2022   UTI in pregnancy, antepartum, first trimester 02/18/2022   AMA (advanced maternal age) multigravida 35+, second trimester 02/18/2022   Cocaine use complicating pregnancy 02/18/2022   Alcohol use complicating pregnancy 02/18/2022    PMHx:  Past Medical History:  Diagnosis Date   Asthma    Herniated lumbar intervertebral disc    Seizures (HCC)    PSHx: History reviewed. No pertinent surgical history. Medications:  Medications Prior to Admission  Medication Sig Dispense Refill Last Dose   acetaminophen (TYLENOL) 500 MG tablet Take 1 tablet (500 mg total) by mouth every 6 (six) hours as needed. 30 tablet 0    albuterol (PROVENTIL HFA;VENTOLIN HFA) 108 (90 Base) MCG/ACT inhaler Inhale 1-2 puffs into the lungs every 6 (six) hours as needed for wheezing or shortness of breath. (Patient not taking: Reported on 02/23/2016) 1 Inhaler 0    cephALEXin (KEFLEX) 500 MG capsule Take 1 capsule (500 mg total) by mouth 4 (four) times daily. 20 capsule 0     clotrimazole-betamethasone (LOTRISONE) cream Apply 1 application topically 2 (two) times daily. 30 g 0    cyclobenzaprine (FLEXERIL) 10 MG tablet Take 1 tablet (10 mg total) by mouth 2 (two) times daily as needed for muscle spasms. 20 tablet 0    diphenhydramine-acetaminophen (TYLENOL PM) 25-500 MG TABS tablet Take 2 tablets by mouth at bedtime as needed (sleep).      hydrOXYzine (ATARAX/VISTARIL) 25 MG tablet Take 1 tablet (25 mg total) by mouth every 8 (eight) hours as needed for anxiety. 12 tablet 0    naproxen (NAPROSYN) 500 MG tablet Take 1 tablet (500 mg total) by mouth 2 (two) times daily. 20 tablet 0      Allergies: is allergic to bactrim [sulfamethoxazole-trimethoprim]. OBHx:  OB History  Gravida Para Term Preterm AB Living  2 1   1       SAB IAB Ectopic Multiple Live Births               # Outcome Date GA Lbr Len/2nd Weight Sex Delivery Anes PTL Lv  2 Current           1 Preterm 2013     Vag-Spont   FD    Obstetric Comments  IUFD 7 months    FHx: History reviewed. No pertinent family history. Soc Hx:  Social History   Socioeconomic History   Marital status: Single    Spouse name: Not on file   Number of children: Not on file   Years of education: Not on  file   Highest education level: Not on file  Occupational History   Not on file  Tobacco Use   Smoking status: Every Day    Packs/day: 0.25    Types: Cigarettes   Smokeless tobacco: Never  Vaping Use   Vaping Use: Never used  Substance and Sexual Activity   Alcohol use: Yes    Alcohol/week: 1.0 standard drink of alcohol    Types: 1 Shots of liquor per week   Drug use: Yes    Types: Cocaine    Comment: last used 02/17/22   Sexual activity: Yes    Birth control/protection: None, Condom  Other Topics Concern   Not on file  Social History Narrative   Not on file   Social Determinants of Health   Financial Resource Strain: Not on file  Food Insecurity: Food Insecurity Present (02/18/2022)   Hunger Vital  Sign    Worried About Running Out of Food in the Last Year: Never true    Ran Out of Food in the Last Year: Sometimes true  Transportation Needs: Unmet Transportation Needs (02/18/2022)   PRAPARE - Administrator, Civil Service (Medical): Yes    Lack of Transportation (Non-Medical): No  Physical Activity: Not on file  Stress: Not on file  Social Connections: Not on file  Intimate Partner Violence: Not At Risk (02/18/2022)   Humiliation, Afraid, Rape, and Kick questionnaire    Fear of Current or Ex-Partner: No    Emotionally Abused: No    Physically Abused: No    Sexually Abused: No    Objective    Current Vital Signs 24h Vital Sign Ranges  T 98.7 F (37.1 C) Temp  Avg: 98.2 F (36.8 C)  Min: 97.8 F (36.6 C)  Max: 98.7 F (37.1 C)  BP (!) 115/58 BP  Min: 109/67  Max: 115/58  HR 77 Pulse  Avg: 75.3  Min: 68  Max: 83  RR 18 Resp  Avg: 18.3  Min: 17  Max: 20  SaO2 99 % Room Air SpO2  Avg: 99.3 %  Min: 99 %  Max: 100 %       24 Hour I/O Current Shift I/O  Time Ins Outs 09/16 0701 - 09/17 0700 In: 848.9 [I.V.:748.9] Out: 2900 [Urine:2900] No intake/output data recorded.   General: Well nourished, well developed female in no acute distress.  Skin:  Warm and dry.  Back: right CVAT Cardiovascular: S1, S2 normal, no murmur, rub or gallop, regular rate and rhythm Respiratory:  Clear to auscultation bilateral. Normal respiratory effort Abdomen: nttp Neuro/Psych:  Normal mood and affect.   Labs  New OB labs pending Recent Labs  Lab 02/18/22 1053  WBC 10.1  HGB 11.5*  HCT 35.6*  PLT 335    Recent Labs  Lab 02/18/22 1053  NA 134*  K 4.2  CL 108  CO2 22  BUN 7  CREATININE 0.60  CALCIUM 8.2*  PROT 7.5  BILITOT 0.2*  ALKPHOS 59  ALT 12  AST 19  GLUCOSE 95   Radiology Narrative & Impression  CLINICAL DATA:  Pregnant, back pain, hematuria, known bilateral hydronephrosis   EXAM: RENAL / URINARY TRACT ULTRASOUND COMPLETE   COMPARISON:   01/12/2022   FINDINGS: Right Kidney:   Renal measurements: 13.2 x 5.1 x 5.0 cm = volume: 176 mL. Normal renal cortical echotexture. Moderate right-sided hydronephrosis without significant change since prior study. Echogenic debris seen within the right renal collecting system.   Left Kidney:  Renal measurements: 10.8 x 4.7 x 4.6 cm = volume: 121 mL. Normal renal cortical echotexture. There is mild left-sided hydronephrosis, without significant change since prior exam. No nephrolithiasis or renal mass.   Bladder:   Bladder is decompressed, limiting its evaluation.   Other:   Right-sided hydroureter is seen to the level of the bladder. The left ureter is not well visualized. Gravid uterus incidentally noted.   IMPRESSION: 1. Stable bilateral hydronephrosis, right greater than left. Continued echogenic debris within the right renal collecting system. 2. Right-sided hydroureter to the level of bladder. Left ureter is not well visualized.     Electronically Signed   By: Sharlet Salina M.D.   On: 02/18/2022 15:15    Assessment & Plan   36 y.o. G2P0100 @ [redacted]w[redacted]d with right pyelo; pt stable *Pregnancy: qday fetal dopplers. F/u OB labs *Renal: rocephin. Consider transition to PO meds on 9/18 *PPx: OOB ad lib, lovenox *Analgesia: po prns *Social: SW consulted *Anemia: pt amenable to venofer *Dispo possibly tomorrow; pt already has 9/27 new ob visit at the [facility]. MD Attending Center for [facility])

## 2022-02-18 NOTE — MAU Note (Signed)
Kelly Huang is a 36 y.o. at [redacted]w[redacted]d here in MAU reporting: pain started 4 hours ago, was constant but now is intermittent. No bleeding. States she saw some cloudy white fluid. Pt arrived via EMS.  Onset of complaint: today  Pain score: 9/10  Vitals:   02/18/22 1038  BP: 109/67  Pulse: 68  Resp: 20  Temp: 98.3 F (36.8 C)     FHT:137  Lab orders placed from triage: none

## 2022-02-18 NOTE — MAU Provider Note (Cosign Needed Addendum)
Chief Complaint: Abdominal Pain   Event Date/Time   First Provider Initiated Contact with Patient 02/18/22 1044      SUBJECTIVE HPI: Shanetria Luma is a 36 y.o. G2P0100 at [redacted]w[redacted]d by unsure LMP who presents to maternity admissions via EMS with severe abdominal and back pain that started 4 hours before arriving in MAU. She reports some cloudy white fluid that did not require a pad.  There are not other symptoms.  She has not yet started prenatal care in this pregnancy. Her obstetric history includes a delivery at "7 months" that was a stillbirth.    HPI  Past Medical History:  Diagnosis Date   Asthma    Herniated lumbar intervertebral disc    Seizures ([place])    History reviewed. No pertinent surgical history. Social History   Socioeconomic History   Marital status: Single    Spouse name: Not on file   Number of children: Not on file   Years of education: Not on file   Highest education level: Not on file  Occupational History   Not on file  Tobacco Use   Smoking status: Every Day    Packs/day: 0.25    Types: Cigarettes   Smokeless tobacco: Never  Vaping Use   Vaping Use: Never used  Substance and Sexual Activity   Alcohol use: Yes    Alcohol/week: 1.0 standard drink of alcohol    Types: 1 Shots of liquor per week   Drug use: Yes    Types: Cocaine    Comment: last used 02/17/22   Sexual activity: Yes    Birth control/protection: None, Condom  Other Topics Concern   Not on file  Social History Narrative   Not on file   Social Determinants of Health   Financial Resource Strain: Not on file  Food Insecurity: Food Insecurity Present (02/18/2022)   Hunger Vital Sign    Worried About Running Out of Food in the Last Year: Never true    Ran Out of Food in the Last Year: Sometimes true  Transportation Needs: Unmet Transportation Needs (02/18/2022)   PRAPARE - Hydrologist (Medical): Yes    Lack of Transportation (Non-Medical): No  Physical  Activity: Not on file  Stress: Not on file  Social Connections: Not on file  Intimate Partner Violence: Not At Risk (02/18/2022)   Humiliation, Afraid, Rape, and Kick questionnaire    Fear of Current or Ex-Partner: No    Emotionally Abused: No    Physically Abused: No    Sexually Abused: No   No current facility-administered medications on file prior to encounter.   Current Outpatient Medications on File Prior to Encounter  Medication Sig Dispense Refill   acetaminophen (TYLENOL) 500 MG tablet Take 1 tablet (500 mg total) by mouth every 6 (six) hours as needed. 30 tablet 0   albuterol (PROVENTIL HFA;VENTOLIN HFA) 108 (90 Base) MCG/ACT inhaler Inhale 1-2 puffs into the lungs every 6 (six) hours as needed for wheezing or shortness of breath. (Patient not taking: Reported on 02/23/2016) 1 Inhaler 0   cephALEXin (KEFLEX) 500 MG capsule Take 1 capsule (500 mg total) by mouth 4 (four) times daily. 20 capsule 0   clotrimazole-betamethasone (LOTRISONE) cream Apply 1 application topically 2 (two) times daily. 30 g 0   cyclobenzaprine (FLEXERIL) 10 MG tablet Take 1 tablet (10 mg total) by mouth 2 (two) times daily as needed for muscle spasms. 20 tablet 0   diphenhydramine-acetaminophen (TYLENOL PM) 25-500 MG TABS tablet  Take 2 tablets by mouth at bedtime as needed (sleep).     hydrOXYzine (ATARAX/VISTARIL) 25 MG tablet Take 1 tablet (25 mg total) by mouth every 8 (eight) hours as needed for anxiety. 12 tablet 0   naproxen (NAPROSYN) 500 MG tablet Take 1 tablet (500 mg total) by mouth 2 (two) times daily. 20 tablet 0   Allergies  Allergen Reactions   Bactrim [Sulfamethoxazole-Trimethoprim] Other (See Comments)    Patient is not sure of reaction. She stated that she was having dizzy spells.    ROS:  Review of Systems  Constitutional:  Negative for chills, fatigue and fever.  Eyes:  Negative for visual disturbance.  Respiratory:  Negative for shortness of breath.   Cardiovascular:  Negative for  chest pain.  Gastrointestinal:  Positive for abdominal pain. Negative for nausea and vomiting.  Genitourinary:  Negative for difficulty urinating, dysuria, flank pain, pelvic pain, vaginal bleeding, vaginal discharge and vaginal pain.  Musculoskeletal:  Positive for back pain.  Neurological:  Negative for dizziness and headaches.  Psychiatric/Behavioral: Negative.       I have reviewed patient's Past Medical Hx, Surgical Hx, Family Hx, Social Hx, medications and allergies.   Physical Exam  Patient Vitals for the past 24 hrs:  BP Temp Temp src Pulse Resp SpO2 Height Weight  02/18/22 1702 -- -- -- -- -- -- 5' (1.524 m) 52.1 kg  02/18/22 1613 111/72 98 F (36.7 C) Oral 73 18 100 % -- --  02/18/22 1038 109/67 98.3 F (36.8 C) Oral 68 20 -- -- --   Constitutional: Well-developed, well-nourished female in no acute distress.  Cardiovascular: normal rate Respiratory: normal effort GI: Abd soft, non-tender. Pos BS x 4 MS: Extremities nontender, no edema, normal ROM Neurologic: Alert and oriented x 4.  GU: positive bilateral CVA tenderness  SSE: Cervix pink, visually closed, without lesion, no pooling of fluid, scant white creamy discharge, vaginal walls and external genitalia normal  PELVIC EXAM:  Dilation: Closed Effacement (%): Thick Cervical Position: Posterior Exam by:: Fatima Blank, CNM  FHT 137 by doppler  LAB RESULTS Results for orders placed or performed during the hospital encounter of 02/18/22 (from the past 24 hour(s))  Urinalysis, Routine w reflex microscopic Urine, Clean Catch     Status: Abnormal   Collection Time: 02/18/22 10:44 AM  Result Value Ref Range   Color, Urine AMBER (A) YELLOW   APPearance TURBID (A) CLEAR   Specific Gravity, Urine 1.017 1.005 - 1.030   pH 6.0 5.0 - 8.0   Glucose, UA NEGATIVE NEGATIVE mg/dL   Hgb urine dipstick SMALL (A) NEGATIVE   Bilirubin Urine NEGATIVE NEGATIVE   Ketones, ur NEGATIVE NEGATIVE mg/dL   Protein, ur 100 (A)  NEGATIVE mg/dL   Nitrite NEGATIVE NEGATIVE   Leukocytes,Ua LARGE (A) NEGATIVE   RBC / HPF >50 (H) 0 - 5 RBC/hpf   WBC, UA >50 (H) 0 - 5 WBC/hpf   Bacteria, UA NONE SEEN NONE SEEN   WBC Clumps PRESENT    Mucus PRESENT   Rapid urine drug screen (hospital performed)     Status: Abnormal   Collection Time: 02/18/22 10:44 AM  Result Value Ref Range   Opiates NONE DETECTED NONE DETECTED   Cocaine POSITIVE (A) NONE DETECTED   Benzodiazepines NONE DETECTED NONE DETECTED   Amphetamines NONE DETECTED NONE DETECTED   Tetrahydrocannabinol NONE DETECTED NONE DETECTED   Barbiturates NONE DETECTED NONE DETECTED  Wet prep, genital     Status: Abnormal   Collection Time:  02/18/22 10:44 AM  Result Value Ref Range   Yeast Wet Prep HPF POC NONE SEEN NONE SEEN   Trich, Wet Prep NONE SEEN NONE SEEN   Clue Cells Wet Prep HPF POC PRESENT (A) NONE SEEN   WBC, Wet Prep HPF POC <10 <10   Sperm NONE SEEN   CBC with Differential/Platelet     Status: Abnormal   Collection Time: 02/18/22 10:53 AM  Result Value Ref Range   WBC 10.1 4.0 - 10.5 K/uL   RBC 4.52 3.87 - 5.11 MIL/uL   Hemoglobin 11.5 (L) 12.0 - 15.0 g/dL   HCT 35.6 (L) 36.0 - 46.0 %   MCV 78.8 (L) 80.0 - 100.0 fL   MCH 25.4 (L) 26.0 - 34.0 pg   MCHC 32.3 30.0 - 36.0 g/dL   RDW 16.8 (H) 11.5 - 15.5 %   Platelets 335 150 - 400 K/uL   nRBC 0.0 0.0 - 0.2 %   Neutrophils Relative % 78 %   Neutro Abs 7.9 (H) 1.7 - 7.7 K/uL   Lymphocytes Relative 13 %   Lymphs Abs 1.3 0.7 - 4.0 K/uL   Monocytes Relative 8 %   Monocytes Absolute 0.8 0.1 - 1.0 K/uL   Eosinophils Relative 0 %   Eosinophils Absolute 0.0 0.0 - 0.5 K/uL   Basophils Relative 0 %   Basophils Absolute 0.0 0.0 - 0.1 K/uL   Immature Granulocytes 1 %   Abs Immature Granulocytes 0.07 0.00 - 0.07 K/uL  Comprehensive metabolic panel     Status: Abnormal   Collection Time: 02/18/22 10:53 AM  Result Value Ref Range   Sodium 134 (L) 135 - 145 mmol/L   Potassium 4.2 3.5 - 5.1 mmol/L    Chloride 108 98 - 111 mmol/L   CO2 22 22 - 32 mmol/L   Glucose, Bld 95 70 - 99 mg/dL   BUN 7 6 - 20 mg/dL   Creatinine, Ser 0.60 0.44 - 1.00 mg/dL   Calcium 8.2 (L) 8.9 - 10.3 mg/dL   Total Protein 7.5 6.5 - 8.1 g/dL   Albumin 3.4 (L) 3.5 - 5.0 g/dL   AST 19 15 - 41 U/L   ALT 12 0 - 44 U/L   Alkaline Phosphatase 59 38 - 126 U/L   Total Bilirubin 0.2 (L) 0.3 - 1.2 mg/dL   GFR, Estimated >60 >60 mL/min   Anion gap 4 (L) 5 - 15  Lipase, blood     Status: None   Collection Time: 02/18/22 10:53 AM  Result Value Ref Range   Lipase 23 11 - 51 U/L  Hepatitis B surface antigen     Status: None   Collection Time: 02/18/22 10:53 AM  Result Value Ref Range   Hepatitis B Surface Ag NON REACTIVE NON REACTIVE  HIV Antibody (routine testing w rflx)     Status: None   Collection Time: 02/18/22 10:53 AM  Result Value Ref Range   HIV Screen 4th Generation wRfx Non Reactive Non Reactive  Type and screen     Status: None   Collection Time: 02/18/22 10:53 AM  Result Value Ref Range   ABO/RH(D) A POS    Antibody Screen NEG    Sample Expiration      02/21/2022,2359 Performed at [facility], [address] [place], [place] [address]     --/--/A POS (09/16 1053)  IMAGING US Renal  Result Date: 02/18/2022 CLINICAL DATA:  Pregnant, back pain, hematuria, known bilateral hydronephrosis EXAM: RENAL / URINARY  TRACT ULTRASOUND COMPLETE COMPARISON:  01/12/2022 FINDINGS: Right Kidney: Renal measurements: 13.2 x 5.1 x 5.0 cm = volume: 176 mL. Normal renal cortical echotexture. Moderate right-sided hydronephrosis without significant change since prior study. Echogenic debris seen within the right renal collecting system. Left Kidney: Renal measurements: 10.8 x 4.7 x 4.6 cm = volume: 121 mL. Normal renal cortical echotexture. There is mild left-sided hydronephrosis, without significant change since prior exam. No nephrolithiasis or renal mass. Bladder: Bladder is decompressed, limiting its evaluation.  Other: Right-sided hydroureter is seen to the level of the bladder. The left ureter is not well visualized. Gravid uterus incidentally noted. IMPRESSION: 1. Stable bilateral hydronephrosis, right greater than left. Continued echogenic debris within the right renal collecting system. 2. Right-sided hydroureter to the level of bladder. Left ureter is not well visualized. Electronically Signed   By: Randa Ngo M.D.   On: 02/18/2022 15:15    MAU Management/MDM: Orders Placed This Encounter  Procedures   Wet prep, genital   Culture, OB Urine   US Renal   Urinalysis, Routine w reflex microscopic Urine, Clean Catch   CBC with Differential/Platelet   Comprehensive metabolic panel   Lipase, blood   Rapid urine drug screen (hospital performed)   Hepatitis B surface antigen   Rubella screen   RPR   HIV Antibody (routine testing w rflx)   CBC with Differential/Platelet   Creatinine, serum   Diet regular Room service appropriate? Yes; Fluid consistency: Thin   Notify physician (specify)   Vital signs   Defer vaginal exam for vaginal bleeding or PROM <37 weeks   Apply Antepartum Care Plan   Initiate Oral Care Protocol   Initiate Carrier Fluid Protocol   Assess fetal heart tones by doppler   Activity as tolerated   Full code   Consult to Transition of Care Team   Consult to Transition of Care Team   Type and screen   Admit to Inpatient (patient's expected length of stay will be greater than 2 midnights or inpatient only procedure)    Meds ordered this encounter  Medications   lactated ringers bolus 1,000 mL   HYDROmorphone (DILAUDID) injection 1 mg   oxyCODONE-acetaminophen (PERCOCET/ROXICET) 5-325 MG per tablet 2 tablet   cefTRIAXone (ROCEPHIN) 2 g in sodium chloride 0.9 % 100 mL IVPB    Order Specific Question:   Antibiotic Indication:    Answer:   UTI   0.9 %  sodium chloride infusion   acetaminophen (TYLENOL) tablet 650 mg   docusate sodium (COLACE) capsule 100 mg   calcium  carbonate (TUMS - dosed in mg elemental calcium) chewable tablet 400 mg of elemental calcium   prenatal multivitamin tablet 1 tablet   enoxaparin (LOVENOX) injection 40 mg   oxyCODONE (Oxy IR/ROXICODONE) immediate release tablet 5-10 mg    No evidence of preterm labor.  Hematuria and CVA tenderness noted.  Labs drawn and LR 1000 ml given .  Dilaudid 1 mg IV given.  Consult Dr Ilda Basset.  Renal US done, continued echogenic debris (seen on [place] 01/12/22) hydronephrosis, and hydroureter. Admit to May Unit for pyelonephritis.  Rocephin 2g Q 24 hours.      ASSESSMENT 1. Pyelonephritis affecting pregnancy in second trimester   2. Cocaine abuse affecting pregnancy in second trimester ([place])   3. No prenatal care in current pregnancy in second trimester   4. [redacted] weeks gestation of pregnancy     PLAN Admit to [facility] Unit     Fatima Blank Certified Nurse-Midwife 02/18/2022  7:42 PM

## 2022-02-19 DIAGNOSIS — Z8759 Personal history of other complications of pregnancy, childbirth and the puerperium: Secondary | ICD-10-CM

## 2022-02-19 DIAGNOSIS — O99012 Anemia complicating pregnancy, second trimester: Secondary | ICD-10-CM | POA: Diagnosis present

## 2022-02-19 DIAGNOSIS — Z87898 Personal history of other specified conditions: Secondary | ICD-10-CM

## 2022-02-19 LAB — CBC WITH DIFFERENTIAL/PLATELET
Abs Immature Granulocytes: 0.04 10*3/uL (ref 0.00–0.07)
Basophils Absolute: 0 10*3/uL (ref 0.0–0.1)
Basophils Relative: 0 %
Eosinophils Absolute: 0 10*3/uL (ref 0.0–0.5)
Eosinophils Relative: 0 %
HCT: 27.5 % — ABNORMAL LOW (ref 36.0–46.0)
Hemoglobin: 8.9 g/dL — ABNORMAL LOW (ref 12.0–15.0)
Immature Granulocytes: 1 %
Lymphocytes Relative: 21 %
Lymphs Abs: 1.7 10*3/uL (ref 0.7–4.0)
MCH: 25.4 pg — ABNORMAL LOW (ref 26.0–34.0)
MCHC: 32.4 g/dL (ref 30.0–36.0)
MCV: 78.3 fL — ABNORMAL LOW (ref 80.0–100.0)
Monocytes Absolute: 1 10*3/uL (ref 0.1–1.0)
Monocytes Relative: 13 %
Neutro Abs: 5.1 10*3/uL (ref 1.7–7.7)
Neutrophils Relative %: 65 %
Platelets: 263 10*3/uL (ref 150–400)
RBC: 3.51 MIL/uL — ABNORMAL LOW (ref 3.87–5.11)
RDW: 16.9 % — ABNORMAL HIGH (ref 11.5–15.5)
WBC: 7.9 10*3/uL (ref 4.0–10.5)
nRBC: 0 % (ref 0.0–0.2)

## 2022-02-19 LAB — CBC
HCT: 35 % — ABNORMAL LOW (ref 36.0–46.0)
Hemoglobin: 11 g/dL — ABNORMAL LOW (ref 12.0–15.0)
MCH: 25.3 pg — ABNORMAL LOW (ref 26.0–34.0)
MCHC: 31.4 g/dL (ref 30.0–36.0)
MCV: 80.6 fL (ref 80.0–100.0)
Platelets: 324 10*3/uL (ref 150–400)
RBC: 4.34 MIL/uL (ref 3.87–5.11)
RDW: 16.9 % — ABNORMAL HIGH (ref 11.5–15.5)
WBC: 11.9 10*3/uL — ABNORMAL HIGH (ref 4.0–10.5)
nRBC: 0 % (ref 0.0–0.2)

## 2022-02-19 LAB — COMPREHENSIVE METABOLIC PANEL
ALT: 12 U/L (ref 0–44)
AST: 15 U/L (ref 15–41)
Albumin: 2.4 g/dL — ABNORMAL LOW (ref 3.5–5.0)
Alkaline Phosphatase: 54 U/L (ref 38–126)
Anion gap: 7 (ref 5–15)
BUN: 6 mg/dL (ref 6–20)
CO2: 24 mmol/L (ref 22–32)
Calcium: 8.1 mg/dL — ABNORMAL LOW (ref 8.9–10.3)
Chloride: 107 mmol/L (ref 98–111)
Creatinine, Ser: 0.66 mg/dL (ref 0.44–1.00)
GFR, Estimated: >60 mL/min (ref >60)
Glucose, Bld: 110 mg/dL — ABNORMAL HIGH (ref 70–99)
Potassium: 4 mmol/L (ref 3.5–5.1)
Sodium: 138 mmol/L (ref 135–145)
Total Bilirubin: 0.1 mg/dL — ABNORMAL LOW (ref 0.3–1.2)
Total Protein: 5.9 g/dL — ABNORMAL LOW (ref 6.5–8.1)

## 2022-02-19 LAB — LACTIC ACID, PLASMA: Lactic Acid, Venous: 1.3 mmol/L (ref 0.5–1.9)

## 2022-02-19 LAB — RPR: RPR Ser Ql: NONREACTIVE

## 2022-02-19 MED ORDER — SODIUM CHLORIDE 0.9 % IV SOLN
500.0000 mg | Freq: Once | INTRAVENOUS | Status: AC
  Administered 2022-02-19: 500 mg via INTRAVENOUS
  Filled 2022-02-19: qty 25

## 2022-02-19 MED ORDER — DICYCLOMINE HCL 10 MG PO CAPS: 10.0000 mg | ORAL_CAPSULE | Freq: Three times a day (TID) | ORAL | Status: DC | PRN

## 2022-02-19 MED ORDER — DIPHENHYDRAMINE HCL 50 MG/ML IJ SOLN
25.0000 mg | Freq: Once | INTRAMUSCULAR | Status: DC
  Filled 2022-02-19: qty 1

## 2022-02-19 MED ORDER — BISACODYL 5 MG PO TBEC
5.0000 mg | DELAYED_RELEASE_TABLET | Freq: Every day | ORAL | Status: DC | PRN
  Filled 2022-02-19: qty 1

## 2022-02-19 MED ORDER — KETOROLAC TROMETHAMINE 30 MG/ML IJ SOLN
30.0000 mg | Freq: Once | INTRAMUSCULAR | Status: AC
  Administered 2022-02-19: 30 mg via INTRAVENOUS
  Filled 2022-02-19: qty 1

## 2022-02-19 MED ORDER — BISACODYL 5 MG PO TBEC
10.0000 mg | DELAYED_RELEASE_TABLET | Freq: Every day | ORAL | Status: DC | PRN
  Administered 2022-02-19: 10 mg via ORAL
  Filled 2022-02-19: qty 2

## 2022-02-19 NOTE — Progress Notes (Addendum)
CSW acknowledges consult. CSW received consult to meet with patient due to +UDS while pregnant. When CSW entered room, pt was groaning in pain, stating that she is constipated. CSW offered to get pt's nurse. Pt declined, stating she has been given pain medication. CSW offered to return at a later time. Pt agreed she would like to speak once her pain is under control. CSW to return at a later time.   Signed,  Berniece Salines, MSW, LCSWA, LCASA 02/19/2022 1:08 PM

## 2022-02-19 NOTE — Progress Notes (Signed)
Patient ID: Kelly Huang, female   DOB: 02-11-1986, 36 y.o.   MRN: [id number]  Called to patient bedside at 1245. Having increased abdominal pain. Feels like she needs to have a bowel movement but can't. Checked pt's cervix.   BP 98/63 (BP Location: Left Arm)   Pulse 77   Temp 98.4 F (36.9 C) (Oral)   Resp 17   Ht 5' (1.524 m)   Wt 52.1 kg   LMP 10/26/2021 (Approximate)   SpO2 98%   BMI 22.42 kg/m   Abdomen diffusely tender.  Dilation: Closed Effacement (%): Thick Cervical Position: Posterior Exam by:: Dr. Nehemiah Settle  Will give bisacodyl for constipation.  Truett Mainland, DO  Called back to patient's room around 1430. Still having pain. Toradol 30mg  IV x1 given. No change in cervix.   Patient having improvement with Toradol.  Denies any other drug use. No vaginal bleeding.  Check CBC, CMP, Lactic acid - all fairly normal.  Will watch closely.  Truett Mainland, DO

## 2022-02-19 NOTE — Progress Notes (Signed)
Daily Antepartum Note  Admission Date: 02/18/2022 Current Date: 02/19/2022   Kelly Huang is a 36 y.o. G2P0100 @ [redacted]w[redacted]d, HD#2, admitted for right sided pyelo.  Pregnancy complicated by: Patient Active Problem List   Diagnosis Date Noted   Anemia in pregnancy, second trimester 02/19/2022   History of seizure 02/19/2022   History of IUFD 02/19/2022   Pyelonephritis affecting pregnancy in second trimester 02/18/2022   Cocaine abuse affecting pregnancy in second trimester ([place]) 02/18/2022   No prenatal care in current pregnancy in second trimester 02/18/2022   UTI in pregnancy, antepartum, first trimester 02/18/2022   AMA (advanced maternal age) multigravida 35+, second trimester 02/18/2022   Cocaine use complicating pregnancy 33/29/5188   Alcohol use complicating pregnancy 41/66/0630    Overnight/24hr events:  none  Subjective:  Feeling better. No OB s/s  Objective:    Current Vital Signs 24h Vital Sign Ranges  T 99.6 F (37.6 C) Temp  Avg: 98.7 F (37.1 C)  Min: 97.8 F (36.6 C)  Max: 99.6 F (37.6 C)  BP (!) 101/52 BP  Min: 98/63  Max: 115/58  HR 89 Pulse  Avg: 84.5  Min: 77  Max: 89  RR 18 Resp  Avg: 17.6  Min: 17  Max: 18  SaO2 95 % Room Air SpO2  Avg: 98.2 %  Min: 95 %  Max: 100 %       24 Hour I/O Current Shift I/O  Time Ins Outs 09/16 0701 - 09/17 0700 In: 848.9 [I.V.:748.9] Out: 1601 [Urine:2900] 09/17 0701 - 09/17 1900 In: 0932 [P.O.:1620] Out: 3557 [Urine:1550]   Patient Vitals for the past 24 hrs:  BP Temp Temp src Pulse Resp SpO2  02/19/22 1530 (!) 101/52 99.6 F (37.6 C) Oral 89 18 95 %  02/19/22 1450 113/75 98.8 F (37.1 C) Oral 89 18 100 %  02/19/22 0807 98/63 98.4 F (36.9 C) Oral -- 17 98 %  02/19/22 0421 (!) 115/58 98.7 F (37.1 C) Oral 77 18 99 %  02/18/22 1943 (!) 111/59 97.8 F (36.6 C) Oral 83 17 99 %    Fetal Heart Tones: pending  Physical exam: General: Well nourished, well developed female in no acute distress. Abdomen: gravid  nttp Cardiovascular: S1, S2 normal, no murmur, rub or gallop, regular rate and rhythm Respiratory: CTAB Extremities: no clubbing, cyanosis or edema Skin: Warm and dry.  Back: improved right CVAT  Medications: Current Facility-Administered Medications  Medication Dose Route Frequency Provider Last Rate Last Admin   acetaminophen (TYLENOL) tablet 650 mg  650 mg Oral Q4H PRN Aletha Halim, MD       bisacodyl (DULCOLAX) EC tablet 10 mg  10 mg Oral Daily PRN Truett Mainland, DO   10 mg at 02/19/22 1254   calcium carbonate (TUMS - dosed in mg elemental calcium) chewable tablet 400 mg of elemental calcium  2 tablet Oral Q4H PRN Aletha Halim, MD       cefTRIAXone (ROCEPHIN) 2 g in sodium chloride 0.9 % 100 mL IVPB  2 g Intravenous Q24H Leftwich-Kirby, Lisa A, CNM 200 mL/hr at 02/19/22 1357 2 g at 02/19/22 1357   dicyclomine (BENTYL) capsule 10 mg  10 mg Oral TID PRN Truett Mainland, DO       diphenhydrAMINE (BENADRYL) injection 25 mg  25 mg Intravenous Once Stinson, Jacob J, DO       docusate sodium (COLACE) capsule 100 mg  100 mg Oral BID PRN Aletha Halim, MD       enoxaparin (  LOVENOX) injection 40 mg  40 mg Subcutaneous Q24H Aletha Halim, MD   40 mg at 02/18/22 1947   oxyCODONE (Oxy IR/ROXICODONE) immediate release tablet 5-10 mg  5-10 mg Oral Q6H PRN Aletha Halim, MD   10 mg at 02/19/22 1206   prenatal multivitamin tablet 1 tablet  1 tablet Oral Q1200 Aletha Halim, MD   1 tablet at 02/19/22 1618    Labs:  Ucx: pending Recent Labs  Lab 02/18/22 1053 02/19/22 0414 02/19/22 1457  WBC 10.1 7.9 11.9*  HGB 11.5* 8.9* 11.0*  HCT 35.6* 27.5* 35.0*  PLT 335 263 324    Recent Labs  Lab 02/18/22 1053 02/19/22 1457  NA 134* 138  K 4.2 4.0  CL 108 107  CO2 22 24  BUN 7 6  CREATININE 0.60 0.66  CALCIUM 8.2* 8.1*  PROT 7.5 5.9*  BILITOT 0.2* 0.1*  ALKPHOS 59 54  ALT 12 12  AST 19 15  GLUCOSE 95 110*     Radiology:  No new imaging  Assessment & Plan:  Pt  doing well *Pregnancy: qday fetal dopplers. F/u OB labs *Renal: rocephin. Consider transition to PO meds on 9/18 *PPx: OOB ad lib, lovenox *Analgesia: po prns *Social: SW consulted *Anemia: pt amenable to venofer *Dispo possibly tomorrow; pt already has 9/27 new ob visit at [facility] [place] Phone: [phone] (M-F, 0800-1700) & [phone]  (Off hours, weekends, holidays)

## 2022-02-20 LAB — GC/CHLAMYDIA PROBE AMP (~~LOC~~) NOT AT ARMC
Chlamydia: NEGATIVE
Comment: NEGATIVE
Comment: NORMAL
Neisseria Gonorrhea: NEGATIVE

## 2022-02-20 LAB — RUBELLA SCREEN: Rubella: 23.2 index (ref >0.99)

## 2022-02-20 LAB — CULTURE, OB URINE
Culture: >=100000 — AB
Special Requests: NORMAL

## 2022-02-20 MED ORDER — BISACODYL 10 MG RE SUPP
10.0000 mg | Freq: Once | RECTAL | Status: AC
  Administered 2022-02-20: 10 mg via RECTAL
  Filled 2022-02-20: qty 1

## 2022-02-20 NOTE — Progress Notes (Signed)
Patient ID: Kelly Huang, female   DOB: [birth year], 36 y.o.   MRN: [id number] FACULTY PRACTICE ANTEPARTUM(COMPREHENSIVE) NOTE  Kelly Huang is a 35 y.o. G2P0100 at [redacted]w[redacted]d by best clinical estimate who is admitted for pyelonephritis.   Fetal presentation is unsure. Length of Stay:  2  Days  ASSESSMENT: Principal Problem:   Pyelonephritis affecting pregnancy in second trimester Active Problems:   Cocaine abuse affecting pregnancy in second trimester ([facility])   No prenatal care in current pregnancy in second trimester   UTI in pregnancy, antepartum, first trimester   AMA (advanced maternal age) multigravida 35+, second trimester   Cocaine use complicating pregnancy   Alcohol use complicating pregnancy   Anemia in pregnancy, second trimester   History of seizure   History of IUFD   PLAN: Continue Abx Urine cx is growing E. Coli, sensitivities pending No fever, slight increase in WBC New Constipation - on stool softener, add suppository  Subjective: Reports feeling some better. She is passing gas but no BM Patient reports the fetal movement as  not yet . Patient reports uterine contraction  activity as none. Patient reports  vaginal bleeding as none. Patient describes fluid per vagina as None.  Vitals:  Blood pressure (!) 107/54, pulse 74, temperature 98.3 F (36.8 C), temperature source Oral, resp. rate 17, height 5' (1.524 m), weight 52.1 kg, last menstrual period 10/26/2021, SpO2 100 %. Physical Examination:  General appearance - alert, well appearing, and in no distress Chest - normal effort Abdomen - gravid, non-tender, distended Fundal Height:  size equals dates Extremities: Homans sign is negative, no sign of DVT  Membranes:intact   Labs:  Results for orders placed or performed during the hospital encounter of 02/18/22 (from the past 24 hour(s))  Comprehensive metabolic panel   Collection Time: 02/19/22  2:57 PM  Result Value Ref Range   Sodium 138 135 - 145  mmol/L   Potassium 4.0 3.5 - 5.1 mmol/L   Chloride 107 98 - 111 mmol/L   CO2 24 22 - 32 mmol/L   Glucose, Bld 110 (H) 70 - 99 mg/dL   BUN 6 6 - 20 mg/dL   Creatinine, Ser 2.02 0.44 - 1.00 mg/dL   Calcium 8.1 (L) 8.9 - 10.3 mg/dL   Total Protein 5.9 (L) 6.5 - 8.1 g/dL   Albumin 2.4 (L) 3.5 - 5.0 g/dL   AST 15 15 - 41 U/L   ALT 12 0 - 44 U/L   Alkaline Phosphatase 54 38 - 126 U/L   Total Bilirubin 0.1 (L) 0.3 - 1.2 mg/dL   GFR, Estimated >54 >27 mL/min   Anion gap 7 5 - 15  CBC   Collection Time: 02/19/22  2:57 PM  Result Value Ref Range   WBC 11.9 (H) 4.0 - 10.5 K/uL   RBC 4.34 3.87 - 5.11 MIL/uL   Hemoglobin 11.0 (L) 12.0 - 15.0 g/dL   HCT 06.2 (L) 37.6 - 28.3 %   MCV 80.6 80.0 - 100.0 fL   MCH 25.3 (L) 26.0 - 34.0 pg   MCHC 31.4 30.0 - 36.0 g/dL   RDW 15.1 (H) 76.1 - 60.7 %   Platelets 324 150 - 400 K/uL   nRBC 0.0 0.0 - 0.2 %  Lactic acid, plasma   Collection Time: 02/19/22  2:57 PM  Result Value Ref Range   Lactic Acid, Venous 1.3 0.5 - 1.9 mmol/L    Imaging Studies:    IMPRESSION: 1. Stable bilateral hydronephrosis, right greater than left. Continued echogenic  debris within the right renal collecting system. 2. Right-sided hydroureter to the level of bladder. Left ureter is not well visualized.    Medications:  Scheduled  bisacodyl  10 mg Rectal Once   diphenhydrAMINE  25 mg Intravenous Once   enoxaparin (LOVENOX) injection  40 mg Subcutaneous Q24H   prenatal multivitamin  1 tablet Oral Q1200   I have reviewed the patient's current medications.   Donnamae Jude, MD 02/20/2022,6:50 AM

## 2022-02-20 NOTE — Clinical SW OB High Risk (Signed)
OB Specialty Care  Clinical Social Worker:  Burnis Medin, [place] Date/Time:  02/20/2022, 2:06 PM Gestational Age on Admission:  36 y.o. Admitting Diagnosis:  Pyelonephritis affecting pregnancy in second trimester   Expected Delivery Date:  08/07/22  [facility] Environment  Home Address:  [address] [place], [place] [address] Phone number: [phone]  Household Member/Support Name:  Patient reported that she is currently residing with a female friend, noting it is temporary. Patient reported that she plans to go back to this residence when she is discharged.  Relationship:    Other Support:  Patient reported that her nephew, niece, and sister are supports.    Psychosocial Data  Employment:  Unemployed  Type of Work:    Education: Some Investment banker, operational to Consider  Concerns Related to Hospitalization:  Patient reported that she is in pain due to a kidney infection.    Previous Pregnancies/Feelings Towards Pregnancy?  Concerns related to being/becoming a mother?:  Patient shared that she has been an emotional wreck during this pregnancy. CSW inquired about any stressors, patient reported no current stressors and shared that she does not like to let stuff stress her out. Patient reported that prior to the hospital admission she was stressed because she didn't know what was wrong with her; but, now that she is aware of what is wrong she is no longer stressed about it. Patient shared about losing her daughter in 2013 at 7 months pregnant and shared that she anticipates increased anxiety due to her loss. CSW offered condolences for patient's loss and validated patient's feelings that she anticipates increased anxiety around that time. CSW offered emotional support. CSW inquired about how MOB has been coping with her emotions thus far, MOB reported that eating and talking to her supports have been helpful ways to cope.   Social Support (FOB? Who is/will be  helping with baby/other kids?): Immediate Family Patient shared that her FOB's name is Cristi Loron.     Recent Stressful Life Events (life changes in past year?):  Patient denied any recent stressful life events.    Prenatal Care/Education/Home Preparations: Patient reported that she has an initial prenatal care visit scheduled for 03/01/22. CSW inquired about any barriers to getting prenatal care, patient reported that transportation is a barrier because she doesn't like to depend on people. CSW asked if patient utilized the bus system, patient reported yes. CSW asked if bus passes would be helpful to get patient to her prenatal appointment, patient reported yes. CSW provided patient with 2 bus passes, patient thanked CSW. Patient reported no other barriers to prenatal care.    Domestic Violence (of any type):  No If Yes to Domestic Violence, Describe/Action Plan:     Substance Use During Pregnancy: Yes   Patient Advised/Response:   Patient endorsed cocaine and alcohol use during pregnancy, noting her last use was this past Friday morning. Patient reported that she smokes it 1-2/times a week and shared that she takes 1-2 shots a week as they go hand in hand. CSW inquired about any additional substance use, patient reported none. CSW and patient discussed the negative effects of substance use for patient and unborn child, patient verbalized understanding and reported that she plans to stop. CSW inquired about patient's interest in substance use treatment resources, patient declined and reported that she is able to stop using on her own. CSW inquired about patient's triggers to use substances, patient reported "myself". CSW asked how patient planned to stop using without utilizing treatment  resources, patient reported that she just plans to stop. CSW emphasized the importance of patient becoming sober for herself and unborn child. CSW explained that a Education officer, museum would see patient after  delivery due to her substance use during pregnancy to go over there hospital drug screen policy. Patient reported that she will be clean by then because she does not want her child going into the system. CSW encouraged patient to get clean to be able to parent.     Clinical Assessment/Plan:   CSW met with patient at bedside to complete psychosocial assessment. CSW introduced self and explained reason for consult. Patient was welcoming, pleasant, open, and remained engaged during assessment. Patient reported that she resides with a female friend, noting it is a temporary situation. Patient reported that she is interested in [facility] and food stamps. CSW agreed to complete a [facility] referral and sent patient the link to apply for food stamps. Patient confirmed that she received the link. CSW acknowledged that food insecurity was listed on patient's chart and inquired about difficulties with obtaining food. Patient reported "not really" and explained that she has help from her supports to get food. CSW asked if patient needed any local food pantry resources, patient denied needing any resources and explained that she is familiar with all the local food pantry resources as she has utilized them in the past.   CSW inquired about patient's mental health history. MOB reported that she was diagnosed with split personality bipolar disorder in 2014 or 2015. Patient described this diagnosis as having a mix depressive and manic episodes. Patient denied any auditory/visual hallucinations. CSW inquired about patient's treatment, patient reported that she does not take medication nor participate in therapy. CSW asked if patient was interested in therapy resources, patient declined. CSW assessed for safety, patient denied SI, HI, and domestic violence.   CSW inquired about any additional needs for resources/supports, patient reported that she needed clothing for herself. CSW agreed to see what was available for patient. Patient denied  any additional needs.   CSW returned to patient's room and provided available clothing items, patient thanked CSW and denied any additional needs. CSW encouraged patient to contact CSW if any needs/concerns arise. CSW provided patient with the resource for new parents.   CSW completed [facility] referral.

## 2022-02-21 ENCOUNTER — Inpatient Hospital Stay (HOSPITAL_BASED_OUTPATIENT_CLINIC_OR_DEPARTMENT_OTHER): Payer: Medicaid Other

## 2022-02-21 DIAGNOSIS — Z3A18 18 weeks gestation of pregnancy: Secondary | ICD-10-CM

## 2022-02-21 DIAGNOSIS — O0932 Supervision of pregnancy with insufficient antenatal care, second trimester: Secondary | ICD-10-CM

## 2022-02-21 DIAGNOSIS — O99312 Alcohol use complicating pregnancy, second trimester: Secondary | ICD-10-CM

## 2022-02-21 DIAGNOSIS — O35EXX0 Maternal care for other (suspected) fetal abnormality and damage, fetal genitourinary anomalies, not applicable or unspecified: Secondary | ICD-10-CM

## 2022-02-21 DIAGNOSIS — O09292 Supervision of pregnancy with other poor reproductive or obstetric history, second trimester: Secondary | ICD-10-CM

## 2022-02-21 DIAGNOSIS — O99322 Drug use complicating pregnancy, second trimester: Secondary | ICD-10-CM

## 2022-02-21 DIAGNOSIS — F1490 Cocaine use, unspecified, uncomplicated: Secondary | ICD-10-CM

## 2022-02-21 DIAGNOSIS — O09522 Supervision of elderly multigravida, second trimester: Secondary | ICD-10-CM

## 2022-02-21 DIAGNOSIS — Z3A16 16 weeks gestation of pregnancy: Secondary | ICD-10-CM

## 2022-02-21 LAB — CBC WITH DIFFERENTIAL/PLATELET
Abs Immature Granulocytes: 0.12 10*3/uL — ABNORMAL HIGH (ref 0.00–0.07)
Abs Immature Granulocytes: 0.12 10*3/uL — ABNORMAL HIGH (ref 0.00–0.07)
Basophils Absolute: 0.1 10*3/uL (ref 0.0–0.1)
Basophils Absolute: 0 10*3/uL (ref 0.0–0.1)
Basophils Relative: 1 %
Basophils Relative: 0 %
Eosinophils Absolute: 0.1 10*3/uL (ref 0.0–0.5)
Eosinophils Absolute: 0.1 10*3/uL (ref 0.0–0.5)
Eosinophils Relative: 1 %
Eosinophils Relative: 1 %
HCT: 24 % — ABNORMAL LOW (ref 36.0–46.0)
HCT: 23.5 % — ABNORMAL LOW (ref 36.0–46.0)
Hemoglobin: 7.5 g/dL — ABNORMAL LOW (ref 12.0–15.0)
Hemoglobin: 7.4 g/dL — ABNORMAL LOW (ref 12.0–15.0)
Immature Granulocytes: 2 %
Immature Granulocytes: 1 %
Lymphocytes Relative: 22 %
Lymphocytes Relative: 23 %
Lymphs Abs: 1.9 10*3/uL (ref 0.7–4.0)
Lymphs Abs: 2 10*3/uL (ref 0.7–4.0)
MCH: 25.4 pg — ABNORMAL LOW (ref 26.0–34.0)
MCH: 25.2 pg — ABNORMAL LOW (ref 26.0–34.0)
MCHC: 31.3 g/dL (ref 30.0–36.0)
MCHC: 31.5 g/dL (ref 30.0–36.0)
MCV: 81.4 fL (ref 80.0–100.0)
MCV: 79.9 fL — ABNORMAL LOW (ref 80.0–100.0)
Monocytes Absolute: 0.9 10*3/uL (ref 0.1–1.0)
Monocytes Absolute: 0.9 10*3/uL (ref 0.1–1.0)
Monocytes Relative: 11 %
Monocytes Relative: 11 %
Neutro Abs: 5.3 10*3/uL (ref 1.7–7.7)
Neutro Abs: 5.4 10*3/uL (ref 1.7–7.7)
Neutrophils Relative %: 63 %
Neutrophils Relative %: 64 %
Platelets: 252 10*3/uL (ref 150–400)
Platelets: 242 10*3/uL (ref 150–400)
RBC: 2.95 MIL/uL — ABNORMAL LOW (ref 3.87–5.11)
RBC: 2.94 MIL/uL — ABNORMAL LOW (ref 3.87–5.11)
RDW: 17.4 % — ABNORMAL HIGH (ref 11.5–15.5)
RDW: 17.5 % — ABNORMAL HIGH (ref 11.5–15.5)
WBC: 8.3 10*3/uL (ref 4.0–10.5)
WBC: 8.5 10*3/uL (ref 4.0–10.5)
nRBC: 0.8 % — ABNORMAL HIGH (ref 0.0–0.2)
nRBC: 1.1 % — ABNORMAL HIGH (ref 0.0–0.2)

## 2022-02-21 LAB — ABO/RH: ABO/RH(D): A POS

## 2022-02-21 LAB — PREPARE RBC (CROSSMATCH)

## 2022-02-21 MED ORDER — SODIUM CHLORIDE 0.9% IV SOLUTION: Freq: Once | INTRAVENOUS | Status: AC

## 2022-02-21 MED ORDER — SIMETHICONE 80 MG PO CHEW: 80.0000 mg | CHEWABLE_TABLET | Freq: Four times a day (QID) | ORAL | Status: DC | PRN

## 2022-02-21 MED ORDER — ACETAMINOPHEN 325 MG PO TABS
650.0000 mg | ORAL_TABLET | Freq: Once | ORAL | Status: AC
  Administered 2022-02-21: 650 mg via ORAL
  Filled 2022-02-21: qty 2

## 2022-02-21 MED ORDER — BISACODYL 10 MG RE SUPP: 10.0000 mg | Freq: Once | RECTAL | Status: DC

## 2022-02-21 NOTE — Progress Notes (Signed)
Patient ID: Kelly Huang, female   DOB: 1985-07-02, 36 y.o.   MRN: [id number] [facility] COMPREHENSIVE PROGRESS NOTE  Kelly Huang is a 36 y.o. G2P0100 at [redacted]w[redacted]d  who is admitted for pyelonephritis.   Fetal presentation is unsure. Length of Stay:  3  Days  Subjective: Pt continues to have flank pain R>L and some lower abd cramping.    Vitals:  Blood pressure (!) 103/55, pulse 78, temperature 98.1 F (36.7 C), temperature source Oral, resp. rate 18, height 5' (1.524 m), weight 52.1 kg, last menstrual period 10/26/2021, SpO2 96 %.  Physical Examination: Lungs clear Heart RRR Abd soft, distended GU no bleeding R & L CVA tenderness Ext non tender  Fetal Monitoring:  + FHT's  Labs:  Results for orders placed or performed during the hospital encounter of 02/18/22 (from the past 24 hour(s))  CBC with Differential/Platelet   Collection Time: 02/21/22  5:47 AM  Result Value Ref Range   WBC 8.3 4.0 - 10.5 K/uL   RBC 2.95 (L) 3.87 - 5.11 MIL/uL   Hemoglobin 7.5 (L) 12.0 - 15.0 g/dL   HCT 24.0 (L) 36.0 - 46.0 %   MCV 81.4 80.0 - 100.0 fL   MCH 25.4 (L) 26.0 - 34.0 pg   MCHC 31.3 30.0 - 36.0 g/dL   RDW 17.4 (H) 11.5 - 15.5 %   Platelets 252 150 - 400 K/uL   nRBC 0.8 (H) 0.0 - 0.2 %   Neutrophils Relative % 63 %   Neutro Abs 5.3 1.7 - 7.7 K/uL   Lymphocytes Relative 22 %   Lymphs Abs 1.9 0.7 - 4.0 K/uL   Monocytes Relative 11 %   Monocytes Absolute 0.9 0.1 - 1.0 K/uL   Eosinophils Relative 1 %   Eosinophils Absolute 0.1 0.0 - 0.5 K/uL   Basophils Relative 1 %   Basophils Absolute 0.1 0.0 - 0.1 K/uL   Immature Granulocytes 2 %   Abs Immature Granulocytes 0.12 (H) 0.00 - 0.07 K/uL  CBC with Differential/Platelet   Collection Time: 02/21/22  7:55 AM  Result Value Ref Range   WBC 8.5 4.0 - 10.5 K/uL   RBC 2.94 (L) 3.87 - 5.11 MIL/uL   Hemoglobin 7.4 (L) 12.0 - 15.0 g/dL   HCT 23.5 (L) 36.0 - 46.0 %   MCV 79.9 (L) 80.0 - 100.0 fL   MCH 25.2 (L) 26.0 - 34.0 pg    MCHC 31.5 30.0 - 36.0 g/dL   RDW 17.5 (H) 11.5 - 15.5 %   Platelets 242 150 - 400 K/uL   nRBC 1.1 (H) 0.0 - 0.2 %   Neutrophils Relative % 64 %   Neutro Abs 5.4 1.7 - 7.7 K/uL   Lymphocytes Relative 23 %   Lymphs Abs 2.0 0.7 - 4.0 K/uL   Monocytes Relative 11 %   Monocytes Absolute 0.9 0.1 - 1.0 K/uL   Eosinophils Relative 1 %   Eosinophils Absolute 0.1 0.0 - 0.5 K/uL   Basophils Relative 0 %   Basophils Absolute 0.0 0.0 - 0.1 K/uL   Immature Granulocytes 1 %   Abs Immature Granulocytes 0.12 (H) 0.00 - 0.07 K/uL    Imaging Studies:    NA   Medications:  Scheduled  sodium chloride   Intravenous Once   acetaminophen  650 mg Oral Once   bisacodyl  10 mg Rectal Once   diphenhydrAMINE  25 mg Intravenous Once   prenatal multivitamin  1 tablet Oral Q1200   I have reviewed  the patient's current medications.  ASSESSMENT: IUP 16 6/7 weeks Pyelonephritis No PNC Anemia   PLAN: Stable. Continue with Rocephin. Remains afebrile. Will transfuse. Blood transfusion reviewed with pt. Indication discussed. R/B reviewed. Pt agreeable. Will stimulate bowels. Will repeat OB U/S enlight of drop in Hgb. However, I suspect that this is pt's baseline, no prior 11. Continue routine antenatal care.   Hermina Staggers 02/21/2022,10:27 AM

## 2022-02-21 NOTE — Care Management Note (Signed)
Case Management Note  Patient Details  Name: Kelly Huang MRN: [id number] Date of Birth: 11/27/1985  Subjective/Objective:                 Pyleonephritis in pregnancy   ACTION- MATCH; Development worker, community;     Additional Comments: CM completed MATCH for patient to received medications prior to discharge, patient has no active insurance.  TOC's pharmacist made aware and they will be delivered to patient's room prior to discharge. Financial Counselor made aware to screen patient at [facility].   Yong Channel, RN 02/21/2022, 11:34 AM

## 2022-02-22 ENCOUNTER — Telehealth: Payer: Medicaid Other

## 2022-02-22 ENCOUNTER — Other Ambulatory Visit (HOSPITAL_COMMUNITY): Payer: Self-pay

## 2022-02-22 LAB — CBC WITH DIFFERENTIAL/PLATELET
Abs Immature Granulocytes: 0.14 10*3/uL — ABNORMAL HIGH (ref 0.00–0.07)
Basophils Absolute: 0 10*3/uL (ref 0.0–0.1)
Basophils Relative: 0 %
Eosinophils Absolute: 0 10*3/uL (ref 0.0–0.5)
Eosinophils Relative: 0 %
HCT: 31 % — ABNORMAL LOW (ref 36.0–46.0)
Hemoglobin: 10.5 g/dL — ABNORMAL LOW (ref 12.0–15.0)
Immature Granulocytes: 1 %
Lymphocytes Relative: 10 %
Lymphs Abs: 1.3 10*3/uL (ref 0.7–4.0)
MCH: 27.1 pg (ref 26.0–34.0)
MCHC: 33.9 g/dL (ref 30.0–36.0)
MCV: 79.9 fL — ABNORMAL LOW (ref 80.0–100.0)
Monocytes Absolute: 1.1 10*3/uL — ABNORMAL HIGH (ref 0.1–1.0)
Monocytes Relative: 9 %
Neutro Abs: 10.2 10*3/uL — ABNORMAL HIGH (ref 1.7–7.7)
Neutrophils Relative %: 80 %
Platelets: 272 10*3/uL (ref 150–400)
RBC: 3.88 MIL/uL (ref 3.87–5.11)
RDW: 17.3 % — ABNORMAL HIGH (ref 11.5–15.5)
WBC: 12.8 10*3/uL — ABNORMAL HIGH (ref 4.0–10.5)
nRBC: 0.5 % — ABNORMAL HIGH (ref 0.0–0.2)

## 2022-02-22 MED ORDER — PRENATAL MULTIVITAMIN CH
1.0000 | ORAL_TABLET | Freq: Every day | ORAL | 4 refills | Status: DC
  Filled 2022-02-22: qty 30, 30d supply, fill #0

## 2022-02-22 MED ORDER — CEPHALEXIN 500 MG PO CAPS
500.0000 mg | ORAL_CAPSULE | Freq: Three times a day (TID) | ORAL | 0 refills | Status: AC
  Filled 2022-02-22: qty 30, 10d supply, fill #0

## 2022-02-22 MED ORDER — OXYCODONE HCL 5 MG PO TABS
5.0000 mg | ORAL_TABLET | Freq: Four times a day (QID) | ORAL | 0 refills | Status: DC | PRN
  Filled 2022-02-22: qty 21, 3d supply, fill #0

## 2022-02-22 MED ORDER — IBUPROFEN 600 MG PO TABS
600.0000 mg | ORAL_TABLET | Freq: Four times a day (QID) | ORAL | 1 refills | Status: DC | PRN
  Filled 2022-02-22: qty 30, 8d supply, fill #0

## 2022-02-22 NOTE — Discharge Summary (Signed)
Patient ID: Bird Tailor MRN: [id number] DOB/AGE: 12-25-1985 36 y.o.  Admit date: 02/18/2022 Discharge date: 02/22/2022  Admission Diagnoses: IUP 18 + weeks, pyelonephritis, no prenatal care, anemia  Discharge Diagnoses: SAA, undelivered.   Prenatal Procedures: ultrasound   Hospital Course:  This is a 36 y.o. G2P0100 with IUP at [redacted]w[redacted]d admitted for pyelonephritis. She was placed on IV antibiotics with resolution of her CVA tenderness. Switched to oral antibiotics as indicated by urine culture sensitivities. She was given 2 units of PRBC's due to her anemia and to promote recovery from her pyelonephritis. Fetal well being remained reassuring during her stay. She was tolerating diet, voiding without problems.  She was deemed stable for discharge to home with outpatient follow up.  Discharge Exam: Temp:  [97.8 F (36.6 C)-99 F (37.2 C)] 98.3 F (36.8 C) (09/20 0924) Pulse Rate:  [68-110] 68 (09/20 0924) Resp:  [16] 16 (09/20 0924) BP: (92-114)/(48-69) 101/58 (09/20 0924) SpO2:  [98 %-100 %] 100 % (09/20 0924) Physical Examination: CONSTITUTIONAL: Well-developed, well-nourished female in no acute distress.  HENT:  Normocephalic, atraumatic, External right and left ear normal. Oropharynx is clear and moist EYES: Conjunctivae and EOM are normal. Pupils are equal, round, and reactive to light. No scleral icterus.  NECK: Normal range of motion, supple, no masses SKIN: Skin is warm and dry. No rash noted. Not diaphoretic. No erythema. No pallor. NEUROLGIC: Alert and oriented to person, place, and time. Normal reflexes, muscle tone coordination. No cranial nerve deficit noted. PSYCHIATRIC: Normal mood and affect. Normal behavior. Normal judgment and thought content. CARDIOVASCULAR: Normal heart rate noted, regular rhythm RESPIRATORY: Effort and breath sounds normal, no problems with respiration noted MUSCULOSKELETAL: Normal range of motion. No edema and no tenderness. 2+ distal  pulses. ABDOMEN: Soft, nontender, nondistended, gravid. CERVIX: Dilation: Closed Effacement (%): Thick Cervical Position: Posterior Exam by:: Dr. Adrian Blackwater  Fetal monitoring: Positive  Significant Diagnostic Studies:  Results for orders placed or performed during the hospital encounter of 02/18/22 (from the past 168 hour(s))  Wet prep, genital   Collection Time: 02/18/22 10:44 AM  Result Value Ref Range   Yeast Wet Prep HPF POC NONE SEEN NONE SEEN   Trich, Wet Prep NONE SEEN NONE SEEN   Clue Cells Wet Prep HPF POC PRESENT (A) NONE SEEN   WBC, Wet Prep HPF POC <10 <10   Sperm NONE SEEN   Urinalysis, Routine w reflex microscopic Urine, Clean Catch   Collection Time: 02/18/22 10:44 AM  Result Value Ref Range   Color, Urine AMBER (A) YELLOW   APPearance TURBID (A) CLEAR   Specific Gravity, Urine 1.017 1.005 - 1.030   pH 6.0 5.0 - 8.0   Glucose, UA NEGATIVE NEGATIVE mg/dL   Hgb urine dipstick SMALL (A) NEGATIVE   Bilirubin Urine NEGATIVE NEGATIVE   Ketones, ur NEGATIVE NEGATIVE mg/dL   Protein, ur 322 (A) NEGATIVE mg/dL   Nitrite NEGATIVE NEGATIVE   Leukocytes,Ua LARGE (A) NEGATIVE   RBC / HPF >50 (H) 0 - 5 RBC/hpf   WBC, UA >50 (H) 0 - 5 WBC/hpf   Bacteria, UA NONE SEEN NONE SEEN   WBC Clumps PRESENT    Mucus PRESENT   Rapid urine drug screen (hospital performed)   Collection Time: 02/18/22 10:44 AM  Result Value Ref Range   Opiates NONE DETECTED NONE DETECTED   Cocaine POSITIVE (A) NONE DETECTED   Benzodiazepines NONE DETECTED NONE DETECTED   Amphetamines NONE DETECTED NONE DETECTED   Tetrahydrocannabinol NONE DETECTED NONE DETECTED  Barbiturates NONE DETECTED NONE DETECTED  GC/Chlamydia probe amp ([place])not at [facility]   Collection Time: 02/18/22 10:46 AM  Result Value Ref Range   Neisseria Gonorrhea Negative    Chlamydia Negative    Comment Normal Reference [place] Chlamydia - Negative    Comment      Normal Reference Range Neisseria Gonorrhea - Negative  CBC  with Differential/Platelet   Collection Time: 02/18/22 10:53 AM  Result Value Ref Range   WBC 10.1 4.0 - 10.5 K/uL   RBC 4.52 3.87 - 5.11 MIL/uL   Hemoglobin 11.5 (L) 12.0 - 15.0 g/dL   HCT 94.7 (L) 65.4 - 65.0 %   MCV 78.8 (L) 80.0 - 100.0 fL   MCH 25.4 (L) 26.0 - 34.0 pg   MCHC 32.3 30.0 - 36.0 g/dL   RDW 35.4 (H) 65.6 - 81.2 %   Platelets 335 150 - 400 K/uL   nRBC 0.0 0.0 - 0.2 %   Neutrophils Relative % 78 %   Neutro Abs 7.9 (H) 1.7 - 7.7 K/uL   Lymphocytes Relative 13 %   Lymphs Abs 1.3 0.7 - 4.0 K/uL   Monocytes Relative 8 %   Monocytes Absolute 0.8 0.1 - 1.0 K/uL   Eosinophils Relative 0 %   Eosinophils Absolute 0.0 0.0 - 0.5 K/uL   Basophils Relative 0 %   Basophils Absolute 0.0 0.0 - 0.1 K/uL   Immature Granulocytes 1 %   Abs Immature Granulocytes 0.07 0.00 - 0.07 K/uL  Comprehensive metabolic panel   Collection Time: 02/18/22 10:53 AM  Result Value Ref Range   Sodium 134 (L) 135 - 145 mmol/L   Potassium 4.2 3.5 - 5.1 mmol/L   Chloride 108 98 - 111 mmol/L   CO2 22 22 - 32 mmol/L   Glucose, Bld 95 70 - 99 mg/dL   BUN 7 6 - 20 mg/dL   Creatinine, Ser 7.51 0.44 - 1.00 mg/dL   Calcium 8.2 (L) 8.9 - 10.3 mg/dL   Total Protein 7.5 6.5 - 8.1 g/dL   Albumin 3.4 (L) 3.5 - 5.0 g/dL   AST 19 15 - 41 U/L   ALT 12 0 - 44 U/L   Alkaline Phosphatase 59 38 - 126 U/L   Total Bilirubin 0.2 (L) 0.3 - 1.2 mg/dL   GFR, Estimated >70 >01 mL/min   Anion gap 4 (L) 5 - 15  Lipase, blood   Collection Time: 02/18/22 10:53 AM  Result Value Ref Range   Lipase 23 11 - 51 U/L  Hepatitis B surface antigen   Collection Time: 02/18/22 10:53 AM  Result Value Ref Range   Hepatitis B Surface Ag NON REACTIVE NON REACTIVE  Rubella screen   Collection Time: 02/18/22 10:53 AM  Result Value Ref Range   Rubella 23.20 Immune >0.99 index  RPR   Collection Time: 02/18/22 10:53 AM  Result Value Ref Range   RPR Ser Ql NON REACTIVE NON REACTIVE  HIV Antibody (routine testing w rflx)   Collection  Time: 02/18/22 10:53 AM  Result Value Ref Range   HIV Screen 4th Generation wRfx Non Reactive Non Reactive  Type and screen   Collection Time: 02/18/22 10:53 AM  Result Value Ref Range   ABO/RH(D) A POS    Antibody Screen NEG    Sample Expiration 02/21/2022,2359    Unit Number [id number]    Blood Component Type RBC, LR IRR    Unit division 00    Status of Unit ISSUED,FINAL    Transfusion  Status OK TO TRANSFUSE    Crossmatch Result Compatible    Unit Number [id number]    Blood Component Type RED CELLS,LR    Unit division 00    Status of Unit ISSUED,FINAL    Transfusion Status OK TO TRANSFUSE    Crossmatch Result      Compatible Performed at [facility], [address] [place], [place] [address]   [facility]   Collection Time: 02/18/22 10:53 AM  Result Value Ref Range   ISSUE DATE / TIME [id number]    Blood Product Unit Number [id number]    PRODUCT CODE X3244W10    Unit Type and Rh 6200    Blood Product Expiration Date 272536644034    ISSUE DATE / TIME [id number]    Blood Product Unit Number [id number]    PRODUCT CODE A6301S01    Unit Type and Rh 6200    Blood Product Expiration Date 093235573220   Culture, OB Urine   Collection Time: 02/18/22  3:32 PM   Specimen: OB Clean Catch; Urine  Result Value Ref Range   Specimen Description OB CLEAN CATCH    Special Requests Normal    Culture (A)     >=100,000 COLONIES/mL ESCHERICHIA COLI NO GROUP B STREP (S.AGALACTIAE) ISOLATED Performed at [facility], [address] [place], [place] [address]    Report Status 02/20/2022 FINAL    Organism ID, Bacteria ESCHERICHIA COLI (A)       Susceptibility   Escherichia coli - MIC*    AMPICILLIN 8 SENSITIVE Sensitive     CEFAZOLIN <=4 SENSITIVE Sensitive     CEFEPIME <=0.12 SENSITIVE Sensitive     CEFTAZIDIME <=1 SENSITIVE Sensitive     CEFTRIAXONE <=0.25 SENSITIVE Sensitive     CIPROFLOXACIN <=0.25 SENSITIVE Sensitive     GENTAMICIN <=1 SENSITIVE  Sensitive     IMIPENEM <=0.25 SENSITIVE Sensitive     TRIMETH/SULFA <=20 SENSITIVE Sensitive     AMPICILLIN/SULBACTAM 4 SENSITIVE Sensitive     PIP/TAZO <=4 SENSITIVE Sensitive     * >=100,000 COLONIES/mL ESCHERICHIA COLI  CBC with Differential/Platelet   Collection Time: 02/19/22  4:14 AM  Result Value Ref Range   WBC 7.9 4.0 - 10.5 K/uL   RBC 3.51 (L) 3.87 - 5.11 MIL/uL   Hemoglobin 8.9 (L) 12.0 - 15.0 g/dL   HCT 06.2 (L) 37.6 - 28.3 %   MCV 78.3 (L) 80.0 - 100.0 fL   MCH 25.4 (L) 26.0 - 34.0 pg   MCHC 32.4 30.0 - 36.0 g/dL   RDW 15.1 (H) 76.1 - 60.7 %   Platelets 263 150 - 400 K/uL   nRBC 0.0 0.0 - 0.2 %   Neutrophils Relative % 65 %   Neutro Abs 5.1 1.7 - 7.7 K/uL   Lymphocytes Relative 21 %   Lymphs Abs 1.7 0.7 - 4.0 K/uL   Monocytes Relative 13 %   Monocytes Absolute 1.0 0.1 - 1.0 K/uL   Eosinophils Relative 0 %   Eosinophils Absolute 0.0 0.0 - 0.5 K/uL   Basophils Relative 0 %   Basophils Absolute 0.0 0.0 - 0.1 K/uL   Immature Granulocytes 1 %   Abs Immature Granulocytes 0.04 0.00 - 0.07 K/uL  Comprehensive metabolic panel   Collection Time: 02/19/22  2:57 PM  Result Value Ref Range   Sodium 138 135 - 145 mmol/L   Potassium 4.0 3.5 - 5.1 mmol/L   Chloride 107 98 - 111 mmol/L   CO2 24 22 - 32 mmol/L  Glucose, Bld 110 (H) 70 - 99 mg/dL   BUN 6 6 - 20 mg/dL   Creatinine, Ser 0.980.66 0.44 - 1.00 mg/dL   Calcium 8.1 (L) 8.9 - 10.3 mg/dL   Total Protein 5.9 (L) 6.5 - 8.1 g/dL   Albumin 2.4 (L) 3.5 - 5.0 g/dL   AST 15 15 - 41 U/L   ALT 12 0 - 44 U/L   Alkaline Phosphatase 54 38 - 126 U/L   Total Bilirubin 0.1 (L) 0.3 - 1.2 mg/dL   GFR, Estimated >11>60 >91>60 mL/min   Anion gap 7 5 - 15  CBC   Collection Time: 02/19/22  2:57 PM  Result Value Ref Range   WBC 11.9 (H) 4.0 - 10.5 K/uL   RBC 4.34 3.87 - 5.11 MIL/uL   Hemoglobin 11.0 (L) 12.0 - 15.0 g/dL   HCT 47.835.0 (L) 29.536.0 - 62.146.0 %   MCV 80.6 80.0 - 100.0 fL   MCH 25.3 (L) 26.0 - 34.0 pg   MCHC 31.4 30.0 - 36.0 g/dL    RDW 30.816.9 (H) 65.711.5 - 15.5 %   Platelets 324 150 - 400 K/uL   nRBC 0.0 0.0 - 0.2 %  Lactic acid, plasma   Collection Time: 02/19/22  2:57 PM  Result Value Ref Range   Lactic Acid, Venous 1.3 0.5 - 1.9 mmol/L  CBC with Differential/Platelet   Collection Time: 02/21/22  5:47 AM  Result Value Ref Range   WBC 8.3 4.0 - 10.5 K/uL   RBC 2.95 (L) 3.87 - 5.11 MIL/uL   Hemoglobin 7.5 (L) 12.0 - 15.0 g/dL   HCT 84.624.0 (L) 96.236.0 - 95.246.0 %   MCV 81.4 80.0 - 100.0 fL   MCH 25.4 (L) 26.0 - 34.0 pg   MCHC 31.3 30.0 - 36.0 g/dL   RDW 84.117.4 (H) 32.411.5 - 40.115.5 %   Platelets 252 150 - 400 K/uL   nRBC 0.8 (H) 0.0 - 0.2 %   Neutrophils Relative % 63 %   Neutro Abs 5.3 1.7 - 7.7 K/uL   Lymphocytes Relative 22 %   Lymphs Abs 1.9 0.7 - 4.0 K/uL   Monocytes Relative 11 %   Monocytes Absolute 0.9 0.1 - 1.0 K/uL   Eosinophils Relative 1 %   Eosinophils Absolute 0.1 0.0 - 0.5 K/uL   Basophils Relative 1 %   Basophils Absolute 0.1 0.0 - 0.1 K/uL   Immature Granulocytes 2 %   Abs Immature Granulocytes 0.12 (H) 0.00 - 0.07 K/uL  ABO/Rh   Collection Time: 02/21/22  7:52 AM  Result Value Ref Range   ABO/RH(D)      A POS Performed at [facility], [address] [place], [place] [address]   CBC with Differential/Platelet   Collection Time: 02/21/22  7:55 AM  Result Value Ref Range   WBC 8.5 4.0 - 10.5 K/uL   RBC 2.94 (L) 3.87 - 5.11 MIL/uL   Hemoglobin 7.4 (L) 12.0 - 15.0 g/dL   HCT 36.623.5 (L) 44.036.0 - 34.746.0 %   MCV 79.9 (L) 80.0 - 100.0 fL   MCH 25.2 (L) 26.0 - 34.0 pg   MCHC 31.5 30.0 - 36.0 g/dL   RDW 42.517.5 (H) 95.611.5 - 38.715.5 %   Platelets 242 150 - 400 K/uL   nRBC 1.1 (H) 0.0 - 0.2 %   Neutrophils Relative % 64 %   Neutro Abs 5.4 1.7 - 7.7 K/uL   Lymphocytes Relative 23 %   Lymphs Abs 2.0 0.7 - 4.0  K/uL   Monocytes Relative 11 %   Monocytes Absolute 0.9 0.1 - 1.0 K/uL   Eosinophils Relative 1 %   Eosinophils Absolute 0.1 0.0 - 0.5 K/uL   Basophils Relative 0 %   Basophils Absolute 0.0 0.0 - 0.1  K/uL   Immature Granulocytes 1 %   Abs Immature Granulocytes 0.12 (H) 0.00 - 0.07 K/uL  Prepare RBC (crossmatch)   Collection Time: 02/21/22 10:26 AM  Result Value Ref Range   Order Confirmation      ORDER PROCESSED BY BLOOD BANK Performed at [facility], [place] [address] [place], [place] [address]   CBC with Differential/Platelet   Collection Time: 02/22/22  5:42 AM  Result Value Ref Range   WBC 12.8 (H) 4.0 - 10.5 K/uL   RBC 3.88 3.87 - 5.11 MIL/uL   Hemoglobin 10.5 (L) 12.0 - 15.0 g/dL   HCT 31.0 (L) 36.0 - 46.0 %   MCV 79.9 (L) 80.0 - 100.0 fL   MCH 27.1 26.0 - 34.0 pg   MCHC 33.9 30.0 - 36.0 g/dL   RDW 17.3 (H) 11.5 - 15.5 %   Platelets 272 150 - 400 K/uL   nRBC 0.5 (H) 0.0 - 0.2 %   Neutrophils Relative % 80 %   Neutro Abs 10.2 (H) 1.7 - 7.7 K/uL   Lymphocytes Relative 10 %   Lymphs Abs 1.3 0.7 - 4.0 K/uL   Monocytes Relative 9 %   Monocytes Absolute 1.1 (H) 0.1 - 1.0 K/uL   Eosinophils Relative 0 %   Eosinophils Absolute 0.0 0.0 - 0.5 K/uL   Basophils Relative 0 %   Basophils Absolute 0.0 0.0 - 0.1 K/uL   Immature Granulocytes 1 %   Abs Immature Granulocytes 0.14 (H) 0.00 - 0.07 K/uL    Discharge Condition: Stable  Disposition: Discharge disposition: 01-Home or Self Care        Discharge Instructions     Discharge activity:  No Restrictions   Complete by: As directed    Discharge diet:  No restrictions   Complete by: As directed    No sexual activity restrictions   Complete by: As directed       Allergies as of 02/22/2022       Reactions   Bactrim [sulfamethoxazole-trimethoprim] Other (See Comments)   Patient is not sure of reaction. She stated that she was having dizzy spells.        Medication List     STOP taking these medications    albuterol 108 (90 Base) MCG/ACT inhaler Commonly known as: VENTOLIN HFA   clotrimazole-betamethasone cream Commonly known as: LOTRISONE   diphenhydramine-acetaminophen 25-500 MG Tabs  tablet Commonly known as: TYLENOL PM   naproxen 500 MG tablet Commonly known as: NAPROSYN       TAKE these medications    acetaminophen 500 MG tablet Commonly known as: TYLENOL Take 1 tablet (500 mg total) by mouth every 6 (six) hours as needed.   cephALEXin 500 MG capsule Commonly known as: KEFLEX Take 1 capsule (500 mg total) by mouth 3 (three) times daily for 10 days. What changed: when to take this   cyclobenzaprine 10 MG tablet Commonly known as: FLEXERIL Take 1 tablet (10 mg total) by mouth 2 (two) times daily as needed for muscle spasms.   hydrOXYzine 25 MG tablet Commonly known as: ATARAX Take 1 tablet (25 mg total) by mouth every 8 (eight) hours as needed for anxiety.   ibuprofen 600 MG tablet Commonly known as:  ADVIL Take 1 tablet (600 mg total) by mouth every 6 (six) hours as needed.   oxyCODONE 5 MG immediate release tablet Commonly known as: Oxy IR/ROXICODONE Take 1-2 tablets (5-10 mg total) by mouth every 6 (six) hours as needed for severe pain.   prenatal multivitamin Tabs tablet Take 1 tablet by mouth daily at 12 noon.        Follow-up Information     [facility] Follow up.   Specialty: Obstetrics and Gynecology Why: Pt already has OB appt 03/01/22 Contact information: [address] [place] [address] [phone]                Signed: Hermina Staggers M.D. 02/22/2022, 9:40 AM

## 2022-02-22 NOTE — Plan of Care (Signed)

## 2022-02-23 LAB — TYPE AND SCREEN
ABO/RH(D): A POS
Antibody Screen: NEGATIVE
Unit division: 0
Unit division: 0

## 2022-02-23 LAB — BPAM RBC
Blood Product Expiration Date: 202309262359
Blood Product Expiration Date: 202310122359
ISSUE DATE / TIME: 202309191134
ISSUE DATE / TIME: 202309191520
Unit Type and Rh: 6200
Unit Type and Rh: 6200

## 2022-03-01 ENCOUNTER — Encounter: Payer: Self-pay | Admitting: Obstetrics and Gynecology

## 2022-03-01 ENCOUNTER — Other Ambulatory Visit (HOSPITAL_COMMUNITY)
Admission: RE | Admit: 2022-03-01 | Discharge: 2022-03-01 | Disposition: A | Discharge status: Home / Self Care | Payer: Medicaid Other | Source: Ambulatory Visit | Attending: Obstetrics and Gynecology | Admitting: Obstetrics and Gynecology

## 2022-03-01 ENCOUNTER — Ambulatory Visit (INDEPENDENT_AMBULATORY_CARE_PROVIDER_SITE_OTHER): Payer: Self-pay | Admitting: Obstetrics and Gynecology

## 2022-03-01 VITALS — BP 105/60 | HR 78 | Wt 112.8 lb

## 2022-03-01 DIAGNOSIS — Z8759 Personal history of other complications of pregnancy, childbirth and the puerperium: Secondary | ICD-10-CM

## 2022-03-01 DIAGNOSIS — O99012 Anemia complicating pregnancy, second trimester: Secondary | ICD-10-CM

## 2022-03-01 DIAGNOSIS — O0990 Supervision of high risk pregnancy, unspecified, unspecified trimester: Secondary | ICD-10-CM

## 2022-03-01 DIAGNOSIS — F1410 Cocaine abuse, uncomplicated: Secondary | ICD-10-CM

## 2022-03-01 DIAGNOSIS — J45909 Unspecified asthma, uncomplicated: Secondary | ICD-10-CM | POA: Insufficient documentation

## 2022-03-01 DIAGNOSIS — Z3A19 19 weeks gestation of pregnancy: Secondary | ICD-10-CM

## 2022-03-01 DIAGNOSIS — O2302 Infections of kidney in pregnancy, second trimester: Secondary | ICD-10-CM

## 2022-03-01 DIAGNOSIS — Z87898 Personal history of other specified conditions: Secondary | ICD-10-CM

## 2022-03-01 DIAGNOSIS — O2240 Hemorrhoids in pregnancy, unspecified trimester: Secondary | ICD-10-CM | POA: Insufficient documentation

## 2022-03-01 DIAGNOSIS — O09522 Supervision of elderly multigravida, second trimester: Secondary | ICD-10-CM

## 2022-03-01 DIAGNOSIS — O99322 Drug use complicating pregnancy, second trimester: Secondary | ICD-10-CM

## 2022-03-01 DIAGNOSIS — O99312 Alcohol use complicating pregnancy, second trimester: Secondary | ICD-10-CM

## 2022-03-01 DIAGNOSIS — O99512 Diseases of the respiratory system complicating pregnancy, second trimester: Secondary | ICD-10-CM

## 2022-03-01 DIAGNOSIS — O2242 Hemorrhoids in pregnancy, second trimester: Secondary | ICD-10-CM

## 2022-03-01 HISTORY — DX: Unspecified asthma, uncomplicated: J45.909

## 2022-03-01 MED ORDER — ALBUTEROL SULFATE HFA 108 (90 BASE) MCG/ACT IN AERS: 2.0000 | INHALATION_SPRAY | Freq: Four times a day (QID) | RESPIRATORY_TRACT | 2 refills | Status: DC | PRN

## 2022-03-01 NOTE — Progress Notes (Signed)
INITIAL PRENATAL VISIT NOTE  Subjective:  Kelly Huang is a 36 y.o. G2P0100 at [redacted]w[redacted]d by ultrasound being seen today for her initial prenatal visit. She has an obstetric history significant for IUFD at 7 months delivered vaginally. She has a medical history significant for SUD including cocaine and alcohol. Pt also has history of asthma and possibly seizures. She states she has been seen by several specialists and has never been fully diagnosed with seizure disorder or placed on anti-seizure medication. She also was hospitalized for treatment of pyelonephritis in pregnancy treated with IV antibiotics.  She now continues with suppression using keflex.  Pt denies current alcohol use , but has had recent cocaine use.  Pt strongly advised to discontinue cocaine use.  Patient reports no complaints.  Contractions: Not present. Vag. Bleeding: None.  Movement: Present. Denies leaking of fluid.    Past Medical History:  Diagnosis Date   Asthma    Herniated lumbar intervertebral disc    Seizures ([place])     History reviewed. No pertinent surgical history.  OB History  Gravida Para Term Preterm AB Living  2 1   1       SAB IAB Ectopic Multiple Live Births               # Outcome Date GA Lbr Len/2nd Weight Sex Delivery Anes PTL Lv  2 Current           1 Preterm 2013     Vag-Spont   FD    Obstetric Comments  IUFD 7 months    Social History   Socioeconomic History   Marital status: Single    Spouse name: Not on file   Number of children: Not on file   Years of education: Not on file   Highest education level: Not on file  Occupational History   Not on file  Tobacco Use   Smoking status: Every Day    Packs/day: 0.25    Types: Cigarettes   Smokeless tobacco: Never  Vaping Use   Vaping Use: Never used  Substance and Sexual Activity   Alcohol use: Yes    Alcohol/week: 1.0 standard drink of alcohol    Types: 1 Shots of liquor per week   Drug use: Yes    Types: Cocaine     Comment: last used 02/17/22   Sexual activity: Yes    Birth control/protection: None, Condom  Other Topics Concern   Not on file  Social History Narrative   Not on file   Social Determinants of Health   Financial Resource Strain: Not on file  Food Insecurity: Food Insecurity Present (02/21/2022)   Hunger Vital Sign    Worried About Running Out of Food in the Last Year: Sometimes true    Ran Out of Food in the Last Year: Sometimes true  Transportation Needs: Unmet Transportation Needs (02/21/2022)   PRAPARE - Hydrologist (Medical): Yes    Lack of Transportation (Non-Medical): Yes  Physical Activity: Not on file  Stress: Not on file  Social Connections: Not on file    History reviewed. No pertinent family history.   Current Outpatient Medications:    acetaminophen (TYLENOL) 500 MG tablet, Take 1 tablet (500 mg total) by mouth every 6 (six) hours as needed., Disp: 30 tablet, Rfl: 0   albuterol (VENTOLIN HFA) 108 (90 Base) MCG/ACT inhaler, Inhale 2 puffs into the lungs every 6 (six) hours as needed for wheezing or shortness of breath., Disp: 8  g, Rfl: 2   cephALEXin (KEFLEX) 500 MG capsule, Take 1 capsule (500 mg total) by mouth 3 (three) times daily for 10 days., Disp: 30 capsule, Rfl: 0   hydrOXYzine (ATARAX/VISTARIL) 25 MG tablet, Take 1 tablet (25 mg total) by mouth every 8 (eight) hours as needed for anxiety., Disp: 12 tablet, Rfl: 0   oxyCODONE (OXY IR/ROXICODONE) 5 MG immediate release tablet, Take 1-2 tablets (5-10 mg total) by mouth every 6 (six) hours as needed for severe pain., Disp: 21 tablet, Rfl: 0   Prenatal Vit-Fe Fumarate-FA (PRENATAL MULTIVITAMIN) TABS tablet, Take 1 tablet by mouth daily at 12 noon., Disp: 90 tablet, Rfl: 4   cyclobenzaprine (FLEXERIL) 10 MG tablet, Take 1 tablet (10 mg total) by mouth 2 (two) times daily as needed for muscle spasms. (Patient not taking: Reported on 03/01/2022), Disp: 20 tablet, Rfl: 0   ibuprofen (ADVIL) 600  MG tablet, Take 1 tablet (600 mg total) by mouth every 6 (six) hours as needed. (Patient not taking: Reported on 03/01/2022), Disp: 30 tablet, Rfl: 1  Allergies  Allergen Reactions   Bactrim [Sulfamethoxazole-Trimethoprim] Other (See Comments)    Patient is not sure of reaction. She stated that she was having dizzy spells.    Review of Systems: Negative except for what is mentioned in HPI.  Objective:   Vitals:   03/01/22 1411  BP: 105/60  Pulse: 78  Weight: 112 lb 12.8 oz (51.2 kg)    Fetal Status: Fetal Heart Rate (bpm): 150 Fundal Height: 20 cm Movement: Present     Physical Exam: BP 105/60   Pulse 78   Wt 112 lb 12.8 oz (51.2 kg)   LMP 10/26/2021 (Approximate)   BMI 22.03 kg/m  CONSTITUTIONAL: Well-developed, slightly malnourished appearing female female in no acute distress.  NEUROLOGIC: Alert and oriented to person, place, and time. Normal reflexes, muscle tone coordination. No cranial nerve deficit noted. PSYCHIATRIC: Normal mood and affect. Normal behavior. Normal judgment and thought content. SKIN: Skin is warm and dry. No rash noted. Not diaphoretic. No erythema. No pallor. HENT:  Normocephalic, atraumatic, External right and left ear normal. Oropharynx is clear and moist EYES: Conjunctivae and EOM are normal. NECK: Normal range of motion, supple, no masses CARDIOVASCULAR: Normal heart rate noted, regular rhythm RESPIRATORY: Effort and breath sounds normal, no problems with respiration noted BREASTS: deferred ABDOMEN: Soft, nontender, nondistended, gravid. GU: normal appearing external female genitalia, multiparous, normal appearing cervix, large amount of white discharge in vagina, no lesions noted Large possibly thrombosed hemorrhoids noted, nontender to touch Bimanual: 20 weeks sized uterus, no adnexal tenderness or palpable lesions noted MUSCULOSKELETAL: Normal range of motion.  No CVA tenderness noted bilaterally EXT:  No edema and no tenderness. 2+ distal  pulses.   Assessment and Plan:  Pregnancy: G2P0100 at [redacted]w[redacted]d by ultrasound  1. Supervision of high risk pregnancy, antepartum Continue routine prenatal care Labs drawn today  - Culture, OB Urine - CBC/D/Plt+RPR+Rh+ABO+RubIgG... - [place] MFM OB DETAIL +14 WK; Future - Hemoglobin A1c - Panorama Prenatal Test Full Panel - [place] Custom - Cytology - PAP( [place]) - albuterol (VENTOLIN HFA) 108 (90 Base) MCG/ACT inhaler; Inhale 2 puffs into the lungs every 6 (six) hours as needed for wheezing or shortness of breath.  Dispense: 8 g; Refill: 2  2. Pyelonephritis affecting pregnancy in second trimester Pt continues suppression with keflex, 500 mg BID No sign of current infection  3. Cocaine abuse affecting pregnancy in second trimester ([place]) Pt still currently using, advised to discontinue  Will check UDS, pt to be see by [facility] or [facility] next visit if possible  4. AMA (advanced maternal age) multigravida 35+, second trimester   5. Alcohol use affecting pregnancy in second trimester See above.  6. Anemia in pregnancy, second trimester Will review labs to see if anemia is persistent  7. History of seizure Will monitor closely, low threshold for neurology consult  8. History of IUFD Consider fetal testing at 28-32 weeks due to previous history  9. Asthma affecting pregnancy in second trimester Rx for albuterol inhaler today   Preterm labor symptoms and general obstetric precautions including but not limited to vaginal bleeding, contractions, leaking of fluid and fetal movement were reviewed in detail with the patient.  Please refer to After Visit Summary for other counseling recommendations.   Return in about 2 weeks (around 03/15/2022) for [facility], in person, schedule with [facility] .  Griffin Basil 03/01/2022 2:36 PM

## 2022-03-01 NOTE — Addendum Note (Signed)
Addended by: Bethanne Ginger on: 03/01/2022 04:58 PM   Modules accepted: Orders

## 2022-03-01 NOTE — Patient Instructions (Signed)
AREA PEDIATRIC/FAMILY PRACTICE PHYSICIANS  [name] ([address]) [facility] Chambliss, MD; Eniola, MD; Hale, MD; Hensel, MD; McDiarmid, MD; McIntyer, MD; Aron Inge, MD; Walden, MD [address] [place], [place] [address] [phone] Mon-Fri 8:30-12:30, 1:30-5:00 Providers come to see babies at [facility] Accepting Medicaid [facility] providers who accept newborns: Koirala, MD; Morrow, MD; Wolters, MD [address], [place], [place] [address] [phone] Mon-Fri 8:00-5:30 Babies seen by providers at [facility] Does NOT accept Medicaid Please call early in hospitalization for appointment (limited availability)  [facility] Mulberry, MD [address] [place], [place] [address] [phone] Mon, Tue, Thur, Fri 8:30-5:00, Wed 10:00-7:00 (closed 1-2pm) Babies seen by [facility] providers Accepting Medicaid Rubin - Pediatrician Rubin, MD [address], [place], [place] [address] [phone] Mon-Fri 8:30-5:00, Sat 8:30-12:00 Provider comes to see babies at [facility] Accepting Medicaid Must have been referred from current patients or contacted office prior to delivery [facility] ([facility]) Brown, MD; Chandler, MD; Ettefagh, MD; Grant, MD; Lester, MD; McCormick, MD; McQueen, MD; Prose, MD; Simha, MD; Stanley, MD; Stryffeler, NP; Tebben, NP [address], [place], [place] [address] [phone] Mon, Tue, Thur, Fri 8:30-5:30, Wed 9:30-5:30, Sat 8:30-12:30 Babies seen by [facility] providers Accepting Medicaid Only accepting infants of first-time parents or siblings of current patients Hospital discharge coordinator will make follow-up appointment Jack Amos [address], [place], [place]  [address] [phone]   Fax - [phone] [facility] [address], [place], [place]  [address] Phone - [phone]   Fax - [phone] Shilpa Gosrani [address], [place], [place]  [address] [phone]  [place] ([address]) [facility] Bates, MD; Brassfield, MD; Cooper, Cox, MD; MD; Davis, MD; Dovico, MD; Ettefaugh, MD; Little, MD; Lowe, MD; Keiffer, MD; Melvin, MD; Sumner, MD; Williams, MD [address], [place], [place] [address] [phone] Mon-Fri 8:30-5:00 (extended evenings Mon-Thur as needed), Sat-Sun 10:00-1:00 Providers come to see babies at [facility] Accepting Medicaid for families of first-time babies and families with all children in the household age 3 and under. Must register with office prior to making appointment (M-F only). [facility] Henson, NP; Knapp, MD; Lalonde, MD; Tysinger, PA [address] [place], [place] [address] [phone] Mon-Fri 8:00-5:00 Babies seen by providers at [facility] Does NOT accept Medicaid/Commercial Insurance Only [facility] ([facility])  Artis, MD; Barnes, MD; Bratton, MD; Coccaro, MD; Lockett Gardner, MD; Kramer, MD; Marshall, MD; Netherton, MD; Poleto, MD; Skinner, MD [address] [place], [place] [address] [phone] Mon-Fri 8:30-5:30, Sat (Oct.-Mar.) 9:00-1:00 Babies seen by providers at [facility] Accepting Medicaid  [place] ([address]) [facility] Reid, MD; Warner, MD [address], [place], [place] [address] [phone] Mon-Fri 8:30-5:00, Sat 8:30-12:00 Providers come to see babies at [facility] Does NOT accept Medicaid [facility] Becker, PA; Hagler, MD; Scifres, PA; Sun, MD; Swayne, MD [address], [place],  [address] [phone] Mon-Fri 8:00-5:00 Babies seen by providers at [facility] Does NOT accept Medicaid Only accepting babies of parents who  are patients Please call early in hospitalization for appointment (limited availability) [facility] Clark, MD; Frye, MD; Kelleher, MD; Mack, NP; Miller, MD; O'Keller, MD; Patterson, NP; Pudlo, MD; Puzio, MD; Thomas, MD; Tucker, MD; Twiselton, MD [address], [place], [place] [address] [phone] Mon-Fri 8:00-5:00, Sat 9:00-12:00 Providers come to see babies at [facility] Does NOT accept Medicaid  [place] ([address]) [facility] providers accepting new patients: Brake, NP; Wharton, PA [address], [place], [place] [address] [phone] Mon-Fri 8:00-5:00 Babies seen by providers at [facility] Does NOT accept Medicaid Only accepting babies of parents who are patients Please call early in hospitalization for appointment (limited availability) [facility] Gay, MD; Quinlan, MD [address] [place], [place] [address] [phone] (press [phone] to schedule appointment) Mon-Fri 8:00-5:00 Providers come to see babies at [facility] Does NOT accept Medicaid [facility] Mazer, MD [address] [place], [place] [address] [phone] Mon-Fri 8:30-5:00 (lunch 12:30-1:00), extended hours by appointment only Wed 5:00-6:30 Babies seen by [facility] providers Accepting Medicaid [facility] Banks, MD; Jordan, MD; Koberlein, MD [address], [place], [place] [address] [phone] Mon-Fri 8:00-5:00 Babies seen by [facility] providers Does NOT accept Medicaid [facility] Parker, MD; Hunter, MD; Wallace, DO [address] [place], [place] [address] [phone] Mon-Fri 8:00-5:00 Babies seen by [facility] providers Does NOT accept Medicaid [facility] Brandon, PA; Brecken, PA; Christy, NP; Dees, MD; DeClaire, MD; DeWeese, MD; Hansen, NP; Mills, NP; Parrish, NP; Smoot, NP; Summer, MD; Vapne,  MD [address] [place], [place] [address] [phone] Mon-Fri 8:30-5:00, Sat 10:00-1:00 Providers come to see babies at [facility] Does NOT accept Medicaid Free prenatal information session Tuesdays at 4:45pm [facility] Bouska, MD; Gordon, PA; Jeffery, PA; Weber, PA [address] [place] [address] [phone] Mon-Fri 7:30-5:30 Babies seen by [facility] providers [facility] [address], [place], [place]  [address] [phone]   Fax - [phone]  [place] ([address] & [address]) [facility] [place] [address] [place], [place] [address] [phone] Mon-Thur 8:00-6:00 Providers come to see babies at [facility] Accepting Medicaid [facility] Anderson, NP; Badger, MD; Beal, PA; Spencer, PA [address] [place], [place] [address] [phone] Mon-Thur 7:30-7:30, Fri 7:30-4:30 Babies seen by [facility] providers Accepting Medicaid [facility] Agbuya, MD; Klett, NP; Romgoolam, MD [address] [place] [address], [place], [place] [address] [phone] Mon-Fri 8:30-5:00, Sat 8:30-12:00 Providers come to see babies at [facility] Accepting Medicaid Must have "Meet & Greet" appointment at office prior to delivery [facility] McCord, MD; Wallace, MD; Wood, MD [address], [place], [place] [address] [phone] Mon-Wed 8:00-6:00, Thur-Fri 8:00-5:00, Sat 9:00-12:00 Providers come to see babies at [facility] Does NOT accept Medicaid Only accepting siblings of current patients [facility]   [address], , [place]  [address] [phone]   Fax - [phone] [facility] [address], , [place]  [address] [phone]   Fax -  [phone]  [place]  ([address] & [address]) [facility] Cirigliano, DO; Matthews, DO [address] , [place] [address] [phone] Mon-Fri 7:00-5:00 Babies seen by [facility] providers Does NOT accept Medicaid [facility] Briscoe, MD; Howley, PA; Moreira, PA [address], [place],  [address] [phone] Mon-Fri 8:00-5:00 Babies seen by [facility] providers Accepting Medicaid [facility] Boyd, MD; Church, PA; Jones, NP; Osborn, PA [address], ,  [address] [phone] Mon-Fri 8:00-5:00 Babies seen by providers at [facility] Accepting Medicaid  [place] ([address]) [facility] [place] Wendling, DO [address] [place], [place] [address] [phone] Mon-Fri 8:00-5:00 Babies seen by [facility] providers Does NOT accept Medicaid Limited availability, please call early in hospitalization to schedule follow-up [facility] Calderon, PA; Cummings, MD; Dillard, MD; Martin, PA; Olson, MD; VanDeven, PA [address], [place], [place] [address] [phone] Mon-Fri 8:30-5:00, Sat 9:00-12:00 Babies seen by providers at [facility] Accepting Medicaid Please register online then schedule online or call office [email or web address] Hunter, NP; Kumar, MD; Martin Rogers, PA [address], [place], [place] [address] [phone] Mon-Fri 8:00-5:00 Babies seen by providers at [facility] Accepting Medicaid [facility] ([facility]) Jagual, MD; Kristi Fleenor, NP; West, MD [address], [place], [place] [address] [phone] Mon-Fri 8:00-5:30, Sat&Sun by appointment (phones open at  8:30) Babies seen by [facility] providers Accepting Medicaid Must be a first-time baby or sibling of current patient [facility] [place]  [address], [place], [place]  [address] [phone]   Fax - [phone]  [place] ([address]) [facility] Brown, PA; Cowen, PA; Rice, MD; Helton, PA; Spry, MD [address] [place], [place] [address] [phone] Mon-Thur 8:00-7:00, Fri 8:00-5:00, Sat 8:00-12:00, Sun 9:00-12:00 Babies seen by [facility] providers Accepting Medicaid [facility] Coe-Goins, MD; Marshall, MD; Pierre-Louis, MD [address], [place], [place] [address] [phone] Mon-Thur 8:00-5:00 Babies seen by providers at [facility] Accepting Medicaid [facility] Bratton, MD; Coe-Goins, MD; Hayes, MD; Lewis, MD; List, MD; Lott, MD; Marshall, MD; Moran, MD; O'Novali Vollman, MD; Pierre-Louis, MD; Pitonzo, MD; Scholer, MD; Spangle, MD [address] [place], [place] [address] [phone] Mon-Fri 8:00-5:30, Sat (Oct.-Mar.) 9:00-1:00 Babies seen by providers at [facility] Accepting Medicaid Must fill out new patient packet, available online at [email or web address] [facility] ([facility]) Friddle, NP; Harris, NP; Kelly, NP; Logan, MD; Melvin, PA; Poth, MD; Ramadoss, MD; Stanton, NP [address], [place], [place] [address] [phone] Mon-Thur 8:00-5:30, Fri 8:00-5:00 Babies seen by providers at [facility] Accepting Medicaid  [place] ([address]) [facility] Dixon, PA; Yutan, MD; Pickard, MD; Tapia, PA [address], [place], [place] [address] [phone] Mon-Fri 8:00-5:00 Babies seen by providers at [facility] Accepting Medicaid   [place] ([address]) [facility] [place] Masneri, DO; Meyers, MD; Nelson, PA [address], [place], [place] [address] [phone] Mon-Fri 8:00-5:00 Babies seen by providers at [facility] Does NOT accept Medicaid Limited appointment availability, please call early in hospitalization  [facility] [place] Kunedd, DO; McGowen, MD [address], [place], [place] [address] [phone] Mon-Fri 8:00-5:00 Babies seen by [facility] providers Does NOT accept Medicaid [facility] [place] Cameron, MD; MacDonald, MD; Michaels, PA; Nayak, MD [address] [place] [address], [place], [place] [address] [phone] Mon-Fri 8:00-5:00 After hours clinic ([address] [place],  [address]) [phone] Mon-Fri 5:00-8:00, Sat 12:00-6:00, Sun 10:00-4:00 Babies seen by [facility] providers Accepting Medicaid [facility] [place] [address], [place], [place]  [address] [phone]   Fax - [phone]  [place] ([address]) [facility] [place] [facility] Andy, MD [address], [place], [place] [address] [phone] Mon-Fri 8:00-5:00 Babies seen by [facility] providers Does NOT accept Medicaid [facility] [place] ([facility] [place]) Eksir, MD [address], [place], [place] [address] [phone] Mon-Thur 8:00-7:00, Fri 8:00-5:00, Sat 8:00-12:00 Babies seen by providers at [facility] Accepting Medicaid - but does not have vaccinations in office (must be received elsewhere) Limited availability, please call early in hospitalization  [place] ([address]) [facility]  Charlene Flemming, MD [address], [place] [address] [phone]  Fax [phone]  [facility]  Kimberly Newton, MD, Annamarie Streilein, PA, Carla Hampton, PA [address] [place], [place]  [address] [phone] [name]  [address], [place], [place] [address] [phone] [address], [place], [place] [address] [phone] (West Office)  [facility] [address], [place], [place] [address] [phone] [facility] [address] [place], [place], [place] [address] [phone] [facility] [address], [place], [place] [address] [phone] [facility] [address], [place], [place] [address] [phone] [facility] [address], [place], [place] [address] [phone] [facility] [address], [place], [place] [address] [phone] [facility]  [address], [place], [place] [address] [phone] Dr[facility] [address] [place] [address], [place], [place] [address] [phone] [facility] [address], [place], [place] [address] [phone] [facility] [address], , [place] [address] [phone]  

## 2022-03-01 NOTE — Addendum Note (Signed)
Addended by: Bethanne Ginger on: 03/01/2022 05:03 PM   Modules accepted: Orders

## 2022-03-01 NOTE — Progress Notes (Signed)
Is patient a CenteringPregnancy candidate?  Not a Candidate Declined due to NA Not a candidate due to Uncontrolled SUD

## 2022-03-02 LAB — CBC/D/PLT+RPR+RH+ABO+RUBIGG...
Antibody Screen: NEGATIVE
Basophils Absolute: 0.1 10*3/uL (ref 0.0–0.2)
Basos: 1 %
EOS (ABSOLUTE): 0 10*3/uL (ref 0.0–0.4)
Eos: 0 %
HCV Ab: NONREACTIVE
HIV Screen 4th Generation wRfx: NONREACTIVE
Hematocrit: 38.9 % (ref 34.0–46.6)
Hemoglobin: 12.8 g/dL (ref 11.1–15.9)
Hepatitis B Surface Ag: NEGATIVE
Immature Grans (Abs): 0.1 10*3/uL (ref 0.0–0.1)
Immature Granulocytes: 1 %
Lymphocytes Absolute: 1.4 10*3/uL (ref 0.7–3.1)
Lymphs: 17 %
MCH: 27 pg (ref 26.6–33.0)
MCHC: 32.9 g/dL (ref 31.5–35.7)
MCV: 82 fL (ref 79–97)
Monocytes Absolute: 0.5 10*3/uL (ref 0.1–0.9)
Monocytes: 7 %
Neutrophils Absolute: 6.2 10*3/uL (ref 1.4–7.0)
Neutrophils: 74 %
Platelets: 321 10*3/uL (ref 150–450)
RBC: 4.74 x10E6/uL (ref 3.77–5.28)
RDW: 18.4 % — ABNORMAL HIGH (ref 11.7–15.4)
RPR Ser Ql: NONREACTIVE
Rh Factor: POSITIVE
Rubella Antibodies, IGG: 21.5 index (ref >0.99)
WBC: 8.2 10*3/uL (ref 3.4–10.8)

## 2022-03-02 LAB — HEMOGLOBIN A1C
Est. average glucose Bld gHb Est-mCnc: 108 mg/dL
Hgb A1c MFr Bld: 5.4 % (ref 4.8–5.6)

## 2022-03-02 LAB — GC/CHLAMYDIA PROBE AMP (~~LOC~~) NOT AT ARMC
Chlamydia: NEGATIVE
Comment: NEGATIVE
Comment: NORMAL
Neisseria Gonorrhea: NEGATIVE

## 2022-03-02 LAB — HCV INTERPRETATION

## 2022-03-02 MED ORDER — BLOOD PRESSURE MONITORING DEVI: 1.0000 | 0 refills | Status: DC

## 2022-03-02 NOTE — Addendum Note (Signed)
Addended by: Bethanne Ginger on: 03/02/2022 02:36 PM   Modules accepted: Orders

## 2022-03-03 LAB — URINE CULTURE, OB REFLEX

## 2022-03-03 LAB — CULTURE, OB URINE

## 2022-03-04 LAB — TOXASSURE SELECT 13 (MW), URINE

## 2022-03-06 LAB — CYTOLOGY - PAP
Comment: NEGATIVE
Diagnosis: NEGATIVE
High risk HPV: NEGATIVE

## 2022-03-08 LAB — PANORAMA PRENATAL TEST FULL PANEL:PANORAMA TEST PLUS 5 ADDITIONAL MICRODELETIONS: FETAL FRACTION: 10

## 2022-03-10 ENCOUNTER — Ambulatory Visit: Payer: Medicaid Other | Attending: Obstetrics and Gynecology

## 2022-03-10 ENCOUNTER — Other Ambulatory Visit: Payer: Medicaid Other

## 2022-03-10 DIAGNOSIS — O0990 Supervision of high risk pregnancy, unspecified, unspecified trimester: Secondary | ICD-10-CM

## 2022-03-12 LAB — HORIZON CUSTOM: REPORT SUMMARY: NEGATIVE

## 2022-03-16 ENCOUNTER — Inpatient Hospital Stay (HOSPITAL_COMMUNITY)
Admission: AD | Admit: 2022-03-16 | Discharge: 2022-03-16 | Disposition: A | Discharge status: Home / Self Care | Payer: Medicaid Other | Attending: Obstetrics & Gynecology | Admitting: Obstetrics & Gynecology

## 2022-03-16 ENCOUNTER — Encounter (HOSPITAL_COMMUNITY): Payer: Self-pay | Admitting: Obstetrics & Gynecology

## 2022-03-16 DIAGNOSIS — O09292 Supervision of pregnancy with other poor reproductive or obstetric history, second trimester: Secondary | ICD-10-CM | POA: Insufficient documentation

## 2022-03-16 DIAGNOSIS — O99322 Drug use complicating pregnancy, second trimester: Secondary | ICD-10-CM | POA: Insufficient documentation

## 2022-03-16 DIAGNOSIS — O99332 Smoking (tobacco) complicating pregnancy, second trimester: Secondary | ICD-10-CM | POA: Diagnosis not present

## 2022-03-16 DIAGNOSIS — O99512 Diseases of the respiratory system complicating pregnancy, second trimester: Secondary | ICD-10-CM | POA: Insufficient documentation

## 2022-03-16 DIAGNOSIS — Z3A21 21 weeks gestation of pregnancy: Secondary | ICD-10-CM | POA: Insufficient documentation

## 2022-03-16 DIAGNOSIS — R3589 Other polyuria: Secondary | ICD-10-CM | POA: Insufficient documentation

## 2022-03-16 DIAGNOSIS — O26892 Other specified pregnancy related conditions, second trimester: Secondary | ICD-10-CM | POA: Diagnosis present

## 2022-03-16 DIAGNOSIS — O2342 Unspecified infection of urinary tract in pregnancy, second trimester: Secondary | ICD-10-CM | POA: Insufficient documentation

## 2022-03-16 DIAGNOSIS — F1410 Cocaine abuse, uncomplicated: Secondary | ICD-10-CM

## 2022-03-16 LAB — URINALYSIS, ROUTINE W REFLEX MICROSCOPIC
Bilirubin Urine: NEGATIVE
Glucose, UA: NEGATIVE mg/dL
Hgb urine dipstick: NEGATIVE
Ketones, ur: NEGATIVE mg/dL
Nitrite: POSITIVE — AB
Protein, ur: 100 mg/dL — AB
Specific Gravity, Urine: 1.017 (ref 1.005–1.030)
WBC, UA: >50 WBC/hpf — ABNORMAL HIGH (ref 0–5)
pH: 5 (ref 5.0–8.0)

## 2022-03-16 MED ORDER — AMOXICILLIN-POT CLAVULANATE 875-125 MG PO TABS: 1.0000 | ORAL_TABLET | Freq: Two times a day (BID) | ORAL | 0 refills | Status: DC

## 2022-03-16 MED ORDER — AMOXICILLIN-POT CLAVULANATE 875-125 MG PO TABS
1.0000 | ORAL_TABLET | Freq: Once | ORAL | Status: AC
  Administered 2022-03-16: 1 via ORAL
  Filled 2022-03-16: qty 1

## 2022-03-16 MED ORDER — CEPHALEXIN 500 MG PO CAPS: 500.0000 mg | ORAL_CAPSULE | Freq: Every day | ORAL | 5 refills | Status: DC

## 2022-03-16 NOTE — MAU Note (Signed)
.  Kelly Huang is a 36 y.o. at [redacted]w[redacted]d here in MAU reporting: c/o sharp pain in lower abd. Has had thi pain most of the time gets worse with activity. Sitting up or trying to walk. Denies any vag bleeding or leaking. Feeling baby move regular. LMP:  Onset of complaint: today Pain score: 9 Vitals:   03/16/22 1708  BP: 117/65  Pulse: 76  Resp: 18  Temp: 98.2 F (36.8 C)     FHT:152 Lab orders placed from triage:   u/a

## 2022-03-16 NOTE — MAU Provider Note (Addendum)
History     CSN: [id number]  Arrival date and time: 03/16/22 1627   None     Chief Complaint  Patient presents with   Abdominal Pain   HPI Kelly Huang is a 36 y/o at [redacted]w[redacted]d w/ PMH of cocaine and ETOH use, pyelo in this pregnancy, and high-risk pregnancy who presents to MAU c/o suprapubic pn. States pn started 2 days ago and is intermittent and sharp in nature and worsens with activity and a full bladder. Endorses polyuria and urgency but denies hematuria and dysuria. Last BM today with no issues. Denies recent illness, fever, back pain, flank pain, cramping, ctx, VB or LOF. Last used cocaine yesterday.   OB History     Gravida  2   Para  1   Term      Preterm  1   AB      Living         SAB      IAB      Ectopic      Multiple      Live Births           Obstetric Comments  IUFD 7 months         Past Medical History:  Diagnosis Date   Asthma    Herniated lumbar intervertebral disc    Seizures ([place])     History reviewed. No pertinent surgical history.  History reviewed. No pertinent family history.  Social History   Tobacco Use   Smoking status: Every Day    Packs/day: 0.25    Types: Cigarettes   Smokeless tobacco: Never  Vaping Use   Vaping Use: Never used  Substance Use Topics   Alcohol use: Not Currently    Alcohol/week: 1.0 standard drink of alcohol    Types: 1 Shots of liquor per week   Drug use: Yes    Types: Cocaine    Comment: last used 03/13/22    Allergies:  Allergies  Allergen Reactions   Bactrim [Sulfamethoxazole-Trimethoprim] Other (See Comments)    Patient is not sure of reaction. She stated that she was having dizzy spells.    Medications Prior to Admission  Medication Sig Dispense Refill Last Dose   acetaminophen (TYLENOL) 500 MG tablet Take 1 tablet (500 mg total) by mouth every 6 (six) hours as needed. 30 tablet 0 Past Month   Prenatal Vit-Fe Fumarate-FA (PRENATAL MULTIVITAMIN) TABS tablet Take 1 tablet by  mouth daily at 12 noon. 90 tablet 4 03/15/2022   albuterol (VENTOLIN HFA) 108 (90 Base) MCG/ACT inhaler Inhale 2 puffs into the lungs every 6 (six) hours as needed for wheezing or shortness of breath. 8 g 2    Blood Pressure Monitoring DEVI 1 each by Does not apply route once a week. 1 each 0    cyclobenzaprine (FLEXERIL) 10 MG tablet Take 1 tablet (10 mg total) by mouth 2 (two) times daily as needed for muscle spasms. (Patient not taking: Reported on 03/01/2022) 20 tablet 0    hydrOXYzine (ATARAX/VISTARIL) 25 MG tablet Take 1 tablet (25 mg total) by mouth every 8 (eight) hours as needed for anxiety. 12 tablet 0    ibuprofen (ADVIL) 600 MG tablet Take 1 tablet (600 mg total) by mouth every 6 (six) hours as needed. (Patient not taking: Reported on 03/01/2022) 30 tablet 1    oxyCODONE (OXY IR/ROXICODONE) 5 MG immediate release tablet Take 1-2 tablets (5-10 mg total) by mouth every 6 (six) hours as needed for severe pain. 21 tablet  0     Review of Systems  Constitutional:  Negative for appetite change, fatigue and fever.  Cardiovascular:  Negative for chest pain, palpitations and leg swelling.  Gastrointestinal:  Positive for abdominal pain (suprapubic). Negative for abdominal distention, blood in stool, constipation, diarrhea, nausea and vomiting.  Genitourinary:  Positive for frequency and urgency. Negative for decreased urine volume, difficulty urinating, dyspareunia, dysuria, enuresis, flank pain, hematuria, pelvic pain, vaginal bleeding, vaginal discharge and vaginal pain.  Neurological:  Negative for dizziness and syncope.  All other systems reviewed and are negative.  Physical Exam   Blood pressure 117/65, pulse 76, temperature 98.2 F (36.8 C), resp. rate 18, height 5' (1.524 m), weight 49.9 kg, last menstrual period 10/26/2021.  Physical Exam Vitals and nursing note reviewed. Exam conducted with a chaperone present.  Constitutional:      Appearance: She is normal weight.   Cardiovascular:     Heart sounds: Normal heart sounds.  Pulmonary:     Effort: Pulmonary effort is normal.     Breath sounds: Normal breath sounds.  Abdominal:     General: Bowel sounds are normal.     Palpations: Abdomen is soft.     Tenderness: There is abdominal tenderness in the suprapubic area.  Neurological:     Mental Status: She is alert and oriented to person, place, and time.    Bedside US Documentation Pt informed that the ultrasound is considered a limited OB ultrasound and is not intended to be a complete ultrasound exam.  Patient also informed that the ultrasound is not being completed with the intent of assessing for fetal or placental anomalies or any pelvic abnormalities.  Explained that the purpose of today's ultrasound is to assess for   placental abnormalities .  Patient acknowledges the purpose of the exam and the limitations of the study.    My read: viable IUP with subjectively normal fluid and fetal cardiac activity with vigorous fetal movement observed. Posterior placenta, appears grossly normal.   Results for orders placed or performed during the hospital encounter of 03/16/22 (from the past 24 hour(s))  Urinalysis, Routine w reflex microscopic Urine, Clean Catch     Status: Abnormal   Collection Time: 03/16/22  5:16 PM  Result Value Ref Range   Color, Urine YELLOW YELLOW   APPearance TURBID (A) CLEAR   Specific Gravity, Urine 1.017 1.005 - 1.030   pH 5.0 5.0 - 8.0   Glucose, UA NEGATIVE NEGATIVE mg/dL   Hgb urine dipstick NEGATIVE NEGATIVE   Bilirubin Urine NEGATIVE NEGATIVE   Ketones, ur NEGATIVE NEGATIVE mg/dL   Protein, ur 882 (A) NEGATIVE mg/dL   Nitrite POSITIVE (A) NEGATIVE   Leukocytes,Ua MODERATE (A) NEGATIVE   RBC / HPF 6-10 0 - 5 RBC/hpf   WBC, UA >50 (H) 0 - 5 WBC/hpf   Bacteria, UA MANY (A) NONE SEEN   Squamous Epithelial / LPF 6-10 0 - 5   Mucus PRESENT    No results found.   MAU Course  Procedures  MDM Lab results received and  reviewed. US showed active baby and grossly intact placenta. UA revealed nitrites and leukocytes. Suspect uncomplicated UTI. Pt will start abx for UTI. F/u as scheduled at [facility] on 03/21/22. Pt stable for discharge home. Return precautions D/W pt. She expressed her understanding and agreed with the plan.  Assessment and Plan  [redacted]w[redacted]d gestation pregnancy UTI - Start abx, first dose given in MAU.  Dierdre Searles, PA-S 03/16/2022, 6:16 PM    ATTENDING  ATTESTATION  I have seen and examined this patient and agree with the above documentation in the PA student's note except as below.  I have edited the above note for accuracy and clarity  Clarnce Flock, MD/[facility] [employer] [facility] (Faculty Practice) 03/16/2022, 7:03 PM

## 2022-03-18 LAB — CULTURE, OB URINE: Culture: >=100000 — AB

## 2022-03-21 ENCOUNTER — Encounter: Payer: Self-pay | Admitting: Family Medicine

## 2022-03-21 ENCOUNTER — Ambulatory Visit (INDEPENDENT_AMBULATORY_CARE_PROVIDER_SITE_OTHER): Payer: Medicaid Other | Admitting: Family Medicine

## 2022-03-21 VITALS — BP 124/76 | HR 86 | Wt 115.7 lb

## 2022-03-21 DIAGNOSIS — Z8759 Personal history of other complications of pregnancy, childbirth and the puerperium: Secondary | ICD-10-CM

## 2022-03-21 DIAGNOSIS — Z3A22 22 weeks gestation of pregnancy: Secondary | ICD-10-CM

## 2022-03-21 DIAGNOSIS — O2302 Infections of kidney in pregnancy, second trimester: Secondary | ICD-10-CM

## 2022-03-21 DIAGNOSIS — O09522 Supervision of elderly multigravida, second trimester: Secondary | ICD-10-CM

## 2022-03-21 DIAGNOSIS — F1490 Cocaine use, unspecified, uncomplicated: Secondary | ICD-10-CM

## 2022-03-21 DIAGNOSIS — O99322 Drug use complicating pregnancy, second trimester: Secondary | ICD-10-CM

## 2022-03-21 DIAGNOSIS — O0990 Supervision of high risk pregnancy, unspecified, unspecified trimester: Secondary | ICD-10-CM

## 2022-03-21 DIAGNOSIS — Z87898 Personal history of other specified conditions: Secondary | ICD-10-CM

## 2022-03-21 DIAGNOSIS — O0992 Supervision of high risk pregnancy, unspecified, second trimester: Secondary | ICD-10-CM

## 2022-03-21 MED ORDER — ASPIRIN 81 MG PO TBEC: 81.0000 mg | DELAYED_RELEASE_TABLET | Freq: Every day | ORAL | 12 refills | Status: DC

## 2022-03-21 NOTE — Progress Notes (Unsigned)
   Subjective:  Kelly Huang is a 36 y.o. G2P0100 at [redacted]w[redacted]d being seen today for ongoing prenatal care.  She is currently monitored for the following issues for this high-risk pregnancy and has Pyelonephritis affecting pregnancy in second trimester; Cocaine abuse affecting pregnancy in second trimester ([place]); No prenatal care in current pregnancy in second trimester; UTI in pregnancy, antepartum, first trimester; AMA (advanced maternal age) multigravida 35+, second trimester; Cocaine use complicating pregnancy; Alcohol use complicating pregnancy; Anemia in pregnancy, second trimester; History of seizure; History of IUFD; Supervision of high risk pregnancy, antepartum; Asthma affecting pregnancy in second trimester; and Hemorrhoids in pregnancy on their problem list.  Patient reports  R flank pain .  Contractions: Not present. Vag. Bleeding: None.  Movement: Present. Denies leaking of fluid.   The following portions of the patient's history were reviewed and updated as appropriate: allergies, current medications, past family history, past medical history, past social history, past surgical history and problem list. Problem list updated.  Objective:   Vitals:   03/21/22 1151  BP: 124/76  Pulse: 86  Weight: 115 lb 11.2 oz (52.5 kg)    Fetal Status: Fetal Heart Rate (bpm): 141   Movement: Present     General:  Alert, oriented and cooperative. Patient is in no acute distress.  Skin: Skin is warm and dry. No rash noted.   Cardiovascular: Normal heart rate noted  Respiratory: Normal respiratory effort, no problems with respiration noted  Abdomen: Soft, gravid, appropriate for gestational age. Pain/Pressure: Present     Pelvic: Vag. Bleeding: None     Cervical exam deferred        Extremities: Normal range of motion.  Edema: None  Mental Status: Normal mood and affect. Normal behavior. Normal judgment and thought content.   Urinalysis:      Assessment and Plan:  Pregnancy: G2P0100 at  [redacted]w[redacted]d  1. Supervision of high risk pregnancy, antepartum BP and FHR normal Anatomy scan not yet scheduled, will coordinate w MFM  2. History of IUFD Antenatal testing per guidelines  3. History of seizure Last one two years prior ctm  4. Cocaine use complicating pregnancy Reports last use was 3 days ago, unfortunately no good pharmacological therapy exists for this Discussed risks of use to pregnancy, especially placental abruption/fetal demise Encouraged cessation Also encouraged going to NA meeting, she is not interested at this time  5. AMA (advanced maternal age) multigravida 88+, second trimester Start baby ASA  6. Pyelonephritis affecting pregnancy in second trimester Reports some newish R flank pain, worried it might be pyelo coming back She was seen in MAU on 03/16/2022 by myself, gave her Augmentin at that time Reports she is taking augmentin as prescribed. Susceptibilities returned reassuring from that MAU visit Instructed to resume keflex prophylaxis after she finishes augmentin  Preterm labor symptoms and general obstetric precautions including but not limited to vaginal bleeding, contractions, leaking of fluid and fetal movement were reviewed in detail with the patient. Please refer to After Visit Summary for other counseling recommendations.  Return in 4 weeks (on 04/18/2022) for [facility], ob visit, needs MD.   Clarnce Flock, MD

## 2022-03-21 NOTE — Patient Instructions (Signed)

## 2022-03-26 ENCOUNTER — Other Ambulatory Visit: Payer: Self-pay

## 2022-03-26 ENCOUNTER — Encounter (HOSPITAL_COMMUNITY): Payer: Self-pay | Admitting: Family Medicine

## 2022-03-26 ENCOUNTER — Inpatient Hospital Stay (HOSPITAL_COMMUNITY)
Admission: AD | Admit: 2022-03-26 | Discharge: 2022-03-28 | DRG: 805 | Disposition: A | Discharge status: Home / Self Care | Payer: Medicaid Other | Attending: Obstetrics & Gynecology | Admitting: Obstetrics & Gynecology

## 2022-03-26 ENCOUNTER — Inpatient Hospital Stay (HOSPITAL_BASED_OUTPATIENT_CLINIC_OR_DEPARTMENT_OTHER): Payer: Medicaid Other

## 2022-03-26 DIAGNOSIS — O99314 Alcohol use complicating childbirth: Secondary | ICD-10-CM

## 2022-03-26 DIAGNOSIS — F1490 Cocaine use, unspecified, uncomplicated: Secondary | ICD-10-CM | POA: Diagnosis not present

## 2022-03-26 DIAGNOSIS — Z23 Encounter for immunization: Secondary | ICD-10-CM | POA: Diagnosis not present

## 2022-03-26 DIAGNOSIS — O42912 Preterm premature rupture of membranes, unspecified as to length of time between rupture and onset of labor, second trimester: Secondary | ICD-10-CM | POA: Diagnosis present

## 2022-03-26 DIAGNOSIS — O9902 Anemia complicating childbirth: Secondary | ICD-10-CM | POA: Diagnosis present

## 2022-03-26 DIAGNOSIS — O09522 Supervision of elderly multigravida, second trimester: Secondary | ICD-10-CM | POA: Diagnosis not present

## 2022-03-26 DIAGNOSIS — J45909 Unspecified asthma, uncomplicated: Secondary | ICD-10-CM | POA: Diagnosis present

## 2022-03-26 DIAGNOSIS — O4592 Premature separation of placenta, unspecified, second trimester: Secondary | ICD-10-CM | POA: Diagnosis present

## 2022-03-26 DIAGNOSIS — O99322 Drug use complicating pregnancy, second trimester: Secondary | ICD-10-CM

## 2022-03-26 DIAGNOSIS — F1410 Cocaine abuse, uncomplicated: Secondary | ICD-10-CM | POA: Diagnosis present

## 2022-03-26 DIAGNOSIS — O4590 Premature separation of placenta, unspecified, unspecified trimester: Secondary | ICD-10-CM | POA: Diagnosis present

## 2022-03-26 DIAGNOSIS — O0932 Supervision of pregnancy with insufficient antenatal care, second trimester: Secondary | ICD-10-CM

## 2022-03-26 DIAGNOSIS — O4593 Premature separation of placenta, unspecified, third trimester: Secondary | ICD-10-CM

## 2022-03-26 DIAGNOSIS — Z7982 Long term (current) use of aspirin: Secondary | ICD-10-CM | POA: Diagnosis not present

## 2022-03-26 DIAGNOSIS — O9952 Diseases of the respiratory system complicating childbirth: Secondary | ICD-10-CM | POA: Diagnosis present

## 2022-03-26 DIAGNOSIS — F17210 Nicotine dependence, cigarettes, uncomplicated: Secondary | ICD-10-CM | POA: Diagnosis present

## 2022-03-26 DIAGNOSIS — O09292 Supervision of pregnancy with other poor reproductive or obstetric history, second trimester: Secondary | ICD-10-CM

## 2022-03-26 DIAGNOSIS — O35EXX0 Maternal care for other (suspected) fetal abnormality and damage, fetal genitourinary anomalies, not applicable or unspecified: Secondary | ICD-10-CM | POA: Diagnosis not present

## 2022-03-26 DIAGNOSIS — O99334 Smoking (tobacco) complicating childbirth: Secondary | ICD-10-CM | POA: Diagnosis present

## 2022-03-26 DIAGNOSIS — O09523 Supervision of elderly multigravida, third trimester: Secondary | ICD-10-CM

## 2022-03-26 DIAGNOSIS — Z8759 Personal history of other complications of pregnancy, childbirth and the puerperium: Secondary | ICD-10-CM

## 2022-03-26 DIAGNOSIS — Z3A23 23 weeks gestation of pregnancy: Secondary | ICD-10-CM | POA: Diagnosis not present

## 2022-03-26 DIAGNOSIS — O9882 Other maternal infectious and parasitic diseases complicating childbirth: Secondary | ICD-10-CM

## 2022-03-26 DIAGNOSIS — O0990 Supervision of high risk pregnancy, unspecified, unspecified trimester: Secondary | ICD-10-CM

## 2022-03-26 DIAGNOSIS — F1090 Alcohol use, unspecified, uncomplicated: Secondary | ICD-10-CM

## 2022-03-26 DIAGNOSIS — O99324 Drug use complicating childbirth: Secondary | ICD-10-CM | POA: Diagnosis present

## 2022-03-26 DIAGNOSIS — O09293 Supervision of pregnancy with other poor reproductive or obstetric history, third trimester: Secondary | ICD-10-CM

## 2022-03-26 DIAGNOSIS — O42013 Preterm premature rupture of membranes, onset of labor within 24 hours of rupture, third trimester: Secondary | ICD-10-CM

## 2022-03-26 LAB — COMPREHENSIVE METABOLIC PANEL
ALT: 10 U/L (ref 0–44)
AST: 13 U/L — ABNORMAL LOW (ref 15–41)
Albumin: 2.7 g/dL — ABNORMAL LOW (ref 3.5–5.0)
Alkaline Phosphatase: 69 U/L (ref 38–126)
Anion gap: 8 (ref 5–15)
BUN: 6 mg/dL (ref 6–20)
CO2: 24 mmol/L (ref 22–32)
Calcium: 8.3 mg/dL — ABNORMAL LOW (ref 8.9–10.3)
Chloride: 101 mmol/L (ref 98–111)
Creatinine, Ser: 0.63 mg/dL (ref 0.44–1.00)
GFR, Estimated: >60 mL/min (ref >60)
Glucose, Bld: 91 mg/dL (ref 70–99)
Potassium: 3.5 mmol/L (ref 3.5–5.1)
Sodium: 133 mmol/L — ABNORMAL LOW (ref 135–145)
Total Bilirubin: 0.4 mg/dL (ref 0.3–1.2)
Total Protein: 6.2 g/dL — ABNORMAL LOW (ref 6.5–8.1)

## 2022-03-26 LAB — RAPID URINE DRUG SCREEN, HOSP PERFORMED
Amphetamines: NOT DETECTED
Barbiturates: NOT DETECTED
Benzodiazepines: NOT DETECTED
Cocaine: POSITIVE — AB
Opiates: NOT DETECTED
Tetrahydrocannabinol: NOT DETECTED

## 2022-03-26 LAB — CBC
HCT: 38 % (ref 36.0–46.0)
Hemoglobin: 12.4 g/dL (ref 12.0–15.0)
MCH: 27.5 pg (ref 26.0–34.0)
MCHC: 32.6 g/dL (ref 30.0–36.0)
MCV: 84.3 fL (ref 80.0–100.0)
Platelets: 256 10*3/uL (ref 150–400)
RBC: 4.51 MIL/uL (ref 3.87–5.11)
RDW: 17.9 % — ABNORMAL HIGH (ref 11.5–15.5)
WBC: 12.6 10*3/uL — ABNORMAL HIGH (ref 4.0–10.5)
nRBC: 0 % (ref 0.0–0.2)

## 2022-03-26 LAB — URINALYSIS, MICROSCOPIC (REFLEX)
RBC / HPF: >50 RBC/hpf (ref 0–5)
WBC, UA: >50 WBC/hpf (ref 0–5)

## 2022-03-26 LAB — FETAL FIBRONECTIN: Fetal Fibronectin: NEGATIVE

## 2022-03-26 LAB — URINALYSIS, ROUTINE W REFLEX MICROSCOPIC
Bilirubin Urine: NEGATIVE
Glucose, UA: NEGATIVE mg/dL
Ketones, ur: >80 mg/dL — AB
Nitrite: NEGATIVE
Protein, ur: NEGATIVE mg/dL
Specific Gravity, Urine: 1.01 (ref 1.005–1.030)
pH: 6.5 (ref 5.0–8.0)

## 2022-03-26 LAB — TYPE AND SCREEN
ABO/RH(D): A POS
Antibody Screen: NEGATIVE

## 2022-03-26 MED ORDER — IBUPROFEN 600 MG PO TABS
600.0000 mg | ORAL_TABLET | Freq: Four times a day (QID) | ORAL | Status: DC
  Administered 2022-03-26 – 2022-03-28 (×6): 600 mg via ORAL
  Filled 2022-03-26 (×7): qty 1

## 2022-03-26 MED ORDER — OXYTOCIN-SODIUM CHLORIDE 30-0.9 UT/500ML-% IV SOLN
2.5000 [IU]/h | INTRAVENOUS | Status: DC
  Administered 2022-03-26: 2.5 [IU]/h via INTRAVENOUS
  Filled 2022-03-26: qty 500

## 2022-03-26 MED ORDER — MAGNESIUM SULFATE BOLUS VIA INFUSION
4.0000 g | Freq: Once | INTRAVENOUS | Status: AC
  Administered 2022-03-26: 4 g via INTRAVENOUS
  Filled 2022-03-26: qty 1000

## 2022-03-26 MED ORDER — LACTATED RINGERS IV SOLN: INTRAVENOUS | Status: DC

## 2022-03-26 MED ORDER — TETANUS-DIPHTH-ACELL PERTUSSIS 5-2.5-18.5 LF-MCG/0.5 IM SUSY
0.5000 mL | PREFILLED_SYRINGE | Freq: Once | INTRAMUSCULAR | Status: AC
  Administered 2022-03-28: 0.5 mL via INTRAMUSCULAR
  Filled 2022-03-26: qty 0.5

## 2022-03-26 MED ORDER — LIDOCAINE HCL (PF) 1 % IJ SOLN: 30.0000 mL | INTRAMUSCULAR | Status: DC | PRN

## 2022-03-26 MED ORDER — BETAMETHASONE SOD PHOS & ACET 6 (3-3) MG/ML IJ SUSP
12.0000 mg | INTRAMUSCULAR | Status: DC
  Administered 2022-03-26: 12 mg via INTRAMUSCULAR
  Filled 2022-03-26: qty 5

## 2022-03-26 MED ORDER — SIMETHICONE 80 MG PO CHEW
80.0000 mg | CHEWABLE_TABLET | ORAL | Status: DC | PRN
  Administered 2022-03-28: 80 mg via ORAL
  Filled 2022-03-26: qty 1

## 2022-03-26 MED ORDER — SOD CITRATE-CITRIC ACID 500-334 MG/5ML PO SOLN: 30.0000 mL | ORAL | Status: DC | PRN

## 2022-03-26 MED ORDER — ONDANSETRON HCL 4 MG/2ML IJ SOLN: 4.0000 mg | Freq: Four times a day (QID) | INTRAMUSCULAR | Status: DC | PRN

## 2022-03-26 MED ORDER — OXYCODONE HCL 5 MG PO TABS
10.0000 mg | ORAL_TABLET | ORAL | Status: DC | PRN
  Administered 2022-03-26: 10 mg via ORAL
  Filled 2022-03-26: qty 2

## 2022-03-26 MED ORDER — MAGNESIUM SULFATE BOLUS VIA INFUSION: 6.0000 g | Freq: Once | INTRAVENOUS | Status: DC

## 2022-03-26 MED ORDER — LACTATED RINGERS IV BOLUS
1000.0000 mL | Freq: Once | INTRAVENOUS | Status: AC
  Administered 2022-03-26: 1000 mL via INTRAVENOUS

## 2022-03-26 MED ORDER — SENNOSIDES-DOCUSATE SODIUM 8.6-50 MG PO TABS
2.0000 | ORAL_TABLET | Freq: Every day | ORAL | Status: DC
  Administered 2022-03-27 – 2022-03-28 (×2): 2 via ORAL
  Filled 2022-03-26 (×2): qty 2

## 2022-03-26 MED ORDER — OXYTOCIN BOLUS FROM INFUSION
333.0000 mL | Freq: Once | INTRAVENOUS | Status: AC
  Administered 2022-03-26: 333 mL via INTRAVENOUS

## 2022-03-26 MED ORDER — COCONUT OIL OIL: 1.0000 | TOPICAL_OIL | Status: DC | PRN

## 2022-03-26 MED ORDER — DIPHENHYDRAMINE HCL 25 MG PO CAPS: 25.0000 mg | ORAL_CAPSULE | Freq: Four times a day (QID) | ORAL | Status: DC | PRN

## 2022-03-26 MED ORDER — MAGNESIUM SULFATE 40 GM/1000ML IV SOLN
INTRAVENOUS | Status: AC
  Administered 2022-03-26: 2 g/h via INTRAVENOUS
  Filled 2022-03-26: qty 1000

## 2022-03-26 MED ORDER — ONDANSETRON HCL 4 MG PO TABS: 4.0000 mg | ORAL_TABLET | ORAL | Status: DC | PRN

## 2022-03-26 MED ORDER — ZOLPIDEM TARTRATE 5 MG PO TABS: 5.0000 mg | ORAL_TABLET | Freq: Every evening | ORAL | Status: DC | PRN

## 2022-03-26 MED ORDER — LACTATED RINGERS IV SOLN: 500.0000 mL | INTRAVENOUS | Status: DC | PRN

## 2022-03-26 MED ORDER — BENZOCAINE-MENTHOL 20-0.5 % EX AERO: 1.0000 | INHALATION_SPRAY | CUTANEOUS | Status: DC | PRN

## 2022-03-26 MED ORDER — ACETAMINOPHEN 325 MG PO TABS
650.0000 mg | ORAL_TABLET | ORAL | Status: DC | PRN
  Administered 2022-03-26 – 2022-03-28 (×2): 650 mg via ORAL
  Filled 2022-03-26 (×2): qty 2

## 2022-03-26 MED ORDER — FENTANYL CITRATE (PF) 100 MCG/2ML IJ SOLN
100.0000 ug | Freq: Once | INTRAMUSCULAR | Status: AC
  Administered 2022-03-26: 100 ug via INTRAVENOUS
  Filled 2022-03-26: qty 2

## 2022-03-26 MED ORDER — WITCH HAZEL-GLYCERIN EX PADS: 1.0000 | MEDICATED_PAD | CUTANEOUS | Status: DC | PRN

## 2022-03-26 MED ORDER — DIBUCAINE (PERIANAL) 1 % EX OINT: 1.0000 | TOPICAL_OINTMENT | CUTANEOUS | Status: DC | PRN

## 2022-03-26 MED ORDER — OXYCODONE HCL 5 MG PO TABS
5.0000 mg | ORAL_TABLET | ORAL | Status: DC | PRN
  Administered 2022-03-26 – 2022-03-28 (×2): 5 mg via ORAL
  Filled 2022-03-26 (×2): qty 1

## 2022-03-26 MED ORDER — MAGNESIUM SULFATE 40 GM/1000ML IV SOLN: 2.0000 g/h | INTRAVENOUS | Status: DC

## 2022-03-26 MED ORDER — SODIUM CHLORIDE 0.9 % IV BOLUS: 1000.0000 mL | Freq: Once | INTRAVENOUS | Status: DC

## 2022-03-26 MED ORDER — ONDANSETRON HCL 4 MG/2ML IJ SOLN: 4.0000 mg | INTRAMUSCULAR | Status: DC | PRN

## 2022-03-26 MED ORDER — PRENATAL MULTIVITAMIN CH
1.0000 | ORAL_TABLET | Freq: Every day | ORAL | Status: DC
  Administered 2022-03-27: 1 via ORAL
  Filled 2022-03-26: qty 1

## 2022-03-26 NOTE — H&P (Signed)
OBSTETRIC ADMISSION HISTORY AND PHYSICAL  Kelly Huang is a 36 y.o. female G2P0100 with IUP at [redacted]w[redacted]d by 12 wk [place] presenting via EMS for PTL. Seen in MAU for contractions starting at 7 am this morning coming every 5 minutes. She reports +FMs, No LOF, light VB, no blurry vision, no headaches or peripheral edema, and no RUQ pain.  She plans on bottle feeding. She request depo for birth control. She received [facility] at  [facility]    Dating: By 12 wk [place] --->  Estimated Date of Delivery: 07/21/22  Sono:    [place] pending today  @[redacted]w[redacted]d , CWD, normal anatomy, cephalic presentation, posterior placental lie   Prenatal History/Complications:  -Limited PNC -Hx of IUFD -Cocaine and alcohol abuse affecting pregnancy -Hx of pyelo -Anemia -Hx of seixures -AMA -UTD, referred to MFM  Past Medical History: Past Medical History:  Diagnosis Date   Asthma    Herniated lumbar intervertebral disc    Seizures (HCC)     Past Surgical History: History reviewed. No pertinent surgical history.  Obstetrical History: OB History     Gravida  2   Para  1   Term      Preterm  1   AB      Living         SAB      IAB      Ectopic      Multiple      Live Births           Obstetric Comments  IUFD 7 months         Social History Social History   Socioeconomic History   Marital status: Single    Spouse name: Not on file   Number of children: Not on file   Years of education: Not on file   Highest education level: Not on file  Occupational History   Not on file  Tobacco Use   Smoking status: Every Day    Packs/day: 0.25    Types: Cigarettes   Smokeless tobacco: Never  Vaping Use   Vaping Use: Never used  Substance and Sexual Activity   Alcohol use: Not Currently    Alcohol/week: 1.0 standard drink of alcohol    Types: 1 Shots of liquor per week   Drug use: Yes    Types: Cocaine    Comment: last use 03/22/22   Sexual activity: Yes    Birth control/protection:  None, Condom  Other Topics Concern   Not on file  Social History Narrative   Not on file   Social Determinants of Health   Financial Resource Strain: Not on file  Food Insecurity: Food Insecurity Present (02/21/2022)   Hunger Vital Sign    Worried About Running Out of Food in the Last Year: Sometimes true    Ran Out of Food in the Last Year: Sometimes true  Transportation Needs: Unmet Transportation Needs (02/21/2022)   PRAPARE - 02/23/2022 (Medical): Yes    Lack of Transportation (Non-Medical): Yes  Physical Activity: Not on file  Stress: Not on file  Social Connections: Not on file    Family History: History reviewed. No pertinent family history.  Allergies: Allergies  Allergen Reactions   Bactrim [Sulfamethoxazole-Trimethoprim] Other (See Comments)    Patient is not sure of reaction. She stated that she was having dizzy spells.    Medications Prior to Admission  Medication Sig Dispense Refill Last Dose   amoxicillin-clavulanate (AUGMENTIN) 875-125 MG tablet  Take 1 tablet by mouth every 12 (twelve) hours. 9 tablet 0 03/25/2022   cephALEXin (KEFLEX) 500 MG capsule Take 1 capsule (500 mg total) by mouth at bedtime. 30 capsule 5 03/25/2022   Prenatal Vit-Fe Fumarate-FA (PRENATAL MULTIVITAMIN) TABS tablet Take 1 tablet by mouth daily at 12 noon. 90 tablet 4 03/25/2022   acetaminophen (TYLENOL) 500 MG tablet Take 1 tablet (500 mg total) by mouth every 6 (six) hours as needed. 30 tablet 0 More than a month   albuterol (VENTOLIN HFA) 108 (90 Base) MCG/ACT inhaler Inhale 2 puffs into the lungs every 6 (six) hours as needed for wheezing or shortness of breath. 8 g 2 More than a month   aspirin EC 81 MG tablet Take 1 tablet (81 mg total) by mouth daily. Swallow whole. 30 tablet 12 Unknown   Blood Pressure Monitoring DEVI 1 each by Does not apply route once a week. 1 each 0 Unknown   cyclobenzaprine (FLEXERIL) 10 MG tablet Take 1 tablet (10 mg total) by  mouth 2 (two) times daily as needed for muscle spasms. (Patient not taking: Reported on 03/01/2022) 20 tablet 0 Unknown   hydrOXYzine (ATARAX/VISTARIL) 25 MG tablet Take 1 tablet (25 mg total) by mouth every 8 (eight) hours as needed for anxiety. 12 tablet 0 Unknown     Review of Systems   All systems reviewed and negative except as stated in HPI  Blood pressure 104/64, pulse 95, temperature 97.9 F (36.6 C), temperature source Oral, resp. rate 18, last menstrual period 10/26/2021, SpO2 99 %. General appearance: alert, moderate distress, and with contractions Lungs: clear to auscultation bilaterally Heart: regular rate and rhythm Abdomen: soft, gravid. No acute abdomen Pelvic: Deferred Extremities: Homans sign is negative, no sign of DVT Presentation: cephalic per bedside ultrasound tech Fetal monitoringBaseline: 145 bpm, Variability: Good {> 6 bpm), Accelerations: Reactive, and Decelerations: Late, recurrent with every contraction Uterine activityFrequency: Every 2-5 minutes     Prenatal labs: ABO, Rh: A/Positive/-- (09/27 1445) Antibody: Negative (09/27 1445) Rubella: 21.50 (09/27 1445) RPR: Non Reactive (09/27 1445)  HBsAg: Negative (09/27 1445)  HIV: Non Reactive (09/27 1445)  GBS:   n/a 1 hr Glucola n/a Genetic screening  n/a Anatomy US n/a  Prenatal Transfer Tool  Maternal Diabetes: n/a Genetic Screening: n/a Maternal Ultrasounds/Referrals: Other: UTDA1 Fetal Ultrasounds or other Referrals:  Referred to Materal Fetal Medicine  Maternal Substance Abuse:  Yes:  Type: Cocaine, Other:  Alcohol Significant Maternal Medications:  None Significant Maternal Lab Results:  None Number of Prenatal Visits:Less than or equal to 3 verified prenatal visits Other Comments:  None  Results for orders placed or performed during the hospital encounter of 03/26/22 (from the past 24 hour(s))  Rapid urine drug screen (hospital performed)   Collection Time: 03/26/22 11:08 AM  Result  Value Ref Range   Opiates NONE DETECTED NONE DETECTED   Cocaine POSITIVE (A) NONE DETECTED   Benzodiazepines NONE DETECTED NONE DETECTED   Amphetamines NONE DETECTED NONE DETECTED   Tetrahydrocannabinol NONE DETECTED NONE DETECTED   Barbiturates NONE DETECTED NONE DETECTED  Urinalysis, Routine w reflex microscopic Urine, Clean Catch   Collection Time: 03/26/22 11:08 AM  Result Value Ref Range   Color, Urine YELLOW YELLOW   APPearance CLEAR CLEAR   Specific Gravity, Urine 1.010 1.005 - 1.030   pH 6.5 5.0 - 8.0   Glucose, UA NEGATIVE NEGATIVE mg/dL   Hgb urine dipstick MODERATE (A) NEGATIVE   Bilirubin Urine NEGATIVE NEGATIVE   Ketones, ur >80 (  A) NEGATIVE mg/dL   Protein, ur NEGATIVE NEGATIVE mg/dL   Nitrite NEGATIVE NEGATIVE   Leukocytes,Ua LARGE (A) NEGATIVE  Urinalysis, Microscopic (reflex)   Collection Time: 03/26/22 11:08 AM  Result Value Ref Range   RBC / HPF >50 0 - 5 RBC/hpf   WBC, UA >50 0 - 5 WBC/hpf   Bacteria, UA MANY (A) NONE SEEN   Squamous Epithelial / LPF 11-20 0 - 5   Mucus PRESENT   CBC   Collection Time: 03/26/22 11:14 AM  Result Value Ref Range   WBC 12.6 (H) 4.0 - 10.5 K/uL   RBC 4.51 3.87 - 5.11 MIL/uL   Hemoglobin 12.4 12.0 - 15.0 g/dL   HCT 38.0 36.0 - 46.0 %   MCV 84.3 80.0 - 100.0 fL   MCH 27.5 26.0 - 34.0 pg   MCHC 32.6 30.0 - 36.0 g/dL   RDW 17.9 (H) 11.5 - 15.5 %   Platelets 256 150 - 400 K/uL   nRBC 0.0 0.0 - 0.2 %  Comprehensive metabolic panel   Collection Time: 03/26/22 11:14 AM  Result Value Ref Range   Sodium 133 (L) 135 - 145 mmol/L   Potassium 3.5 3.5 - 5.1 mmol/L   Chloride 101 98 - 111 mmol/L   CO2 24 22 - 32 mmol/L   Glucose, Bld 91 70 - 99 mg/dL   BUN 6 6 - 20 mg/dL   Creatinine, Ser 0.63 0.44 - 1.00 mg/dL   Calcium 8.3 (L) 8.9 - 10.3 mg/dL   Total Protein 6.2 (L) 6.5 - 8.1 g/dL   Albumin 2.7 (L) 3.5 - 5.0 g/dL   AST 13 (L) 15 - 41 U/L   ALT 10 0 - 44 U/L   Alkaline Phosphatase 69 38 - 126 U/L   Total Bilirubin 0.4 0.3  - 1.2 mg/dL   GFR, Estimated >60 >60 mL/min   Anion gap 8 5 - 15    Patient Active Problem List   Diagnosis Date Noted   Preterm labor 03/26/2022   Supervision of high risk pregnancy, antepartum 03/01/2022   Asthma affecting pregnancy in second trimester 03/01/2022   Hemorrhoids in pregnancy 03/01/2022   Anemia in pregnancy, second trimester 02/19/2022   History of seizure 02/19/2022   History of IUFD 02/19/2022   Pyelonephritis affecting pregnancy in second trimester 02/18/2022   Cocaine abuse affecting pregnancy in second trimester ([place]) 02/18/2022   No prenatal care in current pregnancy in second trimester 02/18/2022   UTI in pregnancy, antepartum, first trimester 02/18/2022   AMA (advanced maternal age) multigravida 35+, second trimester 02/18/2022   Cocaine use complicating pregnancy 55/73/2202   Alcohol use complicating pregnancy 54/27/0623    Assessment/Plan:  Kelly Huang is a 36 y.o. G2P0100 at [redacted]w[redacted]d here for PTL.   Consulted Dr. Elly Modena who came to evaluate patient at bedside and fetal presenting parts on cervical exam. Admit to L&D.   NICU consulted Mag started BMZ X1 IVF bolus x2  UDS- +cocaine use (last reported by patient 4 days ago) FFN collected in MAU  #Labor: Expectant managment #Pain: Fentanyl x1 given in MAU #FWB: Cat II, recurrent late decels   Latonya Nelon Autry-Lott, DO  03/26/2022, 12:29 PM

## 2022-03-26 NOTE — Discharge Summary (Signed)
Postpartum Discharge Summary  Date of Service updated***     Patient Name: Kelly Huang DOB: [birth year] MRN: [id number]  Date of admission: 03/26/2022 Delivery date:03/26/2022  Delivering provider: Truett Mainland  Date of discharge: 03/26/2022  Admitting diagnosis: Preterm labor [O60.00] Intrauterine pregnancy: [redacted]w[redacted]d    Secondary diagnosis:  Principal Problem:   Preterm labor  Additional problems: placental abruption    Discharge diagnosis: Preterm Pregnancy Delivered                                              Post partum procedures: none Augmentation:  none Complications: Placental Abruption  Hospital course: Onset of Labor With Vaginal Delivery      36y.o. yo G2P0100 at 240w2das admitted in Active Labor on 03/26/2022. Labor course was complicated by placental abruption and PPROM just prior to delivery.  Membrane Rupture Time/Date: 1:03 PM ,03/26/2022   Delivery Method:Vaginal, Spontaneous  Episiotomy: None  Lacerations:  None  Patient had a postpartum course complicated by ***.  She is ambulating, tolerating a regular diet, passing flatus, and urinating well. Patient is discharged home in stable condition on 03/26/22.  Newborn Data: Birth date:03/26/2022  Birth time:1:06 PM  Gender:Female  Living status:Living  Apgars: ,  Weight:650 g   Magnesium Sulfate received: Yes: neuroprophylaxis and tocolysis BMZ received: Yes x1 Rhophylac:N/A MMR:N/A T-DaP:{Tdap:23962} Flu: {F{YJE:56314}ransfusion:{Transfusion received:30440034}  Physical exam  Vitals:   03/26/22 1255 03/26/22 1257 03/26/22 1311 03/26/22 1330  BP:  114/60 (!) 114/58 120/69  Pulse:  97 99 86  Resp:   18 14  Temp:   99.1 F (37.3 C)   TempSrc:   Axillary   SpO2: 100%   100%   General: {Exam; general:21111117} Lochia: {Desc; appropriate/inappropriate:30686::"appropriate"} Uterine Fundus: {Desc; firm/soft:30687} Incision: {Exam; incision:21111123} DVT Evaluation: {Exam;  dvt:2111122} Labs: Lab Results  Component Value Date   WBC 12.6 (H) 03/26/2022   HGB 12.4 03/26/2022   HCT 38.0 03/26/2022   MCV 84.3 03/26/2022   PLT 256 03/26/2022      Latest Ref Rng & Units 03/26/2022   11:14 AM  CMP  Glucose 70 - 99 mg/dL 91   BUN 6 - 20 mg/dL 6   Creatinine 0.44 - 1.00 mg/dL 0.63   Sodium 135 - 145 mmol/L 133   Potassium 3.5 - 5.1 mmol/L 3.5   Chloride 98 - 111 mmol/L 101   CO2 22 - 32 mmol/L 24   Calcium 8.9 - 10.3 mg/dL 8.3   Total Protein 6.5 - 8.1 g/dL 6.2   Total Bilirubin 0.3 - 1.2 mg/dL 0.4   Alkaline Phos 38 - 126 U/L 69   AST 15 - 41 U/L 13   ALT 0 - 44 U/L 10    Edinburgh Score:     No data to display           After visit meds:  Allergies as of 03/26/2022       Reactions   Bactrim [sulfamethoxazole-trimethoprim] Other (See Comments)   Patient is not sure of reaction. She stated that she was having dizzy spells.     Med Rec must be completed [facility]        Discharge home in stable condition Infant Feeding: {Baby feeding:23562} Infant Disposition:{CHL IP OB HOME WITH MOHFWYOV:78588}ischarge instruction: per After Visit Summary and Postpartum booklet. Activity:  Advance as tolerated. Pelvic rest for 6 weeks.  Diet: {OB ISNG:14159733} Future Appointments: Future Appointments  Date Time Provider [place]  04/03/2022 12:30 PM [facility] NURSE [facility]  04/03/2022 12:45 PM [facility] US7 [facility]  04/17/2022 11:15 AM Chancy Milroy, MD [facility]   Follow up Visit:   Please schedule this patient for a In person postpartum visit in 1 week with the following provider: Any provider. Additional Postpartum F/U:Postpartum Depression checkup  High risk pregnancy complicated by:  cocaine use, h/o IUFD, preterm delivery. Delivery mode:  Vaginal, Spontaneous  Anticipated Birth Control:  Depo   03/26/2022 Truett Mainland, DO

## 2022-03-26 NOTE — MAU Note (Signed)
.  Kelly Huang is a 36 y.o. at [redacted]w[redacted]d here in MAU reporting: sharp lower abdominal pain since 0915 q. 5-6 min rating it a 10/10. Pt. Endorses +FM, denies LOF. States there was a small amount of blood in her discharge this AM. Admits last cocaine use 03/22/22  EDD 07/21/22 Onset of complaint: 10/22 at 0915 Pain score: 10/10 Vitals:   03/26/22 1044 03/26/22 1045  BP: 104/64   Pulse: 95   Resp: 18   Temp: 97.9 F (36.6 C)   SpO2:  99%     FHT:150s Lab orders placed from triage:  UA

## 2022-03-27 LAB — RPR: RPR Ser Ql: NONREACTIVE

## 2022-03-27 NOTE — Clinical Social Work Maternal (Signed)
CLINICAL SOCIAL WORK MATERNAL/CHILD NOTE  Patient Details  Name: Kelly Huang MRN: [id number] Date of Birth: 07-Nov-1985  Date:  03/27/2022  Clinical Social Worker Initiating Note:  Abundio Miu, [place] Date/Time: Initiated:  03/27/22/1149     Child's Name:  Kelly Huang   Biological Parents:  Mother, Father (Father: Kelly Huang)   Need for Interpreter:  None   Reason for Referral:  Current Substance Use/Substance Use During Pregnancy  , Parental Support of Premature Babies < 78 weeks/or Critically Ill babies, Behavioral Health Concerns   Address:  [place] [address] Pregnancy Address  Discharge Address: [place], [place] [address]   Phone number:  Cell phone only works on wifi unable to recall the number due to cell phone currently being dead  Additional phone number:   Household Members/Support Persons (HM/SP):   Household Member/Support Person 1, Household Member/Support Person 2   HM/SP Name Relationship DOB or Age  HM/SP -[place] sister    HM/SP -2 Kelly Huang nephew    HM/SP -3        HM/SP -4        HM/SP -5        HM/SP -6        HM/SP -7        HM/SP -8          Natural Supports (not living in the home):  Spouse/significant other   Professional Supports: None   Employment: Unemployed   Type of Work:     Education:  [place] arranged: No  Pensions consultant:   (Medicaid Pending)   Other Resources:   (Missed [place] appointment; Did not sign up for food stamps;)   Cultural/Religious Considerations Which May Impact Care:    Strengths:  Understanding of illness   Psychotropic Medications:         Pediatrician:       Pediatrician List:   [place]      Pediatrician Fax Number:    Risk Factors/Current Problems:  Substance Use  , Mental Health Concerns  , Basic Needs      Cognitive State:  Able to Concentrate  , Alert  , Linear Thinking  , Goal Oriented  , Insightful     Mood/Affect:  Calm  , Comfortable  , Interested     CSW Assessment: CSW met with MOB at bedside to complete psychosocial assessment. MOB recalled meeting CSW during previous admission. CSW reintroduced self and explained role. MOB was welcoming, pleasant, open, and remained engaged during assessment. MOB reported that the address listed on her face sheet was her pregnancy address and explained that she will be going to stay with her sister and nephew once infant is discharged. CSW inquired about where MOB plans to stay while infant is admitted to the NICU. MOB replied here and there. CSW asked if MOB needed local shelter resources, MOB reported yes. CSW agreed to provide MOB with shelter resources. CSW explained that MOB can also room in with infant in the NICU. MOB reported that she has not started to shop for infant but has a baby bath that someone gifted her. MOB reported that she will be able to get a car seat for infant. CSW informed MOB about [employer]'s [employer] if any assistance is needed obtaining items for infant,  MOB reported that assistance with all essential items would be helpful. CSW agreed to make a referral. MOB shared that she missed her [facility] appointment, CSW encouraged MOB to call [place] from the hospital phone to follow up regarding missed appointment. CSW provided MOB with telephone number for [facility] office. MOB shared that she thought someone was applying for food stamps on her behalf but they didn't. CSW agreed to provide MOB with the link to apply for food stamps once she charges her phone and is on wifi, MOB agreeable. CSW inquried about MOB's support system, MOB reported that FOB is a support.   CSW inquired about MOB's mental health history. MOB reported that she has been diagnosed with anxiety, depression, and bipolar disorder. MOB reported that she is  not currently taking any medication nor participating in therapy to treat mental health diagnoses. CSW inquired about MOB's symptoms. MOB reported that she is currently experiencing some anxiety in correlation to infant's early delivery and a history of IUFD in the past. MOB described her anxiety as over thinking. CSW acknowledged, validated, and normalized MOB's feelings of anxiety. CSW and MOB discussed feelings/emotions associated with a NICU admission. CSW and MOB discussed healthy ways to cope with NICU admission. CSW asked if MOB was interested in therapy resources, MOB reported no. CSW inquired about how MOB was feeling emotionally since giving birth, MOB reported that her anxiety was up. MOB shared about her worries surrounding babies health and previous loss. CSW acknowledged, validated, and normalized MOB's feelings. MOB possessed insight and awareness about her feelings. MOB did not demonstrate any acute mental health signs/symptoms. CSW assessed for safety, MOB denied SI, HI, and domestic violence. MOB reported that she changed her mind and is interested in therapy resources, CSW agreed to place at infant's bedside.   CSW provided education regarding the baby blues period vs. perinatal mood disorders, discussed treatment and gave resources for mental health follow up if concerns arise.  CSW recommends self-evaluation during the postpartum time period using the New Mom Checklist from Postpartum Progress and encouraged MOB to contact a medical professional if symptoms are noted at any time.    CSW did not provide a review of Sudden Infant Death Syndrome (SIDS) precautions at this time. SIDS education will be provided prior to discharge.   CSW and MOB discussed infant's NICU admission. CSW informed MOB about the NICU, what to expect, and resources/supports available while infant is admitted to the NICU. MOB reported that she feels well informed about infant's care. MOB reported that she does have  transportation barriers to visiting infant in the NICU. CSW explained that MOB could utilize medicaid transportation once her medicaid application is approved. CSW updated MOB that Medicaid is in pending status per chart review. CSW asked if MOB was familiar with the bus system, MOB reported yes. CSW agreed to provide bus pass. MOB reported that meal vouchers would also be helpful, CSW agreed to provide meal vouchers once MOB was discharged. MOB denied any questions/concerns regarding the NICU.   CSW informed MOB about the hospital drug screen policy due to documented substance use during pregnancy. MOB confirmed cocaine use and reported last use as 6 days. MOB denied any additional substance use and reported that she stopped using alcohol months ago. CSW recalled that MOB was not interested in substance use treatment resources during previous assessment. MOB confirmed that she is still not interested in substance use treatment. CSW asked if MOB planned to continue to use cocaine, MOB  reported no. CSW inquired about MOB's plans to stop. MOB shared that she slowed down use and she hasn't had the urge to use. CSW inquired about MOB's triggers to use. MOB reported that she used due to stress. CSW acknowledged that a NICU admission can be very stressful and asked how MOB planned to refrain from using cocaine. MOB explained that she doesn't want to use because of infant and she doesn't want to lose custody. CSW positively affirmed MOB's desire to stop using and protective factors. CSW encouraged MOB to notify CSW if she becomes interested in substance use treatment resources. CSW informed MOB that infant's UDS was positive for cocaine and a CPS report would be made. MOB shared that she had planned to stop using but infant coming early was unexpected. MOB shared that her nephew Kelly Demicco) and sister Kelly Ausburn) may potentially be temporary safety providers if CPS requires that she have one. CSW  explained that CPS would follow up with MOB to make any plans.   CSW inquired about resources/supports needed, MOB reported that she needed clothing items, shelter resources, and assistance with transportation. CSW agreed to look for clothing items in clothing closet for MOB. CSW agreed to place shelter resource at infant's bedside. CSW agreed to provide bus pass.   CSW contacted [place] and made a CPS report due to infant's positive UDS for cocaine.   CSW will continue to offer resources/supports while infant is admitted to the NICU.   CSW completed FSN referral for requested items.   CSW placed therapy resources and shelter resources at infant's bedside.   Currently there are barriers to infant's discharge. Awaiting return call from CPS.   CSW Plan/Description:  Sudden Infant Death Syndrome (SIDS) Education, Perinatal Mood and Anxiety Disorder (PMADs) Education, Psychosocial Support and Ongoing Assessment of Needs, Other Patient/Family Education, [place], CSW Will Continue to Monitor Umbilical Cord Tissue Drug Screen Results and Make Report if Warranted, Child Protective Service Report  , Other Information/Referral to [employer], [place], LCSW 03/27/2022, 12:47 PM

## 2022-03-27 NOTE — Progress Notes (Signed)
POSTPARTUM PROGRESS NOTE  PPD #1  Subjective:  Kelly Huang is a 36 y.o. [id number] s/p NSVD at [redacted]w[redacted]d. Today she notes no acute complaints. She denies any problems with ambulating, voiding or po intake. Denies nausea or vomiting. She has not yet passed flatus, no BM.  Pain is well controlled.  Lochia minimal Denies fever/chills/chest pain/SOB.    Objective: Blood pressure (!) 99/57, pulse 60, temperature (!) 97.5 F (36.4 C), temperature source Oral, resp. rate 16, height 5' (1.524 m), weight 52.5 kg, last menstrual period 10/26/2021, SpO2 99 %, unknown if currently breastfeeding.  Physical Exam:  General: alert, cooperative and no distress Chest: no respiratory distress Heart: regular rate and rhythm Abdomen: soft, nontender, BS quiet Uterine Fundus: firm, appropriately tender DVT Evaluation: No calf swelling or tenderness Extremities: no edema Skin: warm, dry  Results for orders placed or performed during the hospital encounter of 03/26/22 (from the past 24 hour(s))  Rapid urine drug screen (hospital performed)     Status: Abnormal   Collection Time: 03/26/22 11:08 AM  Result Value Ref Range   Opiates NONE DETECTED NONE DETECTED   Cocaine POSITIVE (A) NONE DETECTED   Benzodiazepines NONE DETECTED NONE DETECTED   Amphetamines NONE DETECTED NONE DETECTED   Tetrahydrocannabinol NONE DETECTED NONE DETECTED   Barbiturates NONE DETECTED NONE DETECTED  Urinalysis, Routine w reflex microscopic Urine, Clean Catch     Status: Abnormal   Collection Time: 03/26/22 11:08 AM  Result Value Ref Range   Color, Urine YELLOW YELLOW   APPearance CLEAR CLEAR   Specific Gravity, Urine 1.010 1.005 - 1.030   pH 6.5 5.0 - 8.0   Glucose, UA NEGATIVE NEGATIVE mg/dL   Hgb urine dipstick MODERATE (A) NEGATIVE   Bilirubin Urine NEGATIVE NEGATIVE   Ketones, ur >80 (A) NEGATIVE mg/dL   Protein, ur NEGATIVE NEGATIVE mg/dL   Nitrite NEGATIVE NEGATIVE   Leukocytes,Ua LARGE (A) NEGATIVE  Urinalysis,  Microscopic (reflex)     Status: Abnormal   Collection Time: 03/26/22 11:08 AM  Result Value Ref Range   RBC / HPF >50 0 - 5 RBC/hpf   WBC, UA >50 0 - 5 WBC/hpf   Bacteria, UA MANY (A) NONE SEEN   Squamous Epithelial / LPF 11-20 0 - 5   Mucus PRESENT   CBC     Status: Abnormal   Collection Time: 03/26/22 11:14 AM  Result Value Ref Range   WBC 12.6 (H) 4.0 - 10.5 K/uL   RBC 4.51 3.87 - 5.11 MIL/uL   Hemoglobin 12.4 12.0 - 15.0 g/dL   HCT 38.0 36.0 - 46.0 %   MCV 84.3 80.0 - 100.0 fL   MCH 27.5 26.0 - 34.0 pg   MCHC 32.6 30.0 - 36.0 g/dL   RDW 17.9 (H) 11.5 - 15.5 %   Platelets 256 150 - 400 K/uL   nRBC 0.0 0.0 - 0.2 %  Comprehensive metabolic panel     Status: Abnormal   Collection Time: 03/26/22 11:14 AM  Result Value Ref Range   Sodium 133 (L) 135 - 145 mmol/L   Potassium 3.5 3.5 - 5.1 mmol/L   Chloride 101 98 - 111 mmol/L   CO2 24 22 - 32 mmol/L   Glucose, Bld 91 70 - 99 mg/dL   BUN 6 6 - 20 mg/dL   Creatinine, Ser 0.63 0.44 - 1.00 mg/dL   Calcium 8.3 (L) 8.9 - 10.3 mg/dL   Total Protein 6.2 (L) 6.5 - 8.1 g/dL   Albumin 2.7 (L) 3.5 -  5.0 g/dL   AST 13 (L) 15 - 41 U/L   ALT 10 0 - 44 U/L   Alkaline Phosphatase 69 38 - 126 U/L   Total Bilirubin 0.4 0.3 - 1.2 mg/dL   GFR, Estimated >26 >83 mL/min   Anion gap 8 5 - 15  Type and screen [facility]     Status: None   Collection Time: 03/26/22 11:17 AM  Result Value Ref Range   ABO/RH(D) A POS    Antibody Screen NEG    Sample Expiration      03/29/2022,2359 Performed at [facility], [address], [place], [place] [address]   Fetal fibronectin     Status: None   Collection Time: 03/26/22 11:46 AM  Result Value Ref Range   Fetal Fibronectin NEGATIVE NEGATIVE    Assessment/Plan: Kelly Huang is a 36 y.o. G2P0201 s/p NSVD at [redacted]w[redacted]d PPD#1   -cocaine use during pregnancy- social work consult -continue routine postpartum care -baby in NICU  Contraception: [place] Feeding: NICU  Dispo:  Continue routine postpartum care   LOS: 1 day   Myna Hidalgo, DO Faculty Attending, [facility] [employer] 03/27/2022, 7:23 AM

## 2022-03-28 ENCOUNTER — Other Ambulatory Visit (HOSPITAL_COMMUNITY): Payer: Self-pay

## 2022-03-28 DIAGNOSIS — O4590 Premature separation of placenta, unspecified, unspecified trimester: Secondary | ICD-10-CM | POA: Diagnosis present

## 2022-03-28 MED ORDER — SENNOSIDES-DOCUSATE SODIUM 8.6-50 MG PO TABS
2.0000 | ORAL_TABLET | Freq: Every evening | ORAL | 2 refills | Status: DC | PRN
  Filled 2022-03-28: qty 30, 15d supply, fill #0

## 2022-03-28 MED ORDER — CYCLOBENZAPRINE HCL 10 MG PO TABS
10.0000 mg | ORAL_TABLET | Freq: Three times a day (TID) | ORAL | 0 refills | Status: DC | PRN
  Filled 2022-03-28: qty 30, 10d supply, fill #0

## 2022-03-28 MED ORDER — ACETAMINOPHEN 500 MG PO TABS
1000.0000 mg | ORAL_TABLET | Freq: Four times a day (QID) | ORAL | 2 refills | Status: DC | PRN
  Filled 2022-03-28: qty 60, 8d supply, fill #0

## 2022-03-28 MED ORDER — IBUPROFEN 600 MG PO TABS
600.0000 mg | ORAL_TABLET | Freq: Four times a day (QID) | ORAL | 0 refills | Status: DC | PRN
  Filled 2022-03-28: qty 60, 15d supply, fill #0

## 2022-03-28 MED ORDER — MEDROXYPROGESTERONE ACETATE 150 MG/ML IM SUSP
150.0000 mg | Freq: Once | INTRAMUSCULAR | Status: AC
  Administered 2022-03-28: 150 mg via INTRAMUSCULAR
  Filled 2022-03-28: qty 1

## 2022-03-28 MED ORDER — OXYCODONE HCL 5 MG PO TABS
5.0000 mg | ORAL_TABLET | Freq: Four times a day (QID) | ORAL | 0 refills | Status: DC | PRN
  Filled 2022-03-28: qty 15, 4d supply, fill #0

## 2022-03-28 NOTE — Plan of Care (Signed)
  Problem: Health Behavior/Discharge Planning: Goal: Ability to manage health-related needs will improve Outcome: Adequate for Discharge   Problem: Clinical Measurements: Goal: Ability to maintain clinical measurements within normal limits will improve Outcome: Adequate for Discharge Goal: Will remain free from infection Outcome: Adequate for Discharge Goal: Diagnostic test results will improve Outcome: Adequate for Discharge Goal: Respiratory complications will improve Outcome: Adequate for Discharge Goal: Cardiovascular complication will be avoided Outcome: Adequate for Discharge   Problem: Activity: Goal: Risk for activity intolerance will decrease Outcome: Adequate for Discharge   Problem: Nutrition: Goal: Adequate nutrition will be maintained Outcome: Adequate for Discharge   Problem: Coping: Goal: Level of anxiety will decrease Outcome: Adequate for Discharge   Problem: Elimination: Goal: Will not experience complications related to bowel motility Outcome: Adequate for Discharge Goal: Will not experience complications related to urinary retention Outcome: Adequate for Discharge   Problem: Pain Managment: Goal: General experience of comfort will improve Outcome: Adequate for Discharge   Problem: Safety: Goal: Ability to remain free from injury will improve Outcome: Adequate for Discharge   Problem: Skin Integrity: Goal: Risk for impaired skin integrity will decrease Outcome: Adequate for Discharge   Problem: Education: Goal: Knowledge of condition will improve Outcome: Adequate for Discharge Goal: Individualized Educational Video(s) Outcome: Adequate for Discharge Goal: Individualized Newborn Educational Video(s) Outcome: Adequate for Discharge   Problem: Coping: Goal: Ability to identify and utilize available resources and services will improve Outcome: Adequate for Discharge   Problem: Life Cycle: Goal: Chance of risk for complications during the  postpartum period will decrease Outcome: Adequate for Discharge   Problem: Role Relationship: Goal: Ability to demonstrate positive interaction with newborn will improve Outcome: Adequate for Discharge   Problem: Skin Integrity: Goal: Demonstration of wound healing without infection will improve Outcome: Adequate for Discharge

## 2022-03-28 NOTE — Progress Notes (Signed)
CSW spoke with [place] social worker Kelly Huang [phone]) regarding CPS report made on yesterday. CPS social worker reported that she is assigned and verbalized plan to meet with MOB at hospital.   CSW met with MOB at bedside and provided update that CPS would be coming to meet with MOB. MOB provided contact information for her sister Kelly Huang [phone]) to provide to CPS. CSW agreed to return with clothing items for MOB and place meal vouchers at infant's bedside as MOB is being discharged. CSW inquired about any additional needs, MOB reported none.   CSW escorted CPS social workers ([place]) to meet with MOB at bedside. CSW provided MOB with clothing items as requested on yesterday. CPS social worker notified CSW that they had concluded meeting with MOB and requested to go to the NICU to see infant.   CSW escorted CPS social workers ([place]) to the NICU. Multiple NICU staff including Neonatologist were at the bedside caring for infant, RN reported that CPS was not able to view infant at this time. RN stepped into the hall and provided a medical update to [place] workers.   [place] social worker Heide Huang) reported that there are barriers to discharge. CPS social worker agreed to keep CSW updated of infant's discharge plan. CPS social worker reported that MOB is still able to visit with infant in the NICU.   CSW placed 6 meal vouchers at infant's bedside.   Kelly Huang, [place] Worker [facility] Cell#: [phone]

## 2022-03-28 NOTE — Progress Notes (Signed)
   03/28/22 1612  Departure Condition  Departure Condition Stable  Mobility at [facility]  Patient/Caregiver Teaching Teach Back Method Used;Discharge instructions reviewed;Prescriptions reviewed;Follow-up care reviewed;Medications discussed;Patient/caregiver verbalized understanding  Departure Mode With significant other  Was procedural sedation performed on this patient during this visit? No   Patient alert and oriented x4. Pain and vitals stable when discharge instructions given. Patient going to NICU to room-in.

## 2022-03-29 LAB — SURGICAL PATHOLOGY

## 2022-03-31 ENCOUNTER — Ambulatory Visit: Payer: Self-pay

## 2022-03-31 NOTE — Lactation Note (Signed)
This note was copied from a baby's chart. Lactation Consultation Note [place] paged to room to consult with non-breastfeeding mother who is experiencing engorgement on day 5 pp. Provided hand pump, ice packs, and instructions for relieving symptoms. Mother is aware of LC support services prn.   Mother to discard her pumped milk.  Patient Name: Kelly Huang HQION'G Date: 03/31/2022   Age:36 days    [name] 03/31/2022, 2:38 PM

## 2022-04-03 ENCOUNTER — Ambulatory Visit: Payer: Medicaid Other

## 2022-04-03 ENCOUNTER — Other Ambulatory Visit: Payer: Medicaid Other

## 2022-04-06 ENCOUNTER — Telehealth (HOSPITAL_COMMUNITY): Payer: Self-pay | Admitting: *Deleted

## 2022-04-06 NOTE — Telephone Encounter (Signed)
Left phone voicemail message.  Odis Hollingshead, RN 04-06-2022 at 2:18pm

## 2022-04-17 ENCOUNTER — Encounter: Payer: Self-pay | Admitting: Obstetrics and Gynecology

## 2022-04-17 ENCOUNTER — Ambulatory Visit (INDEPENDENT_AMBULATORY_CARE_PROVIDER_SITE_OTHER): Payer: Self-pay | Admitting: Obstetrics and Gynecology

## 2022-04-17 VITALS — BP 117/83 | HR 79 | Ht 60.0 in | Wt 117.0 lb

## 2022-04-17 DIAGNOSIS — F419 Anxiety disorder, unspecified: Secondary | ICD-10-CM

## 2022-04-17 DIAGNOSIS — F53 Postpartum depression: Secondary | ICD-10-CM | POA: Insufficient documentation

## 2022-04-17 MED ORDER — ALBUTEROL SULFATE HFA 108 (90 BASE) MCG/ACT IN AERS: 2.0000 | INHALATION_SPRAY | Freq: Four times a day (QID) | RESPIRATORY_TRACT | 2 refills | Status: DC | PRN

## 2022-04-17 MED ORDER — SERTRALINE HCL 50 MG PO TABS: 50.0000 mg | ORAL_TABLET | Freq: Every day | ORAL | 5 refills | Status: DC

## 2022-04-17 MED ORDER — CYCLOBENZAPRINE HCL 10 MG PO TABS: 10.0000 mg | ORAL_TABLET | Freq: Three times a day (TID) | ORAL | 0 refills | Status: DC | PRN

## 2022-04-17 NOTE — Patient Instructions (Signed)

## 2022-04-17 NOTE — Progress Notes (Signed)
    Post Partum Visit Note  Kelly Huang is a 36 y.o. G86P0201 female who presents for a postpartum visit. She is 3 weeks postpartum following a normal spontaneous vaginal delivery.  I have fully reviewed the prenatal and intrapartum course. The delivery was at 23/2 gestational weeks.  Anesthesia: none. Postpartum course has been unremarkable. Baby passed away this past 2022-09-13. Bleeding moderate lochia. Bowel function is normal. Bladder function is normal. Patient is not sexually active. Contraception method is Depo-Provera injections. Postpartum depression screening: positive.   The pregnancy intention screening data noted above was reviewed. Potential methods of contraception were discussed. The patient elected to proceed with No data recorded.    Health Maintenance Due  Topic Date Due   COVID-19 Vaccine (1) Never done    Medical Records  Review of Systems Pertinent items are noted in HPI.  Objective:  LMP 10/26/2021 (Approximate)    General:  alert   Breasts:  not indicated  Lungs: clear to auscultation bilaterally  Heart:  regular rate and rhythm, S1, S2 normal, no murmur, click, rub or gallop  Abdomen: soft, non-tender; bowel sounds normal; no masses,  no organomegaly   Wound NA  GU exam:  not indicated       Assessment:    There are no diagnoses linked to this encounter.  Nl postpartum exam.   Plan:   Essential components of care per ACOG recommendations:  1.  Mood and well being: Patient with positive depression screening today. Reviewed local resources for support.  - Patient tobacco use? No.   - hx of drug use? Yes. Referral to [facility] accepted.    2. Infant care passed away this past 09/13/22  3. Sexuality, contraception and birth spacing - Patient does not want a pregnancy in the next year.  Desired family size is uncertain  - Reviewed reproductive life planning. Reviewed contraceptive methods based on pt preferences and effectiveness.  Patient desired  Hormonal Injection today.   - Discussed birth spacing of 18 months  4. Sleep and fatigue -Encouraged family/partner/community support of 4 hrs of uninterrupted sleep to help with mood and fatigue  5. Physical Recovery  - Discussed patients delivery and complications. She describes her labor as mixed. - Patient had a Vaginal, no problems at delivery. - Patient has urinary incontinence? No. - Patient is safe to resume physical and sexual activity  6.  Health Maintenance - HM due items addressed Yes - Last pap smear  Diagnosis  Date Value Ref Range Status  03/01/2022   Final   - Negative for intraepithelial lesion or malignancy (NILM)   Pap smear not done at today's visit.  -Breast Cancer screening indicated? No.   7. Chronic Disease/Pregnancy Condition   Will start pt on Zoloft and refer to [facility]. Advised pt to find PCP for routine HM care    [place] for [employer], [facility]

## 2022-04-24 NOTE — BH Specialist Note (Unsigned)
Pt did not arrive to video visit and did not answer the phone; Left HIPPA-compliant message to call back Lindalee Huizinga from [facility] at  [phone] (Syncere Kaminski's office).  ?; left MyChart message for patient.  ? ?

## 2022-04-25 ENCOUNTER — Ambulatory Visit: Payer: Medicaid Other | Admitting: Clinical

## 2022-04-25 DIAGNOSIS — Z91199 Patient's noncompliance with other medical treatment and regimen due to unspecified reason: Secondary | ICD-10-CM

## 2022-05-27 ENCOUNTER — Other Ambulatory Visit: Payer: Self-pay

## 2022-05-27 ENCOUNTER — Emergency Department (HOSPITAL_COMMUNITY)
Admission: EM | Admit: 2022-05-27 | Discharge: 2022-05-28 | Disposition: A | Discharge status: Home / Self Care | Payer: Medicaid Other | Attending: Emergency Medicine | Admitting: Emergency Medicine

## 2022-05-27 ENCOUNTER — Encounter (HOSPITAL_COMMUNITY): Payer: Self-pay | Admitting: Emergency Medicine

## 2022-05-27 ENCOUNTER — Emergency Department (HOSPITAL_COMMUNITY): Payer: Medicaid Other

## 2022-05-27 DIAGNOSIS — J45909 Unspecified asthma, uncomplicated: Secondary | ICD-10-CM | POA: Insufficient documentation

## 2022-05-27 DIAGNOSIS — R000 Tachycardia, unspecified: Secondary | ICD-10-CM | POA: Diagnosis not present

## 2022-05-27 DIAGNOSIS — Z1152 Encounter for screening for COVID-19: Secondary | ICD-10-CM | POA: Diagnosis not present

## 2022-05-27 DIAGNOSIS — J9811 Atelectasis: Secondary | ICD-10-CM | POA: Diagnosis not present

## 2022-05-27 DIAGNOSIS — J189 Pneumonia, unspecified organism: Secondary | ICD-10-CM

## 2022-05-27 DIAGNOSIS — J181 Lobar pneumonia, unspecified organism: Secondary | ICD-10-CM | POA: Insufficient documentation

## 2022-05-27 DIAGNOSIS — R0602 Shortness of breath: Secondary | ICD-10-CM | POA: Diagnosis not present

## 2022-05-27 DIAGNOSIS — R0789 Other chest pain: Secondary | ICD-10-CM | POA: Diagnosis present

## 2022-05-27 DIAGNOSIS — N3001 Acute cystitis with hematuria: Secondary | ICD-10-CM | POA: Insufficient documentation

## 2022-05-27 DIAGNOSIS — J168 Pneumonia due to other specified infectious organisms: Secondary | ICD-10-CM | POA: Diagnosis not present

## 2022-05-27 DIAGNOSIS — R0600 Dyspnea, unspecified: Secondary | ICD-10-CM | POA: Diagnosis not present

## 2022-05-27 DIAGNOSIS — R079 Chest pain, unspecified: Secondary | ICD-10-CM | POA: Diagnosis not present

## 2022-05-27 DIAGNOSIS — R059 Cough, unspecified: Secondary | ICD-10-CM | POA: Diagnosis not present

## 2022-05-27 LAB — BASIC METABOLIC PANEL
Anion gap: 15 (ref 5–15)
BUN: 11 mg/dL (ref 6–20)
CO2: 24 mmol/L (ref 22–32)
Calcium: 8.8 mg/dL — ABNORMAL LOW (ref 8.9–10.3)
Chloride: 97 mmol/L — ABNORMAL LOW (ref 98–111)
Creatinine, Ser: 0.98 mg/dL (ref 0.44–1.00)
GFR, Estimated: >60 mL/min (ref >60)
Glucose, Bld: 107 mg/dL — ABNORMAL HIGH (ref 70–99)
Potassium: 3.1 mmol/L — ABNORMAL LOW (ref 3.5–5.1)
Sodium: 136 mmol/L (ref 135–145)

## 2022-05-27 LAB — CBC
HCT: 41.8 % (ref 36.0–46.0)
Hemoglobin: 13.5 g/dL (ref 12.0–15.0)
MCH: 28.6 pg (ref 26.0–34.0)
MCHC: 32.3 g/dL (ref 30.0–36.0)
MCV: 88.6 fL (ref 80.0–100.0)
Platelets: 348 10*3/uL (ref 150–400)
RBC: 4.72 MIL/uL (ref 3.87–5.11)
RDW: 15.6 % — ABNORMAL HIGH (ref 11.5–15.5)
WBC: 23.1 10*3/uL — ABNORMAL HIGH (ref 4.0–10.5)
nRBC: 0 % (ref 0.0–0.2)

## 2022-05-27 LAB — RESP PANEL BY RT-PCR (RSV, FLU A&B, COVID)  RVPGX2
Influenza A by PCR: NEGATIVE
Influenza B by PCR: NEGATIVE
Resp Syncytial Virus by PCR: NEGATIVE
SARS Coronavirus 2 by RT PCR: NEGATIVE

## 2022-05-27 LAB — TROPONIN I (HIGH SENSITIVITY)
Troponin I (High Sensitivity): 19 ng/L — ABNORMAL HIGH (ref <18)
Troponin I (High Sensitivity): 8 ng/L (ref <18)

## 2022-05-27 MED ORDER — ACETAMINOPHEN 500 MG PO TABS
1000.0000 mg | ORAL_TABLET | Freq: Once | ORAL | Status: AC
  Administered 2022-05-27: 1000 mg via ORAL
  Filled 2022-05-27: qty 2

## 2022-05-27 NOTE — ED Provider Triage Note (Signed)
Emergency Medicine Provider Triage Evaluation Note  Shadi Larner , a 36 y.o. female  was evaluated in triage.  Pt complains of sob, cough, congestion, wheezing, and chest tightness onset last night. No fever at home. No known sick contacts. No abdominal pain, nausea, vomiting, or diarrhea. Hx asthma. Chest pain is diffuse to anterior chest wall, sharp, intermittently pleuritic. No radiation, severe pain, diaphoresis.   Review of Systems  Positive: See HPI Negative: See HPI  Physical Exam  BP 117/78   Pulse (!) 130   Temp (!) 101.1 F (38.4 C) (Oral)   Resp 20   SpO2 97%  Gen:   Awake, no distress  mildly ill appearing Resp:  Normal effort diminished BS but no overt wheezing or other abnormal lung sounds MSK:   Moves extremities without difficulty no LE edema Other:  Tachycardic, coarse cough, nasal congestion  Medical Decision Making  Medically screening exam initiated at 4:35 PM.  Appropriate orders placed.  Alanee Ting was informed that the remainder of the evaluation will be completed by another provider, this initial triage assessment does not replace that evaluation, and the importance of remaining in the ED until their evaluation is complete.     Tonette Lederer, PA-C 05/27/22 1644

## 2022-05-27 NOTE — ED Notes (Signed)
Pt stated tow writer that she was having severe pain in her left breast and "can't take it anymore". This Clinical research associate reassessed vitals and alerted the triage RN. Repeat EKG was performed and patient was returned to lobby.

## 2022-05-27 NOTE — ED Notes (Signed)
Called x 1 no answer

## 2022-05-27 NOTE — ED Triage Notes (Signed)
Patient arrives POV w/ chest pain since 8 pm last night that started while she was cleaning. Patient states it is occurring under her left breast nonradiating. Endorses shortness of breath and cough. Fever here in triage. Denies n/v/d.

## 2022-05-28 ENCOUNTER — Emergency Department (HOSPITAL_COMMUNITY): Payer: Medicaid Other

## 2022-05-28 DIAGNOSIS — R0602 Shortness of breath: Secondary | ICD-10-CM | POA: Diagnosis not present

## 2022-05-28 DIAGNOSIS — R059 Cough, unspecified: Secondary | ICD-10-CM | POA: Diagnosis not present

## 2022-05-28 DIAGNOSIS — R079 Chest pain, unspecified: Secondary | ICD-10-CM | POA: Diagnosis not present

## 2022-05-28 LAB — URINALYSIS, ROUTINE W REFLEX MICROSCOPIC
Bilirubin Urine: NEGATIVE
Glucose, UA: NEGATIVE mg/dL
Ketones, ur: 5 mg/dL — AB
Leukocytes,Ua: NEGATIVE
Nitrite: POSITIVE — AB
Protein, ur: >=300 mg/dL — AB
Specific Gravity, Urine: >1.046 — ABNORMAL HIGH (ref 1.005–1.030)
pH: 5 (ref 5.0–8.0)

## 2022-05-28 LAB — RAPID URINE DRUG SCREEN, HOSP PERFORMED
Amphetamines: NOT DETECTED
Barbiturates: NOT DETECTED
Benzodiazepines: NOT DETECTED
Cocaine: POSITIVE — AB
Opiates: NOT DETECTED
Tetrahydrocannabinol: NOT DETECTED

## 2022-05-28 MED ORDER — SODIUM CHLORIDE 0.9 % IV SOLN
500.0000 mg | Freq: Once | INTRAVENOUS | Status: AC
  Administered 2022-05-28: 500 mg via INTRAVENOUS
  Filled 2022-05-28: qty 5

## 2022-05-28 MED ORDER — SODIUM CHLORIDE 0.9 % IV SOLN
1.0000 g | Freq: Once | INTRAVENOUS | Status: AC
  Administered 2022-05-28: 1 g via INTRAVENOUS
  Filled 2022-05-28: qty 10

## 2022-05-28 MED ORDER — IOHEXOL 350 MG/ML SOLN
75.0000 mL | Freq: Once | INTRAVENOUS | Status: AC | PRN
  Administered 2022-05-28: 75 mL via INTRAVENOUS

## 2022-05-28 MED ORDER — LEVOFLOXACIN 500 MG PO TABS: 500.0000 mg | ORAL_TABLET | Freq: Every day | ORAL | 0 refills | Status: DC

## 2022-05-28 NOTE — Discharge Instructions (Addendum)
You were diagnosed today with left upper lobe pneumonia and a urinary tract infection.  Please take the prescribed antibiotic as directed.  If your symptoms worsen or you become significantly short of breath please return to the emergency department for further evaluation. Your urine showed evidence of cocaine use.  Please refrain from using cocaine as this can cause chest pains. Please schedule a follow-up appointment with the [facility] health community health and wellness center for further evaluation and management.  You should try to have a follow-up chest x-ray in 3 weeks or so to be sure that your pneumonia has resolved.

## 2022-05-28 NOTE — ED Provider Notes (Signed)
[facility] EMERGENCY DEPARTMENT Provider Note   CSN: [id number] Arrival date & time: 05/27/22  1555     History  Chief Complaint  Patient presents with   Chest Pain    Kelly Huang is a 36 y.o. female.  Patient presents the emergency department complaining of left-sided chest pain which began on Friday.  The patient denies any aggravating or alleviating factors.  Patient states that she has had a cough and shortness of breath.  She also endorses abdominal pain which is worse when she coughs.  She denies nausea, vomiting, diarrhea, headache.  Patient was notably febrile in triage with a temperature of 101.1 F and a heart rate of 130.  Patient has past medical history significant for seizures, substance abuse, asthma  HPI     Home Medications Prior to Admission medications   Medication Sig Start Date End Date Taking? Authorizing Provider  levofloxacin (LEVAQUIN) 500 MG tablet Take 1 tablet (500 mg total) by mouth daily. 05/28/22  Yes Darrick Grinder, PA-C  acetaminophen (TYLENOL) 500 MG tablet Take 2 tablets (1,000 mg total) by mouth every 6 (six) hours as needed for moderate pain or fever (for pain scale < 4). 03/28/22   Anyanwu, Jethro Bastos, MD  albuterol (VENTOLIN HFA) 108 (90 Base) MCG/ACT inhaler Inhale 2 puffs into the lungs every 6 (six) hours as needed for wheezing or shortness of breath. 04/17/22   Hermina Staggers, MD  cyclobenzaprine (FLEXERIL) 10 MG tablet Take 1 tablet (10 mg total) by mouth 3 (three) times daily as needed for muscle spasms. 04/17/22   Hermina Staggers, MD  ibuprofen (ADVIL) 600 MG tablet Take 1 tablet (600 mg total) by mouth every 6 (six) hours as needed for mild pain or moderate pain. 03/28/22   Anyanwu, Jethro Bastos, MD  oxyCODONE (OXY IR/ROXICODONE) 5 MG immediate release tablet Take 1 tablet (5 mg total) by mouth every 6 (six) hours as needed for severe pain or breakthrough pain (pain scale 4-7). 03/28/22   Anyanwu, Jethro Bastos, MD   senna-docusate (SENOKOT-S) 8.6-50 MG tablet Take 2 tablets by mouth at bedtime as needed for mild constipation or moderate constipation. 03/28/22   Anyanwu, Jethro Bastos, MD  sertraline (ZOLOFT) 50 MG tablet Take 1 tablet (50 mg total) by mouth daily. 04/17/22   Hermina Staggers, MD      Allergies    Bactrim [sulfamethoxazole-trimethoprim]    Review of Systems   Review of Systems  Constitutional:  Positive for fever.  Respiratory:  Positive for cough and shortness of breath.   Cardiovascular:  Positive for chest pain.  Gastrointestinal:  Positive for abdominal pain. Negative for diarrhea, nausea and vomiting.  Genitourinary:  Negative for dysuria.  Musculoskeletal:  Negative for arthralgias.  Neurological:  Negative for headaches.    Physical Exam Updated Vital Signs BP 114/77 (BP Location: Right Arm)   Pulse (!) 104   Temp 99.2 F (37.3 C) (Oral)   Resp (!) 26   SpO2 96%   Breastfeeding Unknown  Physical Exam Vitals and nursing note reviewed.  Constitutional:      General: She is not in acute distress.    Appearance: She is well-developed.  HENT:     Head: Normocephalic and atraumatic.     Mouth/Throat:     Mouth: Mucous membranes are moist.  Eyes:     Conjunctiva/sclera: Conjunctivae normal.  Cardiovascular:     Rate and Rhythm: Normal rate and regular rhythm.     Heart sounds:  No murmur heard. Pulmonary:     Effort: Pulmonary effort is normal. No respiratory distress.     Breath sounds: Examination of the left-upper field reveals decreased breath sounds and rhonchi. Examination of the right-middle field reveals wheezing. Examination of the left-middle field reveals wheezing and rhonchi. Examination of the right-lower field reveals wheezing. Examination of the left-lower field reveals wheezing. Decreased breath sounds, wheezing and rhonchi present.  Abdominal:     Palpations: Abdomen is soft.     Tenderness: There is no abdominal tenderness.  Musculoskeletal:         General: No swelling.     Cervical back: Neck supple.  Skin:    General: Skin is warm and dry.     Capillary Refill: Capillary refill takes less than 2 seconds.  Neurological:     Mental Status: She is alert.  Psychiatric:        Mood and Affect: Mood normal.     ED Results / Procedures / Treatments   Labs (all labs ordered are listed, but only abnormal results are displayed) Labs Reviewed  BASIC METABOLIC PANEL - Abnormal; Notable for the following components:      Result Value   Potassium 3.1 (*)    Chloride 97 (*)    Glucose, Bld 107 (*)    Calcium 8.8 (*)    All other components within normal limits  CBC - Abnormal; Notable for the following components:   WBC 23.1 (*)    RDW 15.6 (*)    All other components within normal limits  RAPID URINE DRUG SCREEN, HOSP PERFORMED - Abnormal; Notable for the following components:   Cocaine POSITIVE (*)    All other components within normal limits  URINALYSIS, ROUTINE W REFLEX MICROSCOPIC - Abnormal; Notable for the following components:   Color, Urine AMBER (*)    APPearance HAZY (*)    Specific Gravity, Urine >1.046 (*)    Hgb urine dipstick MODERATE (*)    Ketones, ur 5 (*)    Protein, ur >=300 (*)    Nitrite POSITIVE (*)    Bacteria, UA MANY (*)    All other components within normal limits  TROPONIN I (HIGH SENSITIVITY) - Abnormal; Notable for the following components:   Troponin I (High Sensitivity) 19 (*)    All other components within normal limits  RESP PANEL BY RT-PCR (RSV, FLU A&B, COVID)  RVPGX2  PREGNANCY, URINE  TROPONIN I (HIGH SENSITIVITY)    EKG EKG Interpretation  Date/Time:  Saturday May 27 2022 20:23:08 EST Ventricular Rate:  110 PR Interval:  138 QRS Duration: 90 QT Interval:  294 QTC Calculation: 397 R Axis:   103 Text Interpretation: Sinus tachycardia Rightward axis T wave abnormality, consider inferior ischemia Abnormal ECG When compared with ECG of 27-May-2022 16:27, PREVIOUS ECG IS PRESENT  No significant change since last tracing Confirmed by Jacalyn LefevreHaviland, Julie [phone]([id number]) on 05/28/2022 9:44:30 AM  Radiology CT Angio Chest Pulmonary Embolism (PE) W or WO Contrast  Result Date: 05/28/2022 CLINICAL DATA:  Chest pain since last night. Short of breath. Cough. EXAM: CT ANGIOGRAPHY CHEST WITH CONTRAST TECHNIQUE: Multidetector CT imaging of the chest was performed using the standard protocol during bolus administration of intravenous contrast. Multiplanar CT image reconstructions and MIPs were obtained to evaluate the vascular anatomy. RADIATION DOSE REDUCTION: This exam was performed according to the departmental dose-optimization program which includes automated exposure control, adjustment of the mA and/or kV according to patient size and/or use of iterative reconstruction technique. CONTRAST:  88mL OMNIPAQUE IOHEXOL 350 MG/ML SOLN COMPARISON:  Chest radiograph, 05/27/2022. FINDINGS: Cardiovascular: Away arteries are well opacified. There is no evidence of a pulmonary embolism. Heart top-normal in size. No pericardial effusion. Aorta normal in caliber. No dissection or atherosclerosis. Enlarged main pulmonary artery measuring 3.2 cm. Mediastinum/Nodes: No neck base mass. No enlarged axillary lymph nodes. Prominent and enlarged shoddy mediastinal lymph nodes. Largest is a prevascular node, to the left of the aortic arch, 1.7 cm in short axis. Mildly enlarged left hilar lymph nodes measuring up to 1.3 cm in short axis. Trachea and esophagus are unremarkable. Lungs/Pleura: Dense consolidation in the left upper lobe. Linear and patchy opacity noted at the base of the left lower lobe consistent with atelectasis. Remainder of the lungs is clear. No pleural effusion. No pneumothorax. Upper Abdomen: Unremarkable. Musculoskeletal: No fracture or acute finding. No bone lesion. No chest wall mass. Review of the MIP images confirms the above findings. IMPRESSION: 1. No evidence of a pulmonary embolism. 2. Dense  consolidation in the left upper lobe consistent with pneumonia, associated with mediastinal and left hilar lymphadenopathy. Electronically Signed   By: Amie Portland M.D.   On: 05/28/2022 10:46   DG Chest 2 View  Result Date: 05/27/2022 CLINICAL DATA:  Cough, dyspnea EXAM: CHEST - 2 VIEW COMPARISON:  01/09/2018 FINDINGS: There is dense consolidation within the lateral left upper lobe, possibly reflecting changes of acute lobar pneumonia in the appropriate clinical setting. Mild left basilar atelectasis and left-sided volume loss. Right lung is clear. No pneumothorax or pleural effusion. Cardiac size within normal limits. Pulmonary vascularity is normal. No acute bone abnormality. IMPRESSION: 1. Left upper lobe pneumonia. Follow-up chest radiograph in 3-4 weeks is recommended to document complete resolution. Electronically Signed   By: Helyn Numbers M.D.   On: 05/27/2022 17:40    Procedures Procedures    Medications Ordered in ED Medications  acetaminophen (TYLENOL) tablet 1,000 mg (1,000 mg Oral Given 05/27/22 1758)  cefTRIAXone (ROCEPHIN) 1 g in sodium chloride 0.9 % 100 mL IVPB (0 g Intravenous Stopped 05/28/22 0837)  azithromycin (ZITHROMAX) 500 mg in sodium chloride 0.9 % 250 mL IVPB (0 mg Intravenous Stopped 05/28/22 0958)  iohexol (OMNIPAQUE) 350 MG/ML injection 75 mL (75 mLs Intravenous Contrast Given 05/28/22 1011)    ED Course/ Medical Decision Making/ A&P                           Medical Decision Making Amount and/or Complexity of Data Reviewed Labs: ordered. Radiology: ordered.  Risk Prescription drug management.   This patient presents to the ED for concern of chest pain, shortness of breath, this involves an extensive number of treatment options, and is a complaint that carries with it a high risk of complications and morbidity.  The differential diagnosis includes pneumonia, PE, ACS, viral illness and others   Co morbidities that complicate the patient  evaluation  History of seizures, asthma, substance abuse   Additional history obtained:   External records from outside source obtained and reviewed including admission in October of this year for preterm labor, discharged on October 24, with complications including cocaine abuse, no prenatal care during pregnancy   Lab Tests:  I Ordered, and personally interpreted labs.  The pertinent results include: WBC 23.1, potassium 3.1, negative respiratory panel, initial troponin 19, repeat troponin 8, UDS with positive cocaine result, UA with signs of infection   Imaging Studies ordered:  I ordered imaging studies including chest x-ray  and CT angio chest PE study I independently visualized and interpreted imaging which showed left upper lobe pneumonia.  CT angio PE study shows no signs of pulmonary embolism. I agree with the radiologist interpretation   Cardiac Monitoring: / EKG:  The patient was maintained on a cardiac monitor.  I personally viewed and interpreted the cardiac monitored which showed an underlying rhythm of: Sinus tachycardia   Problem List / ED Course / Critical interventions / Medication management   I ordered medication including azithromycin and Rocephin for infection, Tylenol for fever Reevaluation of the patient after these medicines showed that the patient stayed the same I have reviewed the patients home medicines and have made adjustments as needed   Social Determinants of Health:  Patient is homeless   Test / Admission - Considered:  The patient has been tachycardic but that has resolved with Tylenol.  Patient is currently on room air with oxygen saturations in the upper 90s.  She does have evidence of UTI on her urinalysis and has a left upper lobe pneumonia on chest x-ray which was confirmed with the chest CT.  No PE on PE study.  Plan to discharge patient with prescription for Levaquin for double coverage for both the UTI and pneumonia.  Patient will  need follow-up in 3 weeks or so with the [facility] to check for resolution of symptoms.  Return precautions provided.        Final Clinical Impression(s) / ED Diagnoses Final diagnoses:  Pneumonia of left upper lobe due to infectious organism  Acute cystitis with hematuria    Rx / DC Orders ED Discharge Orders          Ordered    levofloxacin (LEVAQUIN) 500 MG tablet  Daily        05/28/22 1218              Pamala Duffel 05/28/22 1221    Mesner, Barbara Cower, MD 06/04/22 [phone]

## 2022-05-28 NOTE — ED Notes (Signed)
Patient transported to CT 

## 2022-05-29 ENCOUNTER — Inpatient Hospital Stay (HOSPITAL_COMMUNITY)
Admission: EM | Admit: 2022-05-29 | Discharge: 2022-06-01 | DRG: 194 | Disposition: A | Discharge status: Home / Self Care | Payer: Medicaid Other | Attending: Internal Medicine | Admitting: Internal Medicine

## 2022-05-29 ENCOUNTER — Encounter (HOSPITAL_COMMUNITY): Payer: Self-pay

## 2022-05-29 ENCOUNTER — Emergency Department (HOSPITAL_COMMUNITY): Payer: Medicaid Other

## 2022-05-29 DIAGNOSIS — J9811 Atelectasis: Secondary | ICD-10-CM | POA: Diagnosis not present

## 2022-05-29 DIAGNOSIS — F1410 Cocaine abuse, uncomplicated: Secondary | ICD-10-CM | POA: Diagnosis present

## 2022-05-29 DIAGNOSIS — R079 Chest pain, unspecified: Secondary | ICD-10-CM | POA: Diagnosis not present

## 2022-05-29 DIAGNOSIS — F1010 Alcohol abuse, uncomplicated: Secondary | ICD-10-CM | POA: Diagnosis present

## 2022-05-29 DIAGNOSIS — E876 Hypokalemia: Secondary | ICD-10-CM | POA: Diagnosis present

## 2022-05-29 DIAGNOSIS — Z5900 Homelessness unspecified: Secondary | ICD-10-CM

## 2022-05-29 DIAGNOSIS — J189 Pneumonia, unspecified organism: Principal | ICD-10-CM | POA: Diagnosis present

## 2022-05-29 DIAGNOSIS — Z1152 Encounter for screening for COVID-19: Secondary | ICD-10-CM

## 2022-05-29 DIAGNOSIS — F17210 Nicotine dependence, cigarettes, uncomplicated: Secondary | ICD-10-CM | POA: Diagnosis present

## 2022-05-29 DIAGNOSIS — R0789 Other chest pain: Secondary | ICD-10-CM | POA: Diagnosis not present

## 2022-05-29 DIAGNOSIS — Z79899 Other long term (current) drug therapy: Secondary | ICD-10-CM

## 2022-05-29 LAB — COMPREHENSIVE METABOLIC PANEL
ALT: 21 U/L (ref 0–44)
AST: 28 U/L (ref 15–41)
Albumin: 2.8 g/dL — ABNORMAL LOW (ref 3.5–5.0)
Alkaline Phosphatase: 39 U/L (ref 38–126)
Anion gap: 12 (ref 5–15)
BUN: 11 mg/dL (ref 6–20)
CO2: 26 mmol/L (ref 22–32)
Calcium: 8.7 mg/dL — ABNORMAL LOW (ref 8.9–10.3)
Chloride: 99 mmol/L (ref 98–111)
Creatinine, Ser: 0.72 mg/dL (ref 0.44–1.00)
GFR, Estimated: >60 mL/min (ref >60)
Glucose, Bld: 100 mg/dL — ABNORMAL HIGH (ref 70–99)
Potassium: 3 mmol/L — ABNORMAL LOW (ref 3.5–5.1)
Sodium: 137 mmol/L (ref 135–145)
Total Bilirubin: 0.7 mg/dL (ref 0.3–1.2)
Total Protein: 6.9 g/dL (ref 6.5–8.1)

## 2022-05-29 LAB — CBC WITH DIFFERENTIAL/PLATELET
Abs Immature Granulocytes: 0.07 10*3/uL (ref 0.00–0.07)
Basophils Absolute: 0 10*3/uL (ref 0.0–0.1)
Basophils Relative: 1 %
Eosinophils Absolute: 0.1 10*3/uL (ref 0.0–0.5)
Eosinophils Relative: 1 %
HCT: 38.9 % (ref 36.0–46.0)
Hemoglobin: 12.3 g/dL (ref 12.0–15.0)
Immature Granulocytes: 1 %
Lymphocytes Relative: 22 %
Lymphs Abs: 1.7 10*3/uL (ref 0.7–4.0)
MCH: 28.1 pg (ref 26.0–34.0)
MCHC: 31.6 g/dL (ref 30.0–36.0)
MCV: 88.8 fL (ref 80.0–100.0)
Monocytes Absolute: 0.8 10*3/uL (ref 0.1–1.0)
Monocytes Relative: 10 %
Neutro Abs: 5.2 10*3/uL (ref 1.7–7.7)
Neutrophils Relative %: 65 %
Platelets: 403 10*3/uL — ABNORMAL HIGH (ref 150–400)
RBC: 4.38 MIL/uL (ref 3.87–5.11)
RDW: 15.2 % (ref 11.5–15.5)
WBC: 7.9 10*3/uL (ref 4.0–10.5)
nRBC: 0 % (ref 0.0–0.2)

## 2022-05-29 LAB — LACTIC ACID, PLASMA
Lactic Acid, Venous: 1 mmol/L (ref 0.5–1.9)
Lactic Acid, Venous: 1 mmol/L (ref 0.5–1.9)

## 2022-05-29 LAB — LIPASE, BLOOD: Lipase: 23 U/L (ref 11–51)

## 2022-05-29 MED ORDER — POTASSIUM CHLORIDE CRYS ER 20 MEQ PO TBCR
40.0000 meq | EXTENDED_RELEASE_TABLET | Freq: Once | ORAL | Status: AC
  Administered 2022-05-29: 40 meq via ORAL
  Filled 2022-05-29: qty 2

## 2022-05-29 MED ORDER — ALBUTEROL SULFATE HFA 108 (90 BASE) MCG/ACT IN AERS
2.0000 | INHALATION_SPRAY | Freq: Once | RESPIRATORY_TRACT | Status: AC
  Administered 2022-05-29: 2 via RESPIRATORY_TRACT
  Filled 2022-05-29: qty 6.7

## 2022-05-29 MED ORDER — ENOXAPARIN SODIUM 40 MG/0.4ML IJ SOSY
40.0000 mg | PREFILLED_SYRINGE | INTRAMUSCULAR | Status: DC
  Administered 2022-05-30 – 2022-05-31 (×2): 40 mg via SUBCUTANEOUS
  Filled 2022-05-29 (×3): qty 0.4

## 2022-05-29 MED ORDER — SODIUM CHLORIDE 0.9 % IV BOLUS
500.0000 mL | Freq: Once | INTRAVENOUS | Status: AC
  Administered 2022-05-29: 500 mL via INTRAVENOUS

## 2022-05-29 MED ORDER — ACETAMINOPHEN 325 MG PO TABS
650.0000 mg | ORAL_TABLET | Freq: Four times a day (QID) | ORAL | Status: DC | PRN
  Administered 2022-05-30 – 2022-06-01 (×5): 650 mg via ORAL
  Filled 2022-05-29 (×5): qty 2

## 2022-05-29 MED ORDER — SODIUM CHLORIDE 0.9 % IV SOLN
2.0000 g | INTRAVENOUS | Status: DC
  Administered 2022-05-30 – 2022-05-31 (×2): 2 g via INTRAVENOUS
  Filled 2022-05-29 (×2): qty 20

## 2022-05-29 MED ORDER — SODIUM CHLORIDE 0.9 % IV SOLN
500.0000 mg | INTRAVENOUS | Status: DC
  Administered 2022-05-30 – 2022-05-31 (×2): 500 mg via INTRAVENOUS
  Filled 2022-05-29 (×2): qty 5

## 2022-05-29 MED ORDER — KETOROLAC TROMETHAMINE 30 MG/ML IJ SOLN
30.0000 mg | Freq: Once | INTRAMUSCULAR | Status: AC
  Administered 2022-05-29: 30 mg via INTRAVENOUS
  Filled 2022-05-29: qty 1

## 2022-05-29 MED ORDER — ONDANSETRON HCL 4 MG/2ML IJ SOLN
4.0000 mg | Freq: Once | INTRAMUSCULAR | Status: AC
  Administered 2022-05-29: 4 mg via INTRAVENOUS
  Filled 2022-05-29: qty 2

## 2022-05-29 MED ORDER — ONDANSETRON HCL 4 MG/2ML IJ SOLN: 4.0000 mg | Freq: Four times a day (QID) | INTRAMUSCULAR | Status: DC | PRN

## 2022-05-29 MED ORDER — ACETAMINOPHEN 650 MG RE SUPP: 650.0000 mg | Freq: Four times a day (QID) | RECTAL | Status: DC | PRN

## 2022-05-29 MED ORDER — ONDANSETRON HCL 4 MG PO TABS: 4.0000 mg | ORAL_TABLET | Freq: Four times a day (QID) | ORAL | Status: DC | PRN

## 2022-05-29 MED ORDER — LEVOFLOXACIN IN D5W 500 MG/100ML IV SOLN
500.0000 mg | Freq: Once | INTRAVENOUS | Status: AC
  Administered 2022-05-29: 500 mg via INTRAVENOUS
  Filled 2022-05-29: qty 100

## 2022-05-29 NOTE — ED Provider Notes (Signed)
Emergency Department Provider Note   I have reviewed the triage vital signs and the nursing notes.   HISTORY  Chief Complaint Cough   HPI Kelly Huang is a 36 y.o. female with PMH reviewed below presents to the ED with continued CP, cough, and SOB.  She was seen in the emergency department yesterday with workup at that time for chest pain and was ultimately diagnosed with pneumonia.  She was discharged home with antibiotics (Levaquin) but states she did not have time to get this filled and returns today with worsening symptoms. Reports fever and mild productive cough. Pain is worse with cough. No vomiting or diarrhea. No abdominal pain.   Past Medical History:  Diagnosis Date   Alcohol use complicating pregnancy 02/18/2022   Asthma    Asthma affecting pregnancy in second trimester 03/01/2022   Cocaine use complicating pregnancy 02/18/2022   Herniated lumbar intervertebral disc    Seizures (HCC)     Review of Systems  Constitutional: Positive fever/chills Eyes: No visual changes. ENT: No sore throat. Cardiovascular: Positive chest pain. Respiratory: Positive shortness of breath. Positive cough.  Gastrointestinal: No abdominal pain.  No nausea, no vomiting.  No diarrhea.  No constipation. Genitourinary: Negative for dysuria. Musculoskeletal: Negative for back pain. Skin: Negative for rash. Neurological: Negative for headaches.   ____________________________________________   PHYSICAL EXAM:  VITAL SIGNS: ED Triage Vitals  Enc Vitals Group     BP 05/29/22 1442 115/77     Pulse Rate 05/29/22 1442 87     Resp 05/29/22 1442 20     Temp 05/29/22 1442 98.4 F (36.9 C)     Temp Source 05/29/22 1803 Oral     SpO2 05/29/22 1442 98 %     Weight 05/29/22 1442 130 lb (59 kg)     Height 05/29/22 1442 5' (1.524 m)   Constitutional: Alert and oriented. Well appearing and in no acute distress. Eyes: Conjunctivae are normal.  Head: Atraumatic. Nose: No  congestion/rhinnorhea. Mouth/Throat: Mucous membranes are moist.  Neck: No stridor. Cardiovascular: Normal rate, regular rhythm. Good peripheral circulation. Grossly normal heart sounds.   Respiratory: Normal respiratory effort.  No retractions. Lungs CTAB. Gastrointestinal: Soft and nontender. No distention.  Musculoskeletal: No lower extremity tenderness nor edema. No gross deformities of extremities. Neurologic:  Normal speech and language. No gross focal neurologic deficits are appreciated.  Skin:  Skin is warm, dry and intact. No rash noted.  ____________________________________________   LABS (all labs ordered are listed, but only abnormal results are displayed)  Labs Reviewed  COMPREHENSIVE METABOLIC PANEL - Abnormal; Notable for the following components:      Result Value   Potassium 3.0 (*)    Glucose, Bld 100 (*)    Calcium 8.7 (*)    Albumin 2.8 (*)    All other components within normal limits  CBC WITH DIFFERENTIAL/PLATELET - Abnormal; Notable for the following components:   Platelets 403 (*)    All other components within normal limits  CULTURE, BLOOD (ROUTINE X 2)  CULTURE, BLOOD (ROUTINE X 2)  LIPASE, BLOOD  LACTIC ACID, PLASMA  LACTIC ACID, PLASMA   ____________________________________________  EKG   EKG Interpretation  Date/Time:  Monday May 29 2022 15:23:19 EST Ventricular Rate:  81 PR Interval:  154 QRS Duration: 96 QT Interval:  356 QTC Calculation: 413 R Axis:   104 Text Interpretation: Normal sinus rhythm Rightward axis Minimal voltage criteria for LVH, may be normal variant ( Cornell product ) Borderline ECG When compared with  ECG of 27-May-2022 20:23, PREVIOUS ECG IS PRESENT Similar to 05/27/22 tracing Confirmed by Alona Bene [phone]) on 05/29/2022 6:23:00 PM        ____________________________________________  RADIOLOGY  DG Chest 2 View  Result Date: 05/29/2022 CLINICAL DATA:  Chest pain EXAM: CHEST - 2 VIEW COMPARISON:   05/27/2022, 05/28/2022 FINDINGS: The heart size and mediastinal contours are within normal limits. Persistent dense airspace consolidation within the left upper lobe with increasing adjacent airspace opacity. Similar left basilar atelectasis. Right lung is clear. No pleural effusion or pneumothorax. The visualized skeletal structures are unremarkable. IMPRESSION: Persistent dense airspace consolidation within the left upper lobe with increasing adjacent airspace opacity compatible with worsening pneumonia. Electronically Signed   By: Duanne Guess D.O.   On: 05/29/2022 18:39    ____________________________________________   PROCEDURES  Procedure(s) performed:   Procedures  None  ____________________________________________   INITIAL IMPRESSION / ASSESSMENT AND PLAN / ED COURSE  Pertinent labs & imaging results that were available during my care of the patient were reviewed by me and considered in my medical decision making (see chart for details).   This patient is Presenting for Evaluation of CP, which does require a range of treatment options, and is a complaint that involves a high risk of morbidity and mortality.  The Differential Diagnoses includes but is not exclusive to acute coronary syndrome, aortic dissection, pulmonary embolism, cardiac tamponade, community-acquired pneumonia, pericarditis, musculoskeletal chest wall pain, etc.   Critical Interventions-    Medications  levofloxacin (LEVAQUIN) IVPB 500 mg (has no administration in time range)  ketorolac (TORADOL) 30 MG/ML injection 30 mg (has no administration in time range)  sodium chloride 0.9 % bolus 500 mL (500 mLs Intravenous New Bag/Given 05/29/22 1956)    Reassessment after intervention:   I decided to review pertinent External Data, and in summary patient seen in the ED yesterday with labs including viral panel which was negative and CTA PE which was negative for PE. Positive for PNA.    Clinical Laboratory  Tests Ordered, included leukocytosis has resolved. K+ of 3.   Radiologic Tests Ordered, included ***. I independently interpreted the images and agree with radiology interpretation.   Cardiac Monitor Tracing which shows NSR.   Social Determinants of Health Risk patient is a smoker and has a history of polysubstance abuse.   Consult complete with  Medical Decision Making: Summary: Patient presents to the emergency department with continued cough and chest discomfort.  Found to have pneumonia yesterday.  Overall looks reasonably well-appearing.  No acute distress.  No hypoxemia.  Reevaluation with update and discussion with   ***Considered admission***  Patient's presentation is most consistent with {EM COPA:27473}   Disposition:   ____________________________________________  FINAL CLINICAL IMPRESSION(S) / ED DIAGNOSES  Final diagnoses:  None     NEW OUTPATIENT MEDICATIONS STARTED DURING THIS VISIT:  New Prescriptions   No medications on file    Note:  This document was prepared using Dragon voice recognition software and may include unintentional dictation errors.  Alona Bene, MD, [facility]

## 2022-05-29 NOTE — H&P (Signed)
History and Physical    Patient: Kelly Huang LXB:[id number] DOB: 03-31-1986 DOA: 05/29/2022 DOS: the patient was seen and examined on 05/29/2022 PCP: Patient, No Pcp Per  Patient coming from: Homeless  Chief Complaint:  Chief Complaint  Patient presents with   Cough   HPI: Kelly Huang is a 36 y.o. female with medical history significant of Cocaine abuse, homelessness.  Pt seen in ED yesterday, dx with PNA.  Given levaquin, discharged with script.  Pt unable to fill script, so back into ED today.  Cough and SOB.  No N/V/D.  Review of Systems: As mentioned in the history of present illness. All other systems reviewed and are negative. Past Medical History:  Diagnosis Date   Alcohol use complicating pregnancy 02/18/2022   Asthma    Asthma affecting pregnancy in second trimester 03/01/2022   Cocaine use complicating pregnancy 02/18/2022   Herniated lumbar intervertebral disc    Seizures (HCC)    History reviewed. No pertinent surgical history. Social History:  reports that she has been smoking cigarettes. She has been smoking an average of .25 packs per day. She has never used smokeless tobacco. She reports that she does not currently use alcohol after a past usage of about 1.0 standard drink of alcohol per week. She reports current drug use. Drug: Cocaine.  Allergies  Allergen Reactions   Bactrim [Sulfamethoxazole-Trimethoprim] Other (See Comments)    Patient is not sure of reaction. She stated that she was having dizzy spells.    History reviewed. No pertinent family history.  Prior to Admission medications   Medication Sig Start Date End Date Taking? Authorizing Provider  acetaminophen (TYLENOL) 500 MG tablet Take 2 tablets (1,000 mg total) by mouth every 6 (six) hours as needed for moderate pain or fever (for pain scale < 4). 03/28/22   Anyanwu, Jethro Bastos, MD  albuterol (VENTOLIN HFA) 108 (90 Base) MCG/ACT inhaler Inhale 2 puffs into the lungs every 6 (six) hours  as needed for wheezing or shortness of breath. 04/17/22   Hermina Staggers, MD  cyclobenzaprine (FLEXERIL) 10 MG tablet Take 1 tablet (10 mg total) by mouth 3 (three) times daily as needed for muscle spasms. 04/17/22   Hermina Staggers, MD  ibuprofen (ADVIL) 600 MG tablet Take 1 tablet (600 mg total) by mouth every 6 (six) hours as needed for mild pain or moderate pain. 03/28/22   Anyanwu, Jethro Bastos, MD  levofloxacin (LEVAQUIN) 500 MG tablet Take 1 tablet (500 mg total) by mouth daily. 05/28/22   Darrick Grinder, PA-C  oxyCODONE (OXY IR/ROXICODONE) 5 MG immediate release tablet Take 1 tablet (5 mg total) by mouth every 6 (six) hours as needed for severe pain or breakthrough pain (pain scale 4-7). 03/28/22   Anyanwu, Jethro Bastos, MD  senna-docusate (SENOKOT-S) 8.6-50 MG tablet Take 2 tablets by mouth at bedtime as needed for mild constipation or moderate constipation. 03/28/22   Anyanwu, Jethro Bastos, MD  sertraline (ZOLOFT) 50 MG tablet Take 1 tablet (50 mg total) by mouth daily. 04/17/22   Hermina Staggers, MD    Physical Exam: Vitals:   05/29/22 1442 05/29/22 1803 05/29/22 2137  BP: 115/77 125/72 (!) 98/55  Pulse: 87 93 81  Resp: 20 20 16   Temp: 98.4 F (36.9 C) 98.4 F (36.9 C) 98.2 F (36.8 C)  TempSrc:  Oral Oral  SpO2: 98% 100% 100%  Weight: 59 kg    Height: 5' (1.524 m)     Constitutional: NAD, calm, comfortable Eyes: PERRL,  lids and conjunctivae normal ENMT: Mucous membranes are moist. Posterior pharynx clear of any exudate or lesions.Normal dentition.  Neck: normal, supple, no masses, no thyromegaly Respiratory: Rhonchi and wheezing present Cardiovascular: Regular rate and rhythm, no murmurs / rubs / gallops. No extremity edema. 2+ pedal pulses. No carotid bruits.  Abdomen: no tenderness, no masses palpated. No hepatosplenomegaly. Bowel sounds positive.  Musculoskeletal: no clubbing / cyanosis. No joint deformity upper and lower extremities. Good ROM, no contractures. Normal muscle  tone.  Skin: no rashes, lesions, ulcers. No induration Neurologic: CN 2-12 grossly intact. Sensation intact, DTR normal. Strength 5/5 in all 4.  Psychiatric: Normal judgment and insight. Alert and oriented x 3. Normal mood.   Data Reviewed:    COVID and flu neg CTA chest from yesterday: IMPRESSION: 1. No evidence of a pulmonary embolism. 2. Dense consolidation in the left upper lobe consistent with pneumonia, associated with mediastinal and left hilar lymphadenopathy.  CXR today: IMPRESSION: Persistent dense airspace consolidation within the left upper lobe with increasing adjacent airspace opacity compatible with worsening pneumonia.   Drugs of Abuse     Component Value Date/Time   LABOPIA NONE DETECTED 05/28/2022 0830   COCAINSCRNUR POSITIVE (A) 05/28/2022 0830   LABBENZ NONE DETECTED 05/28/2022 0830   AMPHETMU NONE DETECTED 05/28/2022 0830   THCU NONE DETECTED 05/28/2022 0830   LABBARB NONE DETECTED 05/28/2022 0830    Urinalysis    Component Value Date/Time   COLORURINE AMBER (A) 05/28/2022 0830   APPEARANCEUR HAZY (A) 05/28/2022 0830   LABSPEC >1.046 (H) 05/28/2022 0830   PHURINE 5.0 05/28/2022 0830   GLUCOSEU NEGATIVE 05/28/2022 0830   HGBUR MODERATE (A) 05/28/2022 0830   BILIRUBINUR NEGATIVE 05/28/2022 0830   KETONESUR 5 (A) 05/28/2022 0830   PROTEINUR >=300 (A) 05/28/2022 0830   UROBILINOGEN 1.0 12/28/2014 1030   NITRITE POSITIVE (A) 05/28/2022 0830   LEUKOCYTESUR NEGATIVE 05/28/2022 0830       Latest Ref Rng & Units 05/29/2022    6:24 PM 05/27/2022    6:06 PM 03/26/2022   11:14 AM  CBC  WBC 4.0 - 10.5 K/uL 7.9  23.1  12.6   Hemoglobin 12.0 - 15.0 g/dL 81.8  29.9  37.1   Hematocrit 36.0 - 46.0 % 38.9  41.8  38.0   Platelets 150 - 400 K/uL 403  348  256       Latest Ref Rng & Units 05/29/2022    6:24 PM 05/27/2022    6:06 PM 03/26/2022   11:14 AM  CMP  Glucose 70 - 99 mg/dL 696  789  91   BUN 6 - 20 mg/dL 11  11  6    Creatinine 0.44 - 1.00  mg/dL  3.81  0.17   Sodium 135 - 145 mmol/L 137  136  133   Potassium 3.5 - 5.1 mmol/L 3.0  3.1  3.5   Chloride 98 - 111 mmol/L 99  97  101   CO2 22 - 32 mmol/L 26  24  24    Calcium 8.9 - 10.3 mg/dL 8.7  8.8  8.3   Total Protein 6.5 - 8.1 g/dL 6.9   6.2   Total Bilirubin 0.3 - 1.2 mg/dL 0.7   0.4   Alkaline Phos 38 - 126 U/L 39   69   AST 15 - 41 U/L 28   13   ALT 0 - 44 U/L 21   10    Lactate 1.0  Assessment and Plan: * CAP (community acquired pneumonia)  Satting okay, real reason for admit is due to inability to fill levaquin script. PNA pathway Got levaquin yesterday in ED and again today in ED, putting on rocephin + azithro starting tomorrow. Check urine pregnancy SW consult to get access to meds since that's why they can't DC her from ED.  Homeless Chronic SW consult  Cocaine abuse [facility]) Ongoing      Advance Care Planning:   Code Status: Full Code  Consults: None  Family Communication: No family in room  Severity of Illness: The appropriate patient status for this patient is OBSERVATION. Observation status is judged to be reasonable and necessary in order to provide the required intensity of service to ensure the patient's safety. The patient's presenting symptoms, physical exam findings, and initial radiographic and laboratory data in the context of their medical condition is felt to place them at decreased risk for further clinical deterioration. Furthermore, it is anticipated that the patient will be medically stable for discharge from the hospital within 2 midnights of admission.   Author: Hillary Bow., DO 05/29/2022 10:35 PM  For on call review [email or web address].

## 2022-05-29 NOTE — ED Notes (Signed)
Patient transported to X-ray 

## 2022-05-29 NOTE — Assessment & Plan Note (Addendum)
Satting okay, real reason for admit is due to inability to fill levaquin script. PNA pathway Got levaquin yesterday in ED and again today in ED, putting on rocephin + azithro starting tomorrow. Check urine pregnancy SW consult to get access to meds since that's why they can't DC her from ED.

## 2022-05-29 NOTE — ED Notes (Signed)
Took a lap around the halls, Patient O2 sat did not drop below 97%.

## 2022-05-29 NOTE — Assessment & Plan Note (Signed)
Chronic SW consult

## 2022-05-29 NOTE — Assessment & Plan Note (Signed)
Ongoing

## 2022-05-29 NOTE — ED Provider Triage Note (Signed)
Emergency Medicine Provider Triage Evaluation Note  Kelly Huang , a 36 y.o. female  was evaluated in triage.  Pt complains of worsening chest pain and shortness of breath since Thursday.  Patient was evaluated here on 12/23, diagnosed with pneumonia however patient has not picked up her antibiotics yet.  She states the pain is on the left side of her chest, worse with breathing and cough.  Patient is not febrile in triage.  Review of Systems  Positive: As above Negative: As above  Physical Exam  BP 115/77   Pulse 87   Temp 98.4 F (36.9 C)   Resp 20   Ht 5' (1.524 m)   Wt 59 kg   SpO2 98%   BMI 25.39 kg/m  Gen:   Awake, no distress   Resp:  Normal effort  MSK:   Moves extremities without difficulty  Other:    Medical Decision Making  Medically screening exam initiated at 2:51 PM.  Appropriate orders placed.  [name] was informed that the remainder of the evaluation will be completed by another provider, this initial triage assessment does not replace that evaluation, and the importance of remaining in the ED until their evaluation is complete.    [name], [place] 05/30/22 2146

## 2022-05-29 NOTE — ED Triage Notes (Signed)
Pt states she was diagnosed with pneumonia 2 days ago. States she has not taken her abx. Reports cough and sob has not improved. Pt axox4. Nad.

## 2022-05-30 ENCOUNTER — Other Ambulatory Visit: Payer: Self-pay

## 2022-05-30 ENCOUNTER — Encounter (HOSPITAL_COMMUNITY): Payer: Self-pay | Admitting: Internal Medicine

## 2022-05-30 DIAGNOSIS — R079 Chest pain, unspecified: Secondary | ICD-10-CM | POA: Diagnosis not present

## 2022-05-30 DIAGNOSIS — F1010 Alcohol abuse, uncomplicated: Secondary | ICD-10-CM | POA: Diagnosis present

## 2022-05-30 DIAGNOSIS — Z79899 Other long term (current) drug therapy: Secondary | ICD-10-CM | POA: Diagnosis not present

## 2022-05-30 DIAGNOSIS — J189 Pneumonia, unspecified organism: Secondary | ICD-10-CM | POA: Diagnosis not present

## 2022-05-30 DIAGNOSIS — R0789 Other chest pain: Secondary | ICD-10-CM | POA: Diagnosis not present

## 2022-05-30 DIAGNOSIS — F17210 Nicotine dependence, cigarettes, uncomplicated: Secondary | ICD-10-CM | POA: Diagnosis present

## 2022-05-30 DIAGNOSIS — Z1152 Encounter for screening for COVID-19: Secondary | ICD-10-CM | POA: Diagnosis not present

## 2022-05-30 DIAGNOSIS — J9811 Atelectasis: Secondary | ICD-10-CM | POA: Diagnosis not present

## 2022-05-30 DIAGNOSIS — Z5900 Homelessness unspecified: Secondary | ICD-10-CM | POA: Diagnosis not present

## 2022-05-30 DIAGNOSIS — F1410 Cocaine abuse, uncomplicated: Secondary | ICD-10-CM | POA: Diagnosis present

## 2022-05-30 DIAGNOSIS — E876 Hypokalemia: Secondary | ICD-10-CM | POA: Diagnosis present

## 2022-05-30 LAB — COMPREHENSIVE METABOLIC PANEL
ALT: 22 U/L (ref 0–44)
AST: 28 U/L (ref 15–41)
Albumin: 2.9 g/dL — ABNORMAL LOW (ref 3.5–5.0)
Alkaline Phosphatase: 43 U/L (ref 38–126)
Anion gap: 11 (ref 5–15)
BUN: 13 mg/dL (ref 6–20)
CO2: 26 mmol/L (ref 22–32)
Calcium: 8.8 mg/dL — ABNORMAL LOW (ref 8.9–10.3)
Chloride: 102 mmol/L (ref 98–111)
Creatinine, Ser: 0.69 mg/dL (ref 0.44–1.00)
GFR, Estimated: >60 mL/min (ref >60)
Glucose, Bld: 91 mg/dL (ref 70–99)
Potassium: 3.2 mmol/L — ABNORMAL LOW (ref 3.5–5.1)
Sodium: 139 mmol/L (ref 135–145)
Total Bilirubin: 0.4 mg/dL (ref 0.3–1.2)
Total Protein: 6.9 g/dL (ref 6.5–8.1)

## 2022-05-30 LAB — CBC
HCT: 38.6 % (ref 36.0–46.0)
Hemoglobin: 12.5 g/dL (ref 12.0–15.0)
MCH: 28.9 pg (ref 26.0–34.0)
MCHC: 32.4 g/dL (ref 30.0–36.0)
MCV: 89.1 fL (ref 80.0–100.0)
Platelets: 387 10*3/uL (ref 150–400)
RBC: 4.33 MIL/uL (ref 3.87–5.11)
RDW: 15 % (ref 11.5–15.5)
WBC: 6.2 10*3/uL (ref 4.0–10.5)
nRBC: 0 % (ref 0.0–0.2)

## 2022-05-30 LAB — I-STAT BETA HCG BLOOD, ED (MC, WL, AP ONLY): I-stat hCG, quantitative: <5 m[IU]/mL (ref <5)

## 2022-05-30 LAB — PROCALCITONIN: Procalcitonin: 1.77 ng/mL

## 2022-05-30 LAB — PREGNANCY, URINE: Preg Test, Ur: NEGATIVE

## 2022-05-30 LAB — C-REACTIVE PROTEIN: CRP: 18.2 mg/dL — ABNORMAL HIGH (ref <1.0)

## 2022-05-30 MED ORDER — SODIUM CHLORIDE 0.9 % IV BOLUS
500.0000 mL | Freq: Once | INTRAVENOUS | Status: AC
  Administered 2022-05-30: 500 mL via INTRAVENOUS

## 2022-05-30 MED ORDER — POTASSIUM CHLORIDE CRYS ER 20 MEQ PO TBCR
40.0000 meq | EXTENDED_RELEASE_TABLET | Freq: Once | ORAL | Status: AC
  Administered 2022-05-30: 40 meq via ORAL
  Filled 2022-05-30 (×2): qty 2

## 2022-05-30 NOTE — Progress Notes (Signed)
PROGRESS NOTE                                                                                                                                                                                                             Kelly Huang Demographics:    Kelly Huang, is a 36 y.o. female, DOB - 18-Feb-1986, XHF:[id number]  Outpatient Primary MD for the Kelly Huang is Kelly Huang, No Pcp Per    LOS - 0  Admit date - 05/29/2022    Chief Complaint  Kelly Huang presents with   Cough       Brief Narrative (HPI from H&P)    36 y.o. female with medical history significant of Cocaine abuse, homelessness.Pt seen in ED yesterday, dx with PNA CT scan, had presented with cough and shortness of breath, she was discharged home could not get her prescriptions filled had nowhere to sleep as she is homeless cough shortness of breath got worse she came back to the ER.  Was admitted for pneumonia treatment.   Subjective:    Festus Huang today has, No headache, No chest pain, No abdominal pain - No Nausea, No new weakness tingling or numbness, +ve productive cough   Assessment  & Plan :    CAP.  Homeless person, could not get prescriptions filled, got worse sleeping in the cold.  Keep her in the hospital IV antibiotics, procalcitonin and CRP are elevated and CT has evidence of dense consolidation.  Encouraged to sit in chair use I-S and flutter valve, have requested social work to see if we have any better disposition for her i.e. shelter etc.  She tells me that if we send her out today she is going to come right back as she cannot survive in this cold with pneumonia.   Cocaine abuse.  Counseled to quit.  Hypokalemia.  Replaced.  Will monitor.      Condition - Fair  Family Communication  :  None  Code Status :  Full  Consults  :  None  PUD Prophylaxis :    Procedures  :     CT -  1. No evidence of a pulmonary embolism. 2. Dense consolidation in  the left upper lobe consistent with pneumonia, associated with mediastinal and left hilar lymphadenopathy.       Disposition Plan  :    Status is: Observation  DVT Prophylaxis  :    enoxaparin (LOVENOX) injection 40 mg Start: 05/30/22 1000   Lab Results  Component Value Date   PLT 387 05/30/2022    Diet :  Diet Order             Diet Heart Room service appropriate? Yes; Fluid consistency: Thin  Diet effective now                    Inpatient Medications  Scheduled Meds:  enoxaparin (LOVENOX) injection  40 mg Subcutaneous Q24H   Continuous Infusions:  azithromycin     cefTRIAXone (ROCEPHIN)  IV 2 g (05/30/22 1024)   PRN Meds:.acetaminophen **OR** acetaminophen, ondansetron **OR** ondansetron (ZOFRAN) IV    Objective:   Vitals:   05/29/22 2351 05/30/22 0140 05/30/22 0528 05/30/22 0722  BP:  100/64 102/66 108/68  Pulse:  68 68 67  Resp:  20 18 16   Temp: 97.8 F (36.6 C) 97.9 F (36.6 C) (!) 97.5 F (36.4 C) 97.6 F (36.4 C)  TempSrc: Oral Oral Oral Oral  SpO2:  100% 100% 100%  Weight:      Height:        Wt Readings from Last 3 Encounters:  05/29/22 59 kg  04/17/22 53.1 kg  03/26/22 52.5 kg     Intake/Output Summary (Last 24 hours) at 05/30/2022 1036 Last data filed at 05/29/2022 2136 Gross per 24 hour  Intake 600 ml  Output --  Net 600 ml     Physical Exam  Awake Alert, No new F.N deficits, Normal affect [place].AT,PERRAL Supple Neck, No JVD,   Symmetrical Chest wall movement, Good air movement bilaterally, CTAB RRR,No Gallops,Rubs or new Murmurs,  +ve B.Sounds, Abd Soft, No tenderness,   No Cyanosis, Clubbing or edema      Data Review:    Recent Labs  Lab 05/27/22 1806 05/29/22 1824 05/30/22 0346  WBC 23.1* 7.9 6.2  HGB 13.5 12.3 12.5  HCT 41.8 38.9 38.6  PLT 348 403* 387  MCV 88.6 88.8 89.1  MCH 28.6 28.1 28.9  MCHC 32.3 31.6 32.4  RDW 15.6* 15.2 15.0  LYMPHSABS  --  1.7  --   MONOABS  --  0.8  --   EOSABS  --  0.1   --   BASOSABS  --  0.0  --     Recent Labs  Lab 05/27/22 1806 05/29/22 1824 05/29/22 2008 05/30/22 0346  NA 136 137  --  139  K 3.1* 3.0*  --  3.2*  CL 97* 99  --  102  CO2 24 26  --  26  ANIONGAP 15 12  --  11  GLUCOSE 107* 100*  --  91  BUN 11 11  --  13  CREATININE 0.98 0.72  --  0.69  AST  --  28  --  28  ALT  --  21  --  22  ALKPHOS  --  39  --  43  BILITOT  --  0.7  --  0.4  ALBUMIN  --  2.8*  --  2.9*  CRP  --   --   --  18.2*  PROCALCITON  --   --   --  1.77  LATICACIDVEN  --  1.0 1.0  --   CALCIUM 8.8* 8.7*  --  8.8*    Radiology Reports DG Chest 2 View  Result Date: 05/29/2022 CLINICAL DATA:  Chest pain EXAM: CHEST - 2 VIEW COMPARISON:  05/27/2022, 05/28/2022  FINDINGS: The heart size and mediastinal contours are within normal limits. Persistent dense airspace consolidation within the left upper lobe with increasing adjacent airspace opacity. Similar left basilar atelectasis. Right lung is clear. No pleural effusion or pneumothorax. The visualized skeletal structures are unremarkable. IMPRESSION: Persistent dense airspace consolidation within the left upper lobe with increasing adjacent airspace opacity compatible with worsening pneumonia. Electronically Signed   By: Duanne Guess D.O.   On: 05/29/2022 18:39   CT Angio Chest Pulmonary Embolism (PE) W or WO Contrast  Result Date: 05/28/2022 CLINICAL DATA:  Chest pain since last night. Short of breath. Cough. EXAM: CT ANGIOGRAPHY CHEST WITH CONTRAST TECHNIQUE: Multidetector CT imaging of the chest was performed using the standard protocol during bolus administration of intravenous contrast. Multiplanar CT image reconstructions and MIPs were obtained to evaluate the vascular anatomy. RADIATION DOSE REDUCTION: This exam was performed according to the departmental dose-optimization program which includes automated exposure control, adjustment of the mA and/or kV according to Kelly Huang size and/or use of iterative  reconstruction technique. CONTRAST:  76mL OMNIPAQUE IOHEXOL 350 MG/ML SOLN COMPARISON:  Chest radiograph, 05/27/2022. FINDINGS: Cardiovascular: Away arteries are well opacified. There is no evidence of a pulmonary embolism. Heart top-normal in size. No pericardial effusion. Aorta normal in caliber. No dissection or atherosclerosis. Enlarged main pulmonary artery measuring 3.2 cm. Mediastinum/Nodes: No neck base mass. No enlarged axillary lymph nodes. Prominent and enlarged shoddy mediastinal lymph nodes. Largest is a prevascular node, to the left of the aortic arch, 1.7 cm in short axis. Mildly enlarged left hilar lymph nodes measuring up to 1.3 cm in short axis. Trachea and esophagus are unremarkable. Lungs/Pleura: Dense consolidation in the left upper lobe. Linear and patchy opacity noted at the base of the left lower lobe consistent with atelectasis. Remainder of the lungs is clear. No pleural effusion. No pneumothorax. Upper Abdomen: Unremarkable. Musculoskeletal: No fracture or acute finding. No bone lesion. No chest wall mass. Review of the MIP images confirms the above findings. IMPRESSION: 1. No evidence of a pulmonary embolism. 2. Dense consolidation in the left upper lobe consistent with pneumonia, associated with mediastinal and left hilar lymphadenopathy. Electronically Signed   By: Amie Portland M.D.   On: 05/28/2022 10:46   DG Chest 2 View  Result Date: 05/27/2022 CLINICAL DATA:  Cough, dyspnea EXAM: CHEST - 2 VIEW COMPARISON:  01/09/2018 FINDINGS: There is dense consolidation within the lateral left upper lobe, possibly reflecting changes of acute lobar pneumonia in the appropriate clinical setting. Mild left basilar atelectasis and left-sided volume loss. Right lung is clear. No pneumothorax or pleural effusion. Cardiac size within normal limits. Pulmonary vascularity is normal. No acute bone abnormality. IMPRESSION: 1. Left upper lobe pneumonia. Follow-up chest radiograph in 3-4 weeks is  recommended to document complete resolution. Electronically Signed   By: Helyn Numbers M.D.   On: 05/27/2022 17:40      Signature  -   Susa Raring M.D on 05/30/2022 at 10:36 AM   -  To page go to [email or web address]

## 2022-05-30 NOTE — ED Notes (Signed)
Patient transported to inpatient floor with tech at this time in NAD, stable at time of transport.

## 2022-05-30 NOTE — Progress Notes (Signed)
CSW spoke with patient to discuss discharge planning. Patient reports she has been staying at "different places" recently due to not having permanent housing at this time. Patient reports she is agreeable to receiving her medications from [facility] pharmacy prior to discharge back into the community. Patient requesting to stay one more night in the hospital as she does not feel she is ready to be discharged at this time. Patient reports she has housing in [place] and CSW offered to attempt to assist with transportation back there but patient did not seem interested. Patient states she is aware of the resources in the community that assist with housing.  CSW added contact information for [place]'s Coordinated Entry program to patient's AVS.  CSW informed MD of information.  Edwin Dada, MSW, LCSW Transitions of Care  Clinical Social Worker II [phone]

## 2022-05-30 NOTE — Discharge Instructions (Signed)
[facility] Coordinated Entry [employer]: [employer] every Tuesday from 9am-1am or [employer] in [place] every Wednesday from 8am-9am.  [phone]

## 2022-05-31 DIAGNOSIS — J189 Pneumonia, unspecified organism: Secondary | ICD-10-CM | POA: Diagnosis not present

## 2022-05-31 LAB — CBC WITH DIFFERENTIAL/PLATELET
Abs Immature Granulocytes: 0.11 10*3/uL — ABNORMAL HIGH (ref 0.00–0.07)
Basophils Absolute: 0.1 10*3/uL (ref 0.0–0.1)
Basophils Relative: 1 %
Eosinophils Absolute: 0.1 10*3/uL (ref 0.0–0.5)
Eosinophils Relative: 1 %
HCT: 37.9 % (ref 36.0–46.0)
Hemoglobin: 12 g/dL (ref 12.0–15.0)
Immature Granulocytes: 2 %
Lymphocytes Relative: 28 %
Lymphs Abs: 2 10*3/uL (ref 0.7–4.0)
MCH: 27.9 pg (ref 26.0–34.0)
MCHC: 31.7 g/dL (ref 30.0–36.0)
MCV: 88.1 fL (ref 80.0–100.0)
Monocytes Absolute: 0.6 10*3/uL (ref 0.1–1.0)
Monocytes Relative: 8 %
Neutro Abs: 4.4 10*3/uL (ref 1.7–7.7)
Neutrophils Relative %: 60 %
Platelets: 441 10*3/uL — ABNORMAL HIGH (ref 150–400)
RBC: 4.3 MIL/uL (ref 3.87–5.11)
RDW: 15 % (ref 11.5–15.5)
WBC: 7.2 10*3/uL (ref 4.0–10.5)
nRBC: 0 % (ref 0.0–0.2)

## 2022-05-31 LAB — PROCALCITONIN: Procalcitonin: 0.74 ng/mL

## 2022-05-31 LAB — BASIC METABOLIC PANEL
Anion gap: 7 (ref 5–15)
BUN: 6 mg/dL (ref 6–20)
CO2: 22 mmol/L (ref 22–32)
Calcium: 8.1 mg/dL — ABNORMAL LOW (ref 8.9–10.3)
Chloride: 110 mmol/L (ref 98–111)
Creatinine, Ser: 0.58 mg/dL (ref 0.44–1.00)
GFR, Estimated: >60 mL/min (ref >60)
Glucose, Bld: 101 mg/dL — ABNORMAL HIGH (ref 70–99)
Potassium: 3.4 mmol/L — ABNORMAL LOW (ref 3.5–5.1)
Sodium: 139 mmol/L (ref 135–145)

## 2022-05-31 LAB — MAGNESIUM: Magnesium: 1.8 mg/dL (ref 1.7–2.4)

## 2022-05-31 LAB — C-REACTIVE PROTEIN: CRP: 8.6 mg/dL — ABNORMAL HIGH (ref <1.0)

## 2022-05-31 LAB — BRAIN NATRIURETIC PEPTIDE: B Natriuretic Peptide: 49.8 pg/mL (ref 0.0–100.0)

## 2022-05-31 MED ORDER — POTASSIUM CHLORIDE CRYS ER 20 MEQ PO TBCR
40.0000 meq | EXTENDED_RELEASE_TABLET | Freq: Once | ORAL | Status: AC
  Administered 2022-05-31: 40 meq via ORAL
  Filled 2022-05-31: qty 2

## 2022-05-31 NOTE — Progress Notes (Signed)
PROGRESS NOTE  Kelly Huang  DOB: 10-Nov-1985  PCP: Patient, No Pcp Per [id number]  DOA: 05/29/2022  LOS: 1 day  Hospital Day: 3  Brief narrative: Kelly Huang is a 36 y.o. female with PMH significant for homelessness, polysubstance abuse including cocaine, alcohol. 12/24, patient was seen in the ED for cough, shortness of breath.  CT chest showed left upper lobe pneumonia.  She was discharged on antibiotics which she was not able to pick up.  Symptoms worsened and hence she returned to ED next day on 12/25. Repeat chest x-ray showed persistent dense airspace consolidation in the left upper lobe consistent with worsening pneumonia. Started on IV antibiotics, admitted to [facility].  Subjective: Patient was seen and examined this afternoon. Lying down in bed.  Coughing.  Bronchial breath sound bilaterally heard. Chart reviewed In the last 24 hours, patient remains afebrile, hemodynamically stable, breathing on room air  Assessment and plan: Left upper lobe pneumonia Presented with cough, shortness of breath.  Imaging findings as above showing left upper lobe dense consolidation.  WC count normal.  Procalcitonin level was elevated. Currently on IV ceftriaxone and IV azithromycin Not requiring supplemental oxygen at rest.  Check ambulatory oxygen requirement Mucinex, flutter valve, incentive spirometry Recent Labs  Lab 05/27/22 1806 05/29/22 1824 05/29/22 2008 05/30/22 0346 05/31/22 0417  WBC 23.1* 7.9  --  6.2 7.2  LATICACIDVEN  --  1.0 1.0  --   --   PROCALCITON  --   --   --  1.77 0.74   Hypokalemia Potassium low at 3.4 this morning.  Replacement ordered. Recent Labs  Lab 05/27/22 1806 05/29/22 1824 05/30/22 0346 05/31/22 0417  K 3.1* 3.0* 3.2* 3.4*  MG  --   --   --  1.8   Polysubstance abuse including cocaine, alcohol, tobacco  Counseled to quit.  Currently not showing signs of withdrawal.  Homelessness Patient reports he has been staying at different places  because of not having permanent housing.  TOC consulted.  Shelter information given.  Goals of care   Code Status: Full Code    Mobility: Encourage ambulation  Scheduled Meds:  enoxaparin (LOVENOX) injection  40 mg Subcutaneous Q24H    PRN meds: acetaminophen **OR** acetaminophen, ondansetron **OR** ondansetron (ZOFRAN) IV   Infusions:   azithromycin 500 mg (05/30/22 2126)   cefTRIAXone (ROCEPHIN)  IV 2 g (05/31/22 1002)    Skin assessment:     Nutritional status:  Body mass index is 25.39 kg/m.          Diet:  Diet Order             Diet Heart Room service appropriate? Yes; Fluid consistency: Thin  Diet effective now                   DVT prophylaxis:  enoxaparin (LOVENOX) injection 40 mg Start: 05/30/22 1000   Antimicrobials: Rocephin azithromycin Fluid: None Consultants: None Family Communication: None at bedside  Status is: Inpatient  Continue in-hospital care because: Remains short of breath, symptomatic from pneumonia Level of care: Med-Surg   Dispo: The patient is from: Homeless              Anticipated d/c is to: Homeless shelter              Patient currently is not medically stable to d/c.   Difficult to place patient No    Antimicrobials: Anti-infectives (From admission, onward)    Start     Dose/Rate Route Frequency  Ordered Stop   05/30/22 2200  azithromycin (ZITHROMAX) 500 mg in sodium chloride 0.9 % 250 mL IVPB        500 mg 250 mL/hr over 60 Minutes Intravenous Every 24 hours 05/29/22 2231 06/04/22 2159   05/30/22 1000  cefTRIAXone (ROCEPHIN) 2 g in sodium chloride 0.9 % 100 mL IVPB        2 g 200 mL/hr over 30 Minutes Intravenous Every 24 hours 05/29/22 2231 06/04/22 0959   05/29/22 1815  levofloxacin (LEVAQUIN) IVPB 500 mg        500 mg 100 mL/hr over 60 Minutes Intravenous  Once 05/29/22 1808 05/29/22 2136       Objective: Vitals:   05/31/22 0749 05/31/22 1605  BP: 107/65 102/67  Pulse: 70 68  Resp: 19 19  Temp:  98.2 F (36.8 C) 98.1 F (36.7 C)  SpO2: 99% 98%    Intake/Output Summary (Last 24 hours) at 05/31/2022 1659 Last data filed at 05/31/2022 0300 Gross per 24 hour  Intake 250 ml  Output --  Net 250 ml   Filed Weights   05/29/22 1442  Weight: 59 kg   Weight change:  Body mass index is 25.39 kg/m.   Physical Exam: General exam: Young African-American female. Skin: No rashes, lesions or ulcers. HEENT: Atraumatic, normocephalic, no obvious bleeding Lungs: Scattered bronchial sound bilaterally, coughs on deep breathing CVS: Regular rate and rhythm, no murmur GI/Abd soft, nontender, nondistended, bowel sound. CNS: Alert, awake, oriented x 3 Psychiatry: Mood appropriate Extremities: No pedal edema, no calf tenderness  Data Review: I have personally reviewed the laboratory data and studies available.  F/u labs ordered Unresulted Labs (From admission, onward)     Start     Ordered   05/31/22 0500  CBC with Differential/Platelet  Daily,   R     Question:  Specimen collection method  Answer:  Lab=Lab collect   05/30/22 0547   05/31/22 0500  Brain natriuretic peptide  Daily,   R     Question:  Specimen collection method  Answer:  Lab=Lab collect   05/30/22 0547   05/31/22 0500  Basic metabolic panel  Daily,   R     Question:  Specimen collection method  Answer:  Lab=Lab collect   05/30/22 0547   05/31/22 0500  Magnesium  Daily,   R     Question:  Specimen collection method  Answer:  Lab=Lab collect   05/30/22 0547   05/31/22 0500  C-reactive protein  Daily,   R     Question:  Specimen collection method  Answer:  Lab=Lab collect   05/30/22 0547   05/31/22 0500  Procalcitonin  Daily,   R      05/30/22 0547            Total time spent in review of labs and imaging, patient evaluation, formulation of plan, documentation and communication with family -75 minutes  Signed, Lorin Glass, MD [facility] 05/31/2022

## 2022-05-31 NOTE — Progress Notes (Signed)
CSW spoke with MD to discuss patient - per MD patient will be discharged tomorrow.  CSW will attempt to locate a coat or jacket for patient.  Edwin Dada, MSW, LCSW Transitions of Care  Clinical Social Worker II [phone]

## 2022-06-01 ENCOUNTER — Other Ambulatory Visit (HOSPITAL_COMMUNITY): Payer: Self-pay

## 2022-06-01 DIAGNOSIS — J189 Pneumonia, unspecified organism: Secondary | ICD-10-CM | POA: Diagnosis not present

## 2022-06-01 LAB — CBC WITH DIFFERENTIAL/PLATELET
Abs Immature Granulocytes: 0.23 10*3/uL — ABNORMAL HIGH (ref 0.00–0.07)
Basophils Absolute: 0.1 10*3/uL (ref 0.0–0.1)
Basophils Relative: 1 %
Eosinophils Absolute: 0.1 10*3/uL (ref 0.0–0.5)
Eosinophils Relative: 1 %
HCT: 37.7 % (ref 36.0–46.0)
Hemoglobin: 12.3 g/dL (ref 12.0–15.0)
Immature Granulocytes: 2 %
Lymphocytes Relative: 16 %
Lymphs Abs: 1.6 10*3/uL (ref 0.7–4.0)
MCH: 28.4 pg (ref 26.0–34.0)
MCHC: 32.6 g/dL (ref 30.0–36.0)
MCV: 87.1 fL (ref 80.0–100.0)
Monocytes Absolute: 0.7 10*3/uL (ref 0.1–1.0)
Monocytes Relative: 7 %
Neutro Abs: 7.7 10*3/uL (ref 1.7–7.7)
Neutrophils Relative %: 73 %
Platelets: 539 10*3/uL — ABNORMAL HIGH (ref 150–400)
RBC: 4.33 MIL/uL (ref 3.87–5.11)
RDW: 14.7 % (ref 11.5–15.5)
WBC: 10.3 10*3/uL (ref 4.0–10.5)
nRBC: 0 % (ref 0.0–0.2)

## 2022-06-01 LAB — PROCALCITONIN: Procalcitonin: 0.32 ng/mL

## 2022-06-01 LAB — BASIC METABOLIC PANEL
Anion gap: 9 (ref 5–15)
BUN: 7 mg/dL (ref 6–20)
CO2: 23 mmol/L (ref 22–32)
Calcium: 8.8 mg/dL — ABNORMAL LOW (ref 8.9–10.3)
Chloride: 107 mmol/L (ref 98–111)
Creatinine, Ser: 0.62 mg/dL (ref 0.44–1.00)
GFR, Estimated: >60 mL/min (ref >60)
Glucose, Bld: 110 mg/dL — ABNORMAL HIGH (ref 70–99)
Potassium: 4 mmol/L (ref 3.5–5.1)
Sodium: 139 mmol/L (ref 135–145)

## 2022-06-01 LAB — MAGNESIUM: Magnesium: 1.9 mg/dL (ref 1.7–2.4)

## 2022-06-01 LAB — C-REACTIVE PROTEIN: CRP: 5.3 mg/dL — ABNORMAL HIGH (ref <1.0)

## 2022-06-01 LAB — BRAIN NATRIURETIC PEPTIDE: B Natriuretic Peptide: 32.3 pg/mL (ref 0.0–100.0)

## 2022-06-01 MED ORDER — ALBUTEROL SULFATE HFA 108 (90 BASE) MCG/ACT IN AERS
2.0000 | INHALATION_SPRAY | Freq: Four times a day (QID) | RESPIRATORY_TRACT | 2 refills | Status: DC | PRN
  Filled 2022-06-01: qty 18, 30d supply, fill #0

## 2022-06-01 MED ORDER — AMOXICILLIN-POT CLAVULANATE 875-125 MG PO TABS
1.0000 | ORAL_TABLET | Freq: Two times a day (BID) | ORAL | 0 refills | Status: AC
  Filled 2022-06-01: qty 14, 7d supply, fill #0

## 2022-06-01 MED ORDER — AMOXICILLIN-POT CLAVULANATE 875-125 MG PO TABS
1.0000 | ORAL_TABLET | Freq: Two times a day (BID) | ORAL | Status: DC
  Administered 2022-06-01: 1 via ORAL
  Filled 2022-06-01: qty 1

## 2022-06-01 NOTE — Discharge Summary (Signed)
Physician Discharge Summary  Kelly Huang XTK:[id number] DOB: January 24, 1986 DOA: 05/29/2022  PCP: Patient, No Pcp Per  Admit date: 05/29/2022 Discharge date: 06/01/2022  Admitted From: Homeless Discharge disposition: Homeless shelter  Recommendations at discharge:  Complete course of antibiotics as advised. Stop smoking, drug abuse  Brief narrative: Kelly Huang is a 36 y.o. female with PMH significant for homelessness, polysubstance abuse including cocaine, alcohol. 12/24, patient was seen in the ED for cough, shortness of breath.  CT chest showed left upper lobe pneumonia.  She was discharged on antibiotics which she was not able to pick up.  Symptoms worsened and hence she returned to ED next day on 12/25. Repeat chest x-ray showed persistent dense airspace consolidation in the left upper lobe consistent with worsening pneumonia. Started on IV antibiotics, admitted to [facility].  Subjective: Patient was seen and examined this morning.  Sitting up in bed.  Not in distress.  Feels better and ready for discharge.  Hospital course: Left upper lobe pneumonia Presented with cough, shortness of breath.  Imaging findings as above showing left upper lobe dense consolidation.  WBC count normal.  Procalcitonin level was elevated. Currently on IV ceftriaxone and IV azithromycin Feels better. Not requiring supplemental oxygen at rest.  Check ambulatory oxygen requirement Discharge on 7 more days of Augmentin. Continue Mucinex, flutter valve, incentive spirometry Recent Labs  Lab 05/27/22 1806 05/29/22 1824 05/29/22 2008 05/30/22 0346 05/31/22 0417 06/01/22 0309  WBC 23.1* 7.9  --  6.2 7.2 10.3  LATICACIDVEN  --  1.0 1.0  --   --   --   PROCALCITON  --   --   --  1.77 0.74 0.32   Hypokalemia Potassium level improved with replacement. Recent Labs  Lab 05/27/22 1806 05/29/22 1824 05/30/22 0346 05/31/22 0417 06/01/22 0309  K 3.1* 3.0* 3.2* 3.4* 4.0  MG  --   --   --  1.8 1.9    Polysubstance abuse including cocaine, alcohol, tobacco  Counseled to quit.  Currently not showing signs of withdrawal.  Homelessness Patient reports he has been staying at different places because of not having permanent housing.  TOC consulted.  Shelter information given.  Wounds:  -    Discharge Exam:   Vitals:   05/31/22 1605 05/31/22 2050 06/01/22 0501 06/01/22 0726  BP: 102/67 108/88 109/64 112/67  Pulse: 68 91 72 69  Resp: 19 18 18 18   Temp: 98.1 F (36.7 C) 98.2 F (36.8 C) 98.3 F (36.8 C) 98.7 F (37.1 C)  TempSrc: Oral Oral Oral Oral  SpO2: 98% 100% 100% 100%  Weight:      Height:        Body mass index is 25.39 kg/m.   General exam: Young African-American female. Skin: No rashes, lesions or ulcers. HEENT: Atraumatic, normocephalic, no obvious bleeding Lungs: Clear to auscultation bilaterally today CVS: Regular rate and rhythm, no murmur GI/Abd soft, nontender, nondistended, bowel sound. CNS: Alert, awake, oriented x 3 Psychiatry: Mood appropriate Extremities: No pedal edema, no calf tenderness  Follow ups:    Follow-up Information     [facility] Follow up.   Contact information: [address] [place] [phone]                Discharge Instructions:   Discharge Instructions     Call MD for:  difficulty breathing, headache or visual disturbances   Complete by: As directed    Call MD for:  extreme fatigue   Complete by: As directed    Call MD for:  hives   Complete by: As directed    Call MD for:  persistant dizziness or light-headedness   Complete by: As directed    Call MD for:  persistant nausea and vomiting   Complete by: As directed    Call MD for:  severe uncontrolled pain   Complete by: As directed    Call MD for:  temperature >100.4   Complete by: As directed    Diet general   Complete by: As directed    Discharge instructions   Complete  by: As directed    Recommendations at discharge:   Complete course of antibiotics as advised.  Stop smoking, drug abuse  General discharge instructions: Follow with Primary MD Patient, No Pcp Per in 7 days  Please request your PCP  to go over your hospital tests, procedures, radiology results at the follow up. Please get your medicines reviewed and adjusted.  Your PCP may decide to repeat certain labs or tests as needed. Do not drive, operate heavy machinery, perform activities at heights, swimming or participation in water activities or provide baby sitting services if your were admitted for syncope or siezures until you have seen by Primary MD or a Neurologist and advised to do so again. [place] Controlled Substance Reporting System database was reviewed. Do not drive, operate heavy machinery, perform activities at heights, swim, participate in water activities or provide baby-sitting services while on medications for pain, sleep and mood until your outpatient physician has reevaluated you and advised to do so again.  You are strongly recommended to comply with the dose, frequency and duration of prescribed medications. Activity: As tolerated with Full fall precautions use walker/cane & assistance as needed Avoid using any recreational substances like cigarette, tobacco, alcohol, or non-prescribed drug. If you experience worsening of your admission symptoms, develop shortness of breath, life threatening emergency, suicidal or homicidal thoughts you must seek medical attention immediately by calling 911 or calling your MD immediately  if symptoms less severe. You must read complete instructions/literature along with all the possible adverse reactions/side effects for all the medicines you take and that have been prescribed to you. Take any new medicine only after you have completely understood and accepted all the possible adverse reactions/side effects.  Wear Seat belts while driving. You  were cared for by a hospitalist during your hospital stay. If you have any questions about your discharge medications or the care you received while you were in the hospital after you are discharged, you can call the unit and ask to speak with the hospitalist or the covering physician. Once you are discharged, your primary care physician will handle any further medical issues. Please note that NO REFILLS for any discharge medications will be authorized once you are discharged, as it is imperative that you return to your primary care physician (or establish a relationship with a primary care physician if you do not have one).   Increase activity slowly   Complete by: As directed        Discharge Medications:   Allergies as of 06/01/2022       Reactions   Bactrim [sulfamethoxazole-trimethoprim] Other (See Comments)   Patient is not sure of reaction. She stated that she was having dizzy spells.        Medication List     STOP taking these medications    Acetaminophen Extra Strength 500 MG Tabs Commonly known as:  TYLENOL   cyclobenzaprine 10 MG tablet Commonly known as: FLEXERIL   ibuprofen 600 MG tablet Commonly known as: ADVIL   oxyCODONE 5 MG immediate release tablet Commonly known as: Oxy IR/ROXICODONE   Senexon-S 8.6-50 MG tablet Generic drug: senna-docusate   sertraline 50 MG tablet Commonly known as: Zoloft       TAKE these medications    amoxicillin-clavulanate 875-125 MG tablet Commonly known as: AUGMENTIN Take 1 tablet by mouth every 12 (twelve) hours for 7 days.   levofloxacin 500 MG tablet Commonly known as: LEVAQUIN Take 1 tablet (500 mg total) by mouth daily.   Ventolin HFA 108 (90 Base) MCG/ACT inhaler Generic drug: albuterol Inhale 2 puffs into the lungs every 6 (six) hours as needed for wheezing or shortness of breath.         The results of significant diagnostics from this hospitalization (including imaging, microbiology, ancillary and  laboratory) are listed below for reference.    Procedures and Diagnostic Studies:   DG Chest 2 View  Result Date: 05/29/2022 CLINICAL DATA:  Chest pain EXAM: CHEST - 2 VIEW COMPARISON:  05/27/2022, 05/28/2022 FINDINGS: The heart size and mediastinal contours are within normal limits. Persistent dense airspace consolidation within the left upper lobe with increasing adjacent airspace opacity. Similar left basilar atelectasis. Right lung is clear. No pleural effusion or pneumothorax. The visualized skeletal structures are unremarkable. IMPRESSION: Persistent dense airspace consolidation within the left upper lobe with increasing adjacent airspace opacity compatible with worsening pneumonia. Electronically Signed   By: Duanne Guess D.O.   On: 05/29/2022 18:39     Labs:   Basic Metabolic Panel: Recent Labs  Lab 05/27/22 1806 05/29/22 1824 05/30/22 0346 05/31/22 0417 06/01/22 0309  NA 136 137 139 139 139  K 3.1* 3.0* 3.2* 3.4* 4.0  CL 97* 99 102 110 107  CO2 24 26 26 22 23   GLUCOSE 107* 100* 91 101* 110*  BUN 11 11 13 6 7   CREATININE 0.98 0.72 0.69 0.58 0.62  CALCIUM 8.8* 8.7* 8.8* 8.1* 8.8*  MG  --   --   --  1.8 1.9   GFR Estimated Creatinine Clearance: 78.1 mL/min (by C-G formula based on SCr of 0.62 mg/dL). Liver Function Tests: Recent Labs  Lab 05/29/22 1824 05/30/22 0346  AST 28 28  ALT 21 22  ALKPHOS 39 43  BILITOT 0.7 0.4  PROT 6.9 6.9  ALBUMIN 2.8* 2.9*   Recent Labs  Lab 05/29/22 1824  LIPASE 23   No results for input(s): "AMMONIA" in the last 168 hours. Coagulation profile No results for input(s): "INR", "PROTIME" in the last 168 hours.  CBC: Recent Labs  Lab 05/27/22 1806 05/29/22 1824 05/30/22 0346 05/31/22 0417 06/01/22 0309  WBC 23.1* 7.9 6.2 7.2 10.3  NEUTROABS  --  5.2  --  4.4 7.7  HGB 13.5 12.3 12.5 12.0 12.3  HCT 41.8 38.9 38.6 37.9 37.7  MCV 88.6 88.8 89.1 88.1 87.1  PLT 348 403* 387 441* 539*   Cardiac Enzymes: No results for  input(s): "CKTOTAL", "CKMB", "CKMBINDEX", "TROPONINI" in the last 168 hours. BNP: Invalid input(s): "POCBNP" CBG: No results for input(s): "GLUCAP" in the last 168 hours. D-Dimer No results for input(s): "DDIMER" in the last 72 hours. Hgb A1c No results for input(s): "HGBA1C" in the last 72 hours. Lipid Profile No results for input(s): "CHOL", "HDL", "LDLCALC", "TRIG", "CHOLHDL", "LDLDIRECT" in the last 72 hours. Thyroid function studies No results for input(s): "TSH", "T4TOTAL", "T3FREE", "THYROIDAB" in the last  72 hours.  Invalid input(s): "FREET3" Anemia work up No results for input(s): "VITAMINB12", "FOLATE", "FERRITIN", "TIBC", "IRON", "RETICCTPCT" in the last 72 hours. Microbiology Recent Results (from the past 240 hour(s))  Resp panel by RT-PCR (RSV, Flu A&B, Covid) Anterior Nasal Swab     Status: None   Collection Time: 05/27/22  4:40 PM   Specimen: Anterior Nasal Swab  Result Value Ref Range Status   SARS Coronavirus 2 by RT PCR NEGATIVE NEGATIVE Final    Comment: (NOTE) SARS-CoV-2 target nucleic acids are NOT DETECTED.  The SARS-CoV-2 RNA is generally detectable in upper respiratory specimens during the acute phase of infection. The lowest concentration of SARS-CoV-2 viral copies this assay can detect is 138 copies/mL. A negative result does not preclude SARS-Cov-2 infection and should not be used as the sole basis for treatment or other patient management decisions. A negative result may occur with  improper specimen collection/handling, submission of specimen other than nasopharyngeal swab, presence of viral mutation(s) within the areas targeted by this assay, and inadequate number of viral copies(<138 copies/mL). A negative result must be combined with clinical observations, patient history, and epidemiological information. The expected result is Negative.  Fact Sheet for Patients:  BloggerCourse.com  Fact Sheet for Healthcare Providers:   SeriousBroker.it  This test is no t yet approved or cleared by the [place] FDA and  has been authorized for detection and/or diagnosis of SARS-CoV-2 by FDA under an Emergency Use Authorization (EUA). This EUA will remain  in effect (meaning this test can be used) for the duration of the COVID-19 declaration under Section 564(b)(1) of the Act, 21 U.S.C.section 360bbb-3(b)(1), unless the authorization is terminated  or revoked sooner.       Influenza A by PCR NEGATIVE NEGATIVE Final   Influenza B by PCR NEGATIVE NEGATIVE Final    Comment: (NOTE) The Xpert Xpress SARS-CoV-2/FLU/RSV plus assay is intended as an aid in the diagnosis of influenza from Nasopharyngeal swab specimens and should not be used as a sole basis for treatment. Nasal washings and aspirates are unacceptable for Xpert Xpress SARS-CoV-2/FLU/RSV testing.  Fact Sheet for Patients: BloggerCourse.com  Fact Sheet for Healthcare Providers: SeriousBroker.it  This test is not yet approved or cleared by the [place] FDA and has been authorized for detection and/or diagnosis of SARS-CoV-2 by FDA under an Emergency Use Authorization (EUA). This EUA will remain in effect (meaning this test can be used) for the duration of the COVID-19 declaration under Section 564(b)(1) of the Act, 21 U.S.C. section 360bbb-3(b)(1), unless the authorization is terminated or revoked.     Resp Syncytial Virus by PCR NEGATIVE NEGATIVE Final    Comment: (NOTE) Fact Sheet for Patients: BloggerCourse.com  Fact Sheet for Healthcare Providers: SeriousBroker.it  This test is not yet approved or cleared by the [place] FDA and has been authorized for detection and/or diagnosis of SARS-CoV-2 by FDA under an Emergency Use Authorization (EUA). This EUA will remain in effect (meaning this test can be used) for  the duration of the COVID-19 declaration under Section 564(b)(1) of the Act, 21 U.S.C. section 360bbb-3(b)(1), unless the authorization is terminated or revoked.  Performed at [facility], [address], [place], [place] [address]   Culture, blood (routine x 2)     Status: None (Preliminary result)   Collection Time: 05/29/22  6:08 PM   Specimen: BLOOD LEFT ARM  Result Value Ref Range Status   Specimen Description BLOOD LEFT ARM  Final   Special Requests  Final    BOTTLES DRAWN AEROBIC AND ANAEROBIC Blood Culture results may not be optimal due to an excessive volume of blood received in culture bottles   Culture   Final    NO GROWTH 3 DAYS Performed at [facility], [address] [place], [place] [address]    Report Status PENDING  Incomplete  Culture, blood (routine x 2)     Status: None (Preliminary result)   Collection Time: 05/30/22  3:45 AM   Specimen: BLOOD  Result Value Ref Range Status   Specimen Description BLOOD SITE NOT SPECIFIED  Final   Special Requests   Final    BOTTLES DRAWN AEROBIC AND ANAEROBIC Blood Culture adequate volume   Culture   Final    NO GROWTH 2 DAYS Performed at [facility], [address], [place], [place] [address]    Report Status PENDING  Incomplete    Time coordinating discharge: 35 minutes  Signed: Bernadette Gores  [facility] 06/01/2022, 11:20 AM

## 2022-06-03 LAB — CULTURE, BLOOD (ROUTINE X 2): Culture: NO GROWTH

## 2022-06-04 LAB — CULTURE, BLOOD (ROUTINE X 2)
Culture: NO GROWTH
Special Requests: ADEQUATE

## 2022-06-13 ENCOUNTER — Ambulatory Visit: Payer: Medicaid Other

## 2022-06-28 ENCOUNTER — Ambulatory Visit (INDEPENDENT_AMBULATORY_CARE_PROVIDER_SITE_OTHER): Payer: Medicaid Other

## 2022-06-28 ENCOUNTER — Ambulatory Visit: Payer: Medicaid Other

## 2022-06-28 VITALS — BP 106/65 | HR 92 | Temp 98.3°F

## 2022-06-28 DIAGNOSIS — N921 Excessive and frequent menstruation with irregular cycle: Secondary | ICD-10-CM

## 2022-06-28 DIAGNOSIS — Z3042 Encounter for surveillance of injectable contraceptive: Secondary | ICD-10-CM | POA: Diagnosis not present

## 2022-06-28 DIAGNOSIS — R42 Dizziness and giddiness: Secondary | ICD-10-CM

## 2022-06-28 DIAGNOSIS — N939 Abnormal uterine and vaginal bleeding, unspecified: Secondary | ICD-10-CM

## 2022-06-28 LAB — POCT PREGNANCY, URINE: Preg Test, Ur: NEGATIVE

## 2022-06-28 MED ORDER — MEDROXYPROGESTERONE ACETATE 150 MG/ML IM SUSP
150.0000 mg | Freq: Once | INTRAMUSCULAR | Status: AC
  Administered 2022-06-28: 150 mg via INTRAMUSCULAR

## 2022-06-28 NOTE — Progress Notes (Signed)
Kelly Huang here for Depo-Provera Injection 1 day outside of recommended window. UPT today is negative. Patient reports vaginal bleeding began 4 days ago. 2 days ago bleeding became heavier; reports changing pad every 45 minutes-1hr. Dizzy and lightheaded. Vitals today WNL. Reviewed with Currie Paris, MD who gives verbal order for CBC. If bleeding continues following Depo injection, pt instructed to call the office.   Injection administered without complication. Patient will return in 3 months for next injection between 09/14/22 and 09/28/22. Next annual visit due November 2023.   Annabell Howells, RN 06/28/2022  4:58 PM

## 2022-06-29 LAB — CBC
Hematocrit: 41.4 % (ref 34.0–46.6)
Hemoglobin: 13.7 g/dL (ref 11.1–15.9)
MCH: 28.6 pg (ref 26.6–33.0)
MCHC: 33.1 g/dL (ref 31.5–35.7)
MCV: 86 fL (ref 79–97)
Platelets: 289 10*3/uL (ref 150–450)
RBC: 4.79 x10E6/uL (ref 3.77–5.28)
RDW: 13.1 % (ref 11.7–15.4)
WBC: 4 10*3/uL (ref 3.4–10.8)

## 2022-07-03 ENCOUNTER — Telehealth: Payer: Self-pay | Admitting: Family Medicine

## 2022-07-03 NOTE — Telephone Encounter (Signed)
Patient is having some symptoms of spotting and weakness would like a nurse to give her a call back. She received depo shot on 1/24

## 2022-07-04 NOTE — Telephone Encounter (Signed)
Patient was having heavy bleeding day of last Depo Provera. The day after injection bleeding stopped. Since then spotting has intermittent. Continues to feel weak, having chills. Per chart review Hgb on 06/28/22 was 13.7. Recommended pt go to urgent care or make new patient appointment with PCP. Pt will call back if spotting is still present in 2 weeks.

## 2022-07-10 ENCOUNTER — Telehealth: Payer: Self-pay | Admitting: Family Medicine

## 2022-07-10 NOTE — Telephone Encounter (Signed)
Patient said she had an appt last week and she discussed her bleeding issue, he state now she is bleeding more and she want to speak with a nurse

## 2022-07-11 NOTE — Telephone Encounter (Signed)
Called pt to discuss her concerns over her breakthrough bleeding. She states after she got her Depo shot last week (01/24) her bleeding had slowed to spotting. Now, over the last 2 days her bleeding has worsened and she is changing her pad hourly. Pt requesting appointment to meet with provider. I told her the front office would reach out to get her scheduled.

## 2022-08-11 ENCOUNTER — Telehealth: Payer: Self-pay

## 2022-08-11 NOTE — Telephone Encounter (Signed)
Mychart msg sent

## 2022-08-16 ENCOUNTER — Ambulatory Visit: Payer: Medicaid Other | Admitting: Obstetrics and Gynecology

## 2022-09-06 ENCOUNTER — Ambulatory Visit (HOSPITAL_COMMUNITY)
Admission: EM | Admit: 2022-09-06 | Discharge: 2022-09-06 | Disposition: A | Discharge status: Home / Self Care | Payer: Commercial Managed Care - HMO | Attending: Behavioral Health | Admitting: Behavioral Health

## 2022-09-06 ENCOUNTER — Encounter (HOSPITAL_COMMUNITY): Payer: Self-pay | Admitting: Behavioral Health

## 2022-09-06 DIAGNOSIS — F1410 Cocaine abuse, uncomplicated: Secondary | ICD-10-CM | POA: Diagnosis not present

## 2022-09-06 NOTE — BH Assessment (Addendum)
Comprehensive Clinical Assessment (CCA) Note  09/25/2022 Kelly Huang [id number]  DISPOSITION: Per Lou Cal NP pt is psychiatrically cleared.   The patient demonstrates the following risk factors for suicide: Chronic risk factors for suicide include: psychiatric disorder of MDD and GAD and substance use disorder. Acute risk factors for suicide include: unemployment and loss (financial, interpersonal, professional). Protective factors for this patient include: hope for the future. Considering these factors, the overall suicide risk at this point appears to be low. Patient is appropriate for outpatient follow up.    Pt is a 37 yo female who presented seeking detox from crack cocaine and alcohol. Pt is voluntary and unaccomapnied. Pt denied SI, any suicide attempts, HI, NSSH and AVH. Pt stated that she often feels as if she is being watched but that it may be due to her cocaine use. Pt stated that she uses crack cocaine daily since she was 37 yo. Pt stated that she drinks alcohol about 1-2 times per week. Pt stated that she has minimal withdrawal sx if not using. Pt reported no seizure hx. Pt reported no sx of withdrawal. Pt denied any IP psychiatric admissions.   Pt reported she was homeless and unemployed. Pt stated that she has been depressed for the last 2 years due to the death in Oct 01, 2011 09/25/2022) of her daughter who was stillborn and the death 4 months ago of her son who lived 3 weeks. No hx of abuse. Pt denied any current legal issues and access to firearms.   Pt was anxious, restless and jittery, polite, cooperative, alert and seemed fully oriented. Pt dressed casually and was malodorous.. Pt's speech was pressured and pt was continually in motion. Pt's mood was depressed and affect was flat which was congruent.   Chief Complaint:  Chief Complaint  Patient presents with   Addiction Problem   Visit Diagnosis:  Stimulant Use d/o, Cocaine, severe Alcohol Use d/o, Moderate MDD, Recurrent,  Moderate GAD    CCA Screening, Triage and Referral (STR)  Patient Reported Information How did you hear about [place]? Self  What Is the Reason for Your Visit/Call Today? Pt is a 37 yo female who presented seeking detox from crack cocaine and alcohol. Pt is voluntary and unaccomapnied. Pt denied SI, any suicide attempts, HI, NSSH and AVH. Pt stated that she often feels as if she is being watched but that it may be due to her cocaine use. Pt stated that she uses crack cocaine daily since she was 37 yo. Pt stated that she drinks alcohol about 1-2 times per week. Pt stated that she has minimal withdrawal sx if not using. Pt reported no seizure hx.  How Long Has This Been Causing You Problems? > than 6 months  What Do You Feel Would Help You the Most Today? Alcohol or Drug Use Treatment   Have You Recently Had Any Thoughts About Hurting Yourself? No  Are You Planning to Commit Suicide/Harm Yourself At This time? No   Flowsheet Row ED from 2022-09-25 in [facility] ED to Hosp-Admission (Discharged) from 05/29/2022 in [place] [facility] ED from 05/27/2022 in [facility] Emergency Department at [place] No Risk No Risk No Risk       Have you Recently Had Thoughts About [place]? No  Are You Planning to Harm Someone at This Time? No  Explanation: no  Have You Used Any Alcohol or Drugs in the Past 24 Hours? Yes  What Did You Use and How Much? unknown amount- varies   Do You Currently Have a Therapist/Psychiatrist? No  Name of Therapist/Psychiatrist: Name of Therapist/Psychiatrist: na   Have You Been Recently Discharged From Any Office Practice or Programs? No  Explanation of Discharge From Practice/Program: na     CCA Screening Triage Referral Assessment Type of Contact: Face-to-Face  Telemedicine Service Delivery:   Is this Initial or Reassessment?   Date Telepsych consult ordered in [facility]:     Time Telepsych consult ordered in [facility]:    Location of Assessment: [facility]  Provider Location: [facility] Assessment Services   Collateral Involvement: none   Does Patient Have a [place]? No  Legal Guardian Contact Information: na  Copy of Legal Guardianship Form: No - copy requested  Legal Guardian Notified of Arrival: -- (na)  Legal Guardian Notified of Pending Discharge: -- (na)  If Minor and Not Living with Parent(s), Who has Custody? adult  Is CPS involved or ever been involved? Never (none reported)  Is APS involved or ever been involved? Never (none reported)   Patient Determined To Be At Risk for Harm To Self or Others Based on Review of Patient Reported Information or Presenting Complaint? No  Method: No Plan  Availability of Means: No access or NA  Intent: Vague intent or NA  Notification Required: No need or identified person  Additional Information for Danger to Others Potential: na Additional Comments for Danger to Others Potential: na  Are There Guns or Other Weapons in Your Home? No (denied)  Types of Guns/Weapons: na  Are These Weapons Safely Secured?                            -- (na)  Who Could Verify You Are Able To Have These Secured: na  Do You Have any Outstanding Charges, Pending Court Dates, Parole/Probation? none reported  Contacted To Inform of Risk of Harm To Self or Others: -- (na)    Does Patient Present under Involuntary Commitment? No    [place] of Residence: Other (Comment) (Currently hoimeless in [place]; Address in [place])   Patient Currently Receiving the Following Services: Not Receiving Services   Determination of Need: Urgent (48 hours)   Options For Referral: Facility-Based Crisis     CCA Biopsychosocial Patient Reported Schizophrenia/Schizoaffective Diagnosis in Past: No   Strengths: resilient   Mental Health Symptoms Depression:   Change in  energy/activity; Difficulty Concentrating; Fatigue; Hopelessness; Increase/decrease in appetite; Irritability; Sleep (too much or little); Tearfulness; Worthlessness   Duration of Depressive symptoms:  Duration of Depressive Symptoms: Greater than two weeks   Mania:   Recklessness; Increased Energy   Anxiety:    Irritability; Restlessness; Worrying   Psychosis:   None   Duration of Psychotic symptoms:    Trauma:   None   Obsessions:   None   Compulsions:   None   Inattention:   N/A   Hyperactivity/Impulsivity:   N/A   Oppositional/Defiant Behaviors:   N/A   Emotional Irregularity:   None   Other Mood/Personality Symptoms:   na    Mental Status Exam Appearance and self-care  Stature:   Small   Weight:   Thin   Clothing:   Casual; Disheveled   Grooming:   Neglected   Cosmetic use:   None   Posture/gait:   Normal   Motor activity:   Restless   Sensorium  Attention:  Normal   Concentration:   Preoccupied   Orientation:   Object; Person; Place; Situation; Time; X5   Recall/memory:   Normal   Affect and Mood  Affect:   Depressed; Flat; Anxious   Mood:   Anxious; Dysphoric; Worthless; Hopeless   Relating  Eye contact:   Normal   Facial expression:   Depressed   Attitude toward examiner:   Cooperative   Thought and Language  Speech flow:  Clear and Coherent; Pressured; Paucity; Slurred   Thought content:   Appropriate to Mood and Circumstances   Preoccupation:   None   Hallucinations:   None   Organization:   Intact   Transport planner of Knowledge:   Average   Intelligence:   Average   Abstraction:   Functional   Judgement:   Impaired   Reality Testing:   Adequate   Insight:   Flashes of insight; Lacking   Decision Making:   Impulsive   Social Functioning  Social Maturity:   Impulsive   Social Judgement:   Heedless   Stress  Stressors:   Housing; Museum/gallery curator; Relationship    Coping Ability:   Deficient supports   Skill Deficits:   Self-care; Self-control   Supports:   Family; Friends/Service system     Religion: Religion/Spirituality Are You A Religious Person?: Yes What is Your Religious Affiliation?: Christian How Might This Affect Treatment?: unknown  Leisure/Recreation: Leisure / Recreation Do You Have Hobbies?: No  Exercise/Diet: Exercise/Diet Do You Exercise?: No Have You Gained or Lost A Significant Amount of Weight in the Past Six Months?: No Do You Follow a Special Diet?: No Do You Have Any Trouble Sleeping?: Yes Explanation of Sleeping Difficulties: homeless   CCA Employment/Education Employment/Work Situation: Employment / Work Situation Employment Situation: Unemployed (for 2 years) Patient's Job has Been Impacted by Current Illness:  (na) Has Patient ever Been in the [employer]?: No  Education: Education Is Patient Currently Attending School?: No Last Grade Completed: 12 (some college) Did Physicist, medical?: No Did You Have An Individualized Education Program (IIEP): No Did You Have Any Difficulty At School?: No Patient's Education Has Been Impacted by Current Illness: No   CCA Family/Childhood History Family and Relationship History: Family history Marital status: Single Does patient have children?: No (Pt reported that she had a daughter that was stillborn in 2013 and a son who lived 3 weeks and died 4 months ago.)  Childhood History:  Childhood History By whom was/is the patient raised?: Grandparents Did patient suffer any verbal/emotional/physical/sexual abuse as a child?: No Did patient suffer from severe childhood neglect?: No Has patient ever been sexually abused/assaulted/raped as an adolescent or adult?: No Was the patient ever a victim of a crime or a disaster?: No Witnessed domestic violence?: No Has patient been affected by domestic violence as an adult?: No       CCA Substance  Use Alcohol/Drug Use: Alcohol / Drug Use Pain Medications: see MAR Prescriptions: see MAR Over the Counter: see MAR History of alcohol / drug use?: Yes Longest period of sobriety (when/how long): unknown Negative Consequences of Use: Museum/gallery curator, Work / Youth worker, Scientist, research (physical sciences) Withdrawal Symptoms: None Substance #1 Name of Substance 1: crack cocaine 1 - Age of First Use: 33 1 - Amount (size/oz): varies 1 - Frequency: daily 1 - Duration: ongoing 1 - Last Use / Amount: earlier today 1 - Method of Aquiring: unknown 1- Route of Use: smoke Substance #2 Name of Substance 2: alcohol 2 - Age of First  Use: teen 2 - Amount (size/oz): varies 2 - Frequency: 1-2 times per week 2 - Duration: ongoing 2 - Last Use / Amount: 2 days ago 2 - Method of Aquiring: unknown 2 - Route of Substance Use: oral                     ASAM's:  Six Dimensions of Multidimensional Assessment  Dimension 1:  Acute Intoxication and/or Withdrawal Potential:      Dimension 2:  Biomedical Conditions and Complications:      Dimension 3:  Emotional, Behavioral, or Cognitive Conditions and Complications:  Dimension 3:  Description of emotional, behavioral, or cognitive conditions and complications: MDD, GAD  Dimension 4:  Readiness to Change:     Dimension 5:  Relapse, Continued use, or Continued Problem Potential:     Dimension 6:  Recovery/Living Environment:     ASAM Severity Score:    ASAM Recommended Level of Treatment: ASAM Recommended Level of Treatment: Level II Intensive Outpatient Treatment   Substance use Disorder (SUD) Substance Use Disorder (SUD)  Checklist Symptoms of Substance Use: Continued use despite having a persistent/recurrent physical/psychological problem caused/exacerbated by use, Continued use despite persistent or recurrent social, interpersonal problems, caused or exacerbated by use, Presence of craving or strong urge to use, Recurrent use that results in a failure to fulfill major role  obligations (work, school, home)  Recommendations for Services/Supports/Treatments: Recommendations for Services/Supports/Treatments Recommendations For Services/Supports/Treatments: CD-IOP Intensive Chemical Dependency Program  Discharge Disposition:    DSM5 Diagnoses: Patient Active Problem List   Diagnosis Date Noted   CAP (community acquired pneumonia) 05/29/2022   Homeless 05/29/2022   Postpartum care following vaginal delivery 04/17/2022   Postpartum depression 04/17/2022   History of seizure 02/19/2022   History of IUFD 02/19/2022   Cocaine abuse 02/18/2022     Referrals to Alternative Service(s): Referred to Alternative Service(s):   Place:   Date:   Time:    Referred to Alternative Service(s):   Place:   Date:   Time:    Referred to Alternative Service(s):   Place:   Date:   Time:    Referred to Alternative Service(s):   Place:   Date:   Time:     Ulyssa Walthour T, Counselor

## 2022-09-06 NOTE — Progress Notes (Signed)
   09/06/22 1506  [place] (Walk-ins at [facility] only)  How Did You Hear About [place]? Self  What Is the Reason for Your Visit/Call Today? Pt is a 37 yo female who presented seeking detox from crack cocaine and alcohol. Pt is voluntary and unaccomapnied. Pt denied SI, any suicide attempts, HI, NSSH and AVH. Pt stated that she often feels as if she is being watched but that it may be due to her cocaine use. Pt stated that she uses crack cocaine daily since she was 37 yo. Pt stated that she drinks alcohol about 1-2 times per week. Pt stated that she has minimal withdrawal sx if not using. Pt reported no seizure hx.  How Long Has This Been Causing You Problems? > than 6 months  Have You Recently Had Any Thoughts About Hurting Yourself? No  Are You Planning to Commit Suicide/Harm Yourself At This time? No  Have you Recently Had Thoughts About [place]? No  Are You Planning To Harm Someone At This Time? No  Are you currently experiencing any auditory, visual or other hallucinations? No  Have You Used Any Alcohol or Drugs in the Past 24 Hours? Yes  How long ago did you use Drugs or Alcohol? crack cocaine today  What Did You Use and How Much? unknown amount- varies  Do you have any current medical co-morbidities that require immediate attention? No  Clinician description of patient physical appearance/behavior: anxious, restless and jittery, polite, cooperative, alert and seemed fully oriented. dressed casually and was malodorous.Marland Kitchen speech was pressured and pt was continually in motion. Mood was depressed and affect was flat which was congruent.  What Do You Feel Would Help You the Most Today? Alcohol or Drug Use Treatment  If access to [facility] was not available, would you have sought care in the Emergency Department? No  Determination of Need Urgent (48 hours)  Options For Referral Facility-Based Crisis

## 2022-09-06 NOTE — Discharge Summary (Signed)
[name] Figures to be D/C'd Home per NP order. An After Visit Summary was printed and given to the patient by provider. Patient escorted out, and D/C home via private auto.  Kelly Huang  09/06/2022 4:02 PM

## 2022-09-06 NOTE — ED Provider Notes (Signed)
Behavioral Health Urgent Care Medical Screening Exam  Patient Name: Kelly Huang MRN: [id number] Date of Evaluation: 09/06/22 Chief Complaint:  "I'm ready to quit" Diagnosis:  Final diagnoses:  Cocaine use disorder   History of Present Illness: Kelly Huang is a 37 y.o. female patient with a past psychiatric history of cocaine use disorder who presented voluntarily and unaccompanied to [facility] requesting detox.   Patient assessed face-to-face by this provider and chart reviewed on 09/06/22. On evaluation, Kelly Huang is seated in assessment area in no acute distress. Patient is alert and oriented x4, calm, cooperative, and pleasant during assessment. Speech is clear and coherent, normal rate and volume. Patient appears disheveled. Eye contact is fair. Mood is euthymic with congruent affect. Thought process is coherent with logical thought content. Patient denies suicidal and homicidal ideations and easily contracts verbally for safety with this Probation officer. Patient states she did experience passive suicidal ideations "years ago." Patient denies a history of past suicide attempts or self-harm. Patient denies past inpatient psychiatric hospitalizations or substance use treatment. Patient denies auditory and visual hallucinations. Patient reports symptoms of paranoia "like people are following me" when she uses substances. Patient is able to converse coherently with goal-directed thoughts and no distractibility or preoccupation. Objectively, there is no evidence of psychosis/mania, delusional thinking, or indication that patient is responding to internal/external stimuli.   Patient endorses fair sleep and appetite, but states her sleep and appetite decrease when she uses substances. Patient reports that she has been unemployed for the past 2 years and is "basically homeless." Patient reports she came to [place] 3 months ago from [place] to reside with a roommate, but is "homeless by choice for the  last 2 years" due to moving around frequently related to her substance use. Patient denies access to weapons. Patient does not currently have outpatient psychiatric services in place. Patient denies currently taking any psychotropic medications. Patient endorses daily crack cocaine use for the past 2-3 years and "occasional" alcohol use. Patient is unable to quantify the exact amounts of substances that she uses but states she uses "a lot of" crack cocaine and 1-2 drinks of alcohol per week. Patient reports last using crack cocaine today and last using alcohol 2-3 days ago. Patient denies use of other illicit substances. Patient denies current withdrawal symptoms. Patient denies current legal issues. Patient states she is interested in substance use treatment. Patient also states she would like some food and somewhere to stay.  Discussed following-up with resources provided for substance use and outpatient psychiatric care. Also discussed resources for local shelters. Patient is in agreement with plan of care.  At this time, Kelly Huang is educated and verbalizes understanding of mental health resources and other crisis services in the community. She is instructed to call 911 and present to the nearest emergency room should she experience any suicidal/homicidal ideation, auditory/visual/hallucinations, or detrimental worsening of her mental health condition. She was a also advised by Probation officer that she could call the toll-free phone on back of  insurance card to assist with identifying in network services and agencies or the number on back of Medicaid card to speak with care coordinator.  [facility] from 09/06/2022 in [facility] ED to Hosp-Admission (Discharged) from 05/29/2022 in [place] [facility] ED from 05/27/2022 in [facility] Emergency Department at [place] No Risk No Risk No Risk       Psychiatric Specialty  Exam  Presentation  General  Appearance:Disheveled; Appropriate for Environment  Eye Contact:Fair  Speech:Clear and Coherent; Normal Rate  Speech Volume:Normal  Handedness:Right   Mood and Affect  Mood: Euthymic  Affect: Congruent   Thought Process  Thought Processes: Coherent; Goal Directed  Descriptions of Associations:Intact  Orientation:Full (Time, Place and Person)  Thought Content:Logical  Diagnosis of Schizophrenia or Schizoaffective disorder in past: No   Hallucinations:None  Ideas of Reference:None  Suicidal Thoughts:No  Homicidal Thoughts:No   Sensorium  Memory: Immediate Fair; Recent Fair; Remote Fair  Judgment: Fair  Insight: Fair   Community education officer  Concentration: Fair  Attention Span: Fair  Recall: [employer] of Knowledge: Fair  Language: Fair   Psychomotor Activity  Psychomotor Activity: Normal   Assets  Assets: Armed forces logistics/support/administrative officer; Desire for Improvement; Physical Health; Resilience   Sleep  Sleep: Fair  Number of hours:  6   Physical Exam: Physical Exam Vitals and nursing note reviewed.  Constitutional:      General: She is not in acute distress.    Appearance: Normal appearance. She is not ill-appearing.  HENT:     Head: Normocephalic and atraumatic.     Nose: Nose normal.  Eyes:     General:        Right eye: No discharge.        Left eye: No discharge.     Conjunctiva/sclera: Conjunctivae normal.  Cardiovascular:     Rate and Rhythm: Normal rate.  Pulmonary:     Effort: Pulmonary effort is normal. No respiratory distress.  Musculoskeletal:        General: Normal range of motion.     Cervical back: Normal range of motion.  Skin:    General: Skin is warm and dry.  Neurological:     General: No focal deficit present.     Mental Status: She is alert and oriented to person, place, and time. Mental status is at baseline.  Psychiatric:        Attention and Perception: Attention and  perception normal.        Mood and Affect: Mood and affect normal.        Speech: Speech normal.        Behavior: Behavior normal. Behavior is cooperative.        Thought Content: Thought content normal.        Cognition and Memory: Cognition and memory normal.        Judgment: Judgment normal.    Review of Systems  Constitutional: Negative.   HENT: Negative.    Eyes: Negative.   Respiratory: Negative.    Cardiovascular: Negative.   Gastrointestinal: Negative.   Genitourinary: Negative.   Musculoskeletal: Negative.   Skin: Negative.   Neurological: Negative.   Endo/Heme/Allergies: Negative.   Psychiatric/Behavioral:  Positive for substance abuse. Negative for depression, hallucinations, memory loss and suicidal ideas. The patient is not nervous/anxious and does not have insomnia.    Blood pressure 110/88, pulse 81, temperature 97.7 F (36.5 C), temperature source Oral, resp. rate 18, SpO2 100 %, unknown if currently breastfeeding. There is no height or weight on file to calculate BMI.  Musculoskeletal: Strength & Muscle Tone: within normal limits Gait & Station: normal Patient leans: N/A   [place] [facility] Discharge Disposition for Follow up and Recommendations: Based on my evaluation the patient does not appear to have an emergency medical condition and can be discharged with resources and follow up care in outpatient services for substance use treatment, therapy/medication management, and shelter resources.   Creola Corn  Juliana Boling, NP 09/06/2022, 5:50 PM

## 2022-09-06 NOTE — Discharge Instructions (Addendum)
Substance Abuse Resources  [facility] - Admissions are currently completed Monday through Friday at 8am; both appointments and walk-ins are accepted.  Any individual that is a [place] resident may present for a substance abuse screening and assessment for admission.  A person may be referred by numerous sources or self-refer.   Potential clients will be screened for medical necessity and appropriateness for the program.  Clients must meet criteria for high-intensity residential treatment services.  If clinically appropriate, a client will continue with the comprehensive clinical assessment and intake process, as well as enrollment in the [facility].   Address: [address] [place], [place] [address] Admin Hours: Mon-Fri 8AM to 5PM Center Hours: 24/7 Phone: [phone] Fax: [phone]   [facility] (Det[facility]) Facility Based Crisis:  These are 3 locations for services: Please call before arrival    Address: [address] [place], [place] [address] Phone: [phone]   Address: [address], [place], [place] [address] Phone#: [phone]   Address: [address], [place], [place] [address] Phone#: [phone]     [facility]): (offers outpatient therapy and intensive outpatient substance abuse therapy).  [address], [place], [place] [address] Phone: [phone]   [facility]: Offers FREE recovery skills classes, support groups, 1:1 Peer Support, and Compeer Classes. [address], [place], [place] [address] Phone: [phone] (Call to complete intake).  [facility] [address] [place], [place] [address] Phone: [phone] ext: [phone] [facility] provides food, shelter and other programs and services to the homeless men of -[place] through our men's program.   By offering safe shelter, three meals a day,  clean clothing, Biblical counseling, financial planning, vocational training, GED/education and employment assistance, we've helped mend the shattered lives of many homeless men since opening in 1974.   We have approximately 267 beds available, with a max of 312 beds including mats for emergency situations and currently house an average of 270 men a night.   Prospective Client Check-In Information Photo ID Required (State/ Out of State/ DOC) - if photo ID is not available, clients are required to have a printout of a police/sheriff's criminal history report. Help out with chores around the Mission. No sex offender of any type (pending, charged, registered and/or any other sex related offenses) will be permitted to check in. Must be willing to abide by all rules, regulations, and policies established by the [facility]. The following will be provided - shelter, food, clothing, and biblical counseling. If you or someone you know is in need of assistance at our men's shelter in , [place], please call [phone] ext. [phone].   [facility]-will provide timely access to mental health services for children and adolescents (4-17) and adults presenting in a mental health crisis. The program is designed for those who need urgent Behavioral Health or Substance Use treatment and are not experiencing a medical crisis that would typically require an emergency room visit.    [address] [place],  [address] Phone: [phone] [email or web address]   [facility]: Phone#: [phone]   The [facility] SA Intensive Outpatient Program (SAIOP) means structured individual and group addiction activities and services that are provided at an outpatient program designed to assist adult and adolescent consumers to begin recovery and learn skills for recovery maintenance. The [facility] SAIOP program is offered at   least 3 hours a  day, 3 days a week.SAIOP services shall include a structured program consisting of, but not limited to, the following services: Individual counseling and support; Group counseling and support; Family counseling, training or support; Biochemical assays to identify recent drug use (e.g., urine drug screens); Strategies for relapse prevention to include community and social support systems in treatment; Life skills; Crisis contingency planning; Disease Management; and Treatment support activities that have been adapted or specifically designed for persons with physical disabilities, or persons with co-occurring disorders of mental illness and substance abuse/dependence or mental retardation/developmental disability and substance abuse/dependence. Phone: [place] [address] [place], [place] [address] Phone: [phone] Admissions team is available 24/7  Phone: [phone]. Fax: [phone]   [facility]     Admissions [address] [place], [place] [address] [phone]    [facility] [place]: [phone]  Behavioral Health Crisis Line: [phone]   To help you maintain a sober lifestyle, a substance use disorder treatment program may be beneficial to you.  Contact one of the following providers at your earliest opportunity to ask about enrolling in their program:   RESIDENTIAL TREATMENT:       [facility]       [id number]       [place], [place] [address]       (336) [place]:        [place]       [address], [place] [address]       [phone]        [facility] [address], [place] [address]       [phone] II       [address]       [place], [place] [address]       [phone], [place] [address]        [phone]   CHEMICAL DEPENDENCY INTENSIVE OUTPATIENT:       [facility] [place]      [place], [place] [address]      [phone]        [place]      [place], [place] [address]      [phone]       Also provides transitional housing for patients in their treatment program   [place], [place], [address] [phone] phone  Offers food and emergency or transitional housing to men, women, or families in need. Clients participate in programs and workshops developed to promote self-sufficiency and personal development.Call or walk in. Applications are accepted Monday, Wednesday, and Friday by appointment only. Need photo ID and proof of income.  [facility] [address] [place],  [address] [phone] Population served: Adult men & women (37 years old and older, able to perform activities for daily living) Documents required: Valid ID & [place]  [place], [place]  [address] [phone] Population served: Families with children  [place] [address] [place], [place]  [address] [phone] Population served: Single women 18+ without dependents Documents required:  [place] Card  [facility] [address] [place], [place]  [address] [phone] Population served: Female veterans 18+ with substance abuse/mental health issues Eligibility: By referral only  [facility] [address], [place], [place] [address] [phone] Population served: Males 18+ Documents required: Valid ID & Social Security Card  Room at [employer] [facility] the [place]. [address], [place] [address] [phone] or [phone] Population served: Pregnant women with or without  children  Documents required: Valid ID & Social Security [employer] [address], [place], [place] [address] [phone] Population Served: Families with children  [facility] [place] [address] [place], [place] [address] [phone] Population served: Men 18+, preference for disabled and/or veterans Eligibility: By referral only  [facility]) - Emergency Family Shelter [place], [place],  [address] [phone] or [phone] Population served: Families with children.    WOMEN ONLY  The Shelter serves up to 20 women each night. Open from December 11th through the end of March in the evenings from 5:30 pm until 7:30 am, the Shelter provides a hot evening meal, shower and sleeping facilities, and food for the next day. Secure parking is available beside the building.     Shelter Address   Directions [facility] [place], [place]  If you are in need of housing through the shelter, contact the [employer] at [phone].

## 2022-09-14 ENCOUNTER — Ambulatory Visit: Payer: Medicaid Other

## 2022-10-03 ENCOUNTER — Ambulatory Visit: Payer: Medicaid Other | Admitting: Obstetrics and Gynecology

## 2022-11-09 ENCOUNTER — Ambulatory Visit: Payer: Medicaid Other | Admitting: Obstetrics and Gynecology

## 2022-11-20 ENCOUNTER — Encounter (HOSPITAL_COMMUNITY): Payer: Self-pay | Admitting: Emergency Medicine

## 2022-11-20 ENCOUNTER — Emergency Department (HOSPITAL_COMMUNITY): Payer: Commercial Managed Care - HMO

## 2022-11-20 ENCOUNTER — Other Ambulatory Visit: Payer: Self-pay

## 2022-11-20 ENCOUNTER — Emergency Department (HOSPITAL_COMMUNITY)
Admission: EM | Admit: 2022-11-20 | Discharge: 2022-11-20 | Disposition: A | Discharge status: Home / Self Care | Payer: Commercial Managed Care - HMO | Attending: Emergency Medicine | Admitting: Emergency Medicine

## 2022-11-20 DIAGNOSIS — R059 Cough, unspecified: Secondary | ICD-10-CM | POA: Diagnosis present

## 2022-11-20 DIAGNOSIS — R051 Acute cough: Secondary | ICD-10-CM | POA: Diagnosis not present

## 2022-11-20 MED ORDER — ALBUTEROL SULFATE HFA 108 (90 BASE) MCG/ACT IN AERS
2.0000 | INHALATION_SPRAY | RESPIRATORY_TRACT | Status: DC | PRN
  Administered 2022-11-20: 2 via RESPIRATORY_TRACT
  Filled 2022-11-20: qty 6.7

## 2022-11-20 MED ORDER — PREDNISONE 20 MG PO TABS: 40.0000 mg | ORAL_TABLET | Freq: Every day | ORAL | 0 refills | Status: DC

## 2022-11-20 MED ORDER — AZITHROMYCIN 250 MG PO TABS: 250.0000 mg | ORAL_TABLET | Freq: Every day | ORAL | 0 refills | Status: DC

## 2022-11-20 MED ORDER — BENZONATATE 100 MG PO CAPS: 100.0000 mg | ORAL_CAPSULE | Freq: Two times a day (BID) | ORAL | 0 refills | Status: DC | PRN

## 2022-11-20 MED ORDER — ALBUTEROL SULFATE HFA 108 (90 BASE) MCG/ACT IN AERS: 2.0000 | INHALATION_SPRAY | RESPIRATORY_TRACT | 0 refills | Status: DC | PRN

## 2022-11-20 NOTE — ED Notes (Signed)
Pt sleeping in ems triage.

## 2022-11-20 NOTE — ED Notes (Signed)
Pt ambulatory to waiting room. Pt has inhaler with her.

## 2022-11-20 NOTE — ED Triage Notes (Signed)
[place] with c/o worsening SHOB today. Has hx of asthma. Lung sounds clear per ems with productive cough. VSS with ems.

## 2022-11-20 NOTE — ED Provider Notes (Signed)
[facility]-EMERGENCY DEPT [facility] Emergency Department Provider Note MRN:  [id number]  Arrival date & time: 11/20/22     Chief Complaint   Shortness of Breath   History of Present Illness   Kelly Huang is a 37 y.o. year-old female presents to the ED with chief complaint of asthma exacerbation.  [place] SOB and cough.  Hx of cocaine abuse. She states that this feels similar to the last time she had pneumonia.  She has not been able to bring anything up with the cough.  She's not sure if she's had a fever.  History provided by patient.   Review of Systems  Pertinent positive and negative review of systems noted in HPI.    Physical Exam   Vitals:   11/20/22 1956  BP: 108/70  Pulse: 86  Resp: 18  Temp: 98.1 F (36.7 C)  SpO2: 100%    CONSTITUTIONAL:  non toxic-appearing, NAD NEURO:  Alert and oriented x 3, CN 3-12 grossly intact EYES:  eyes equal and reactive ENT/NECK:  Supple, no stridor  CARDIO:  normal rate, regular rhythm, appears well-perfused  PULM:  No respiratory distress, CTAB, no w/r/r GI/GU:  non-distended,  MSK/SPINE:  No gross deformities, no edema, moves all extremities  SKIN:  no rash, atraumatic   *Additional and/or pertinent findings included in MDM below  Diagnostic and Interventional Summary    EKG Interpretation  Date/Time:    Ventricular Rate:    PR Interval:    QRS Duration:   QT Interval:    QTC Calculation:   R Axis:     Text Interpretation:         Labs Reviewed - No data to display  DG Chest 2 View  Final Result      Medications  albuterol (VENTOLIN HFA) 108 (90 Base) MCG/ACT inhaler 2 puff (2 puffs Inhalation Given 11/20/22 2001)     Procedures  /  Critical Care Procedures  ED Course and Medical Decision Making  I have reviewed the triage vital signs, the nursing notes, and pertinent available records from the EMR.  Social Determinants Affecting Complexity of Care: Patient suffers  from drug abuse/addiction.   ED Course:    Medical Decision Making Patient here with cough for the past 2-3 days.  She states that it feels similar to when she has had pneumonia in the past.  She denies fevers or chills.  She tells me that she isn't able to cough anything up, but told EMS that she did have a productive cough.  She has hx of asthma.  She doesn't have any wheezing on my exam.  She doesn't appear to be in any distress.  I've ordered a CXR to r/o pneumonia.  Doubt asthma exacerbation as she is not having any wheezing.  Could be viral URI, allergies, or other.  Will trial treatment with prednisone, azithro, and tessalon.  She can continue her home albuterol.  Amount and/or Complexity of Data Reviewed Radiology: ordered and independent interpretation performed.    Details: No obvious opacity seen  Risk Prescription drug management. Decision regarding hospitalization.     Consultants: No consultations were needed in caring for this patient.   Treatment and Plan: I considered admission due to patient's initial presentation, but after considering the examination and diagnostic results, patient will not require admission and can be discharged with outpatient follow-up.    Final Clinical Impressions(s) / ED Diagnoses     ICD-10-CM   1. Acute cough  R05.1       ED Discharge Orders          Ordered    predniSONE (DELTASONE) 20 MG tablet  Daily        11/20/22 2323    azithromycin (ZITHROMAX) 250 MG tablet  Daily        11/20/22 2323    benzonatate (TESSALON) 100 MG capsule  2 times daily PRN        11/20/22 2323    albuterol (VENTOLIN HFA) 108 (90 Base) MCG/ACT inhaler  Every 4 hours PRN        11/20/22 2323              Discharge Instructions Discussed with and Provided to Patient:   Discharge Instructions   None      Roxy Horseman, PA-C 11/20/22 2323    Mesner, Barbara Cower, MD 11/20/22 2330

## 2022-11-20 NOTE — ED Notes (Signed)
Patient transported to X-ray 

## 2022-11-21 ENCOUNTER — Telehealth: Payer: Self-pay | Admitting: Obstetrics and Gynecology

## 2022-11-21 NOTE — Transitions of Care (Post Inpatient/ED Visit) (Signed)
   11/21/2022  Name: Kelly Huang MRN: [id number] DOB: 1985-12-05  Today's TOC FU Call Status: Today's TOC FU Call Status:: Unsuccessul Call (1st Attempt) Unsuccessful Call (1st Attempt) Date: 11/21/22  Attempted to reach the patient regarding the most recent Inpatient/ED visit.  Follow Up Plan: Additional outreach attempts will be made to reach the patient to complete the Transitions of Care (Post Inpatient/ED visit) call.   Kathi Der RN, BSN [place]  [employer], production - Managed Medicaid High Risk [phone]

## 2023-01-24 ENCOUNTER — Other Ambulatory Visit: Payer: Self-pay

## 2023-01-24 ENCOUNTER — Emergency Department (HOSPITAL_COMMUNITY)
Admission: EM | Admit: 2023-01-24 | Discharge: 2023-01-24 | Disposition: A | Discharge status: Home / Self Care | Payer: Commercial Managed Care - HMO | Attending: Emergency Medicine | Admitting: Emergency Medicine

## 2023-01-24 ENCOUNTER — Encounter (HOSPITAL_COMMUNITY): Payer: Self-pay

## 2023-01-24 DIAGNOSIS — N9089 Other specified noninflammatory disorders of vulva and perineum: Secondary | ICD-10-CM | POA: Diagnosis not present

## 2023-01-24 DIAGNOSIS — R42 Dizziness and giddiness: Secondary | ICD-10-CM | POA: Insufficient documentation

## 2023-01-24 DIAGNOSIS — N939 Abnormal uterine and vaginal bleeding, unspecified: Secondary | ICD-10-CM | POA: Diagnosis present

## 2023-01-24 LAB — BASIC METABOLIC PANEL
Anion gap: 15 (ref 5–15)
BUN: 5 mg/dL — ABNORMAL LOW (ref 6–20)
CO2: 26 mmol/L (ref 22–32)
Calcium: 9 mg/dL (ref 8.9–10.3)
Chloride: 98 mmol/L (ref 98–111)
Creatinine, Ser: 0.78 mg/dL (ref 0.44–1.00)
GFR, Estimated: >60 mL/min (ref >60)
Glucose, Bld: 124 mg/dL — ABNORMAL HIGH (ref 70–99)
Potassium: 3.6 mmol/L (ref 3.5–5.1)
Sodium: 139 mmol/L (ref 135–145)

## 2023-01-24 LAB — HIV ANTIBODY (ROUTINE TESTING W REFLEX): HIV Screen 4th Generation wRfx: NONREACTIVE

## 2023-01-24 LAB — CBC
HCT: 42.8 % (ref 36.0–46.0)
Hemoglobin: 13.6 g/dL (ref 12.0–15.0)
MCH: 28.5 pg (ref 26.0–34.0)
MCHC: 31.8 g/dL (ref 30.0–36.0)
MCV: 89.7 fL (ref 80.0–100.0)
Platelets: 374 10*3/uL (ref 150–400)
RBC: 4.77 MIL/uL (ref 3.87–5.11)
RDW: 13.5 % (ref 11.5–15.5)
WBC: 7.4 10*3/uL (ref 4.0–10.5)
nRBC: 0 % (ref 0.0–0.2)

## 2023-01-24 LAB — HCG, SERUM, QUALITATIVE: Preg, Serum: NEGATIVE

## 2023-01-24 LAB — CBG MONITORING, ED
Glucose-Capillary: 121 mg/dL — ABNORMAL HIGH (ref 70–99)
Glucose-Capillary: 96 mg/dL (ref 70–99)

## 2023-01-24 LAB — LIPASE, BLOOD: Lipase: 30 U/L (ref 11–51)

## 2023-01-24 MED ORDER — HYDROCODONE-ACETAMINOPHEN 5-325 MG PO TABS
1.0000 | ORAL_TABLET | Freq: Once | ORAL | Status: AC
  Administered 2023-01-24: 1 via ORAL
  Filled 2023-01-24: qty 1

## 2023-01-24 MED ORDER — HYDROCODONE-ACETAMINOPHEN 5-325 MG PO TABS
2.0000 | ORAL_TABLET | ORAL | 0 refills | Status: DC | PRN
Start: 2023-01-24 — End: 2023-11-17

## 2023-01-24 NOTE — ED Provider Notes (Signed)
[place] EMERGENCY DEPARTMENT AT [facility] Provider Note   CSN: [id number] Arrival date & time: 01/24/23  1239     History  Chief Complaint  Patient presents with   Vaginal Bleeding    Kelly Huang is a 37 y.o. female.  Patient here for evaluation of abnormal vaginal bleeding x 1 week. She reports soaking a pad every hour. Today she felt lightheaded. No syncope. No nausea, vomiting. She reports vaginal sores that are painful, otherwise, no complaint of pain. No history of irregular periods in the past. No vaginal discharge. She also reports bilateral ear pain for the past 2 days associated with fever of "103". Last Tylenol or ibuprofen was >24 hours ago. No congestion or cough. No headaches.   The history is provided by the patient. No language interpreter was used.  Vaginal Bleeding      Home Medications Prior to Admission medications   Medication Sig Start Date End Date Taking? Authorizing Provider  albuterol (VENTOLIN HFA) 108 (90 Base) MCG/ACT inhaler Inhale 2 puffs into the lungs every 6 (six) hours as needed for wheezing or shortness of breath. 06/01/22   Dahal, Melina Schools, MD  albuterol (VENTOLIN HFA) 108 (90 Base) MCG/ACT inhaler Inhale 2 puffs into the lungs every 4 (four) hours as needed for wheezing or shortness of breath. 11/20/22   Roxy Horseman, PA-C  azithromycin (ZITHROMAX) 250 MG tablet Take 1 tablet (250 mg total) by mouth daily. Take first 2 tablets together, then 1 every day until finished. 11/20/22   Roxy Horseman, PA-C  benzonatate (TESSALON) 100 MG capsule Take 1 capsule (100 mg total) by mouth 2 (two) times daily as needed for cough. 11/20/22   Roxy Horseman, PA-C  levofloxacin (LEVAQUIN) 500 MG tablet Take 1 tablet (500 mg total) by mouth daily. Patient not taking: Reported on 05/29/2022 05/28/22   Darrick Grinder, PA-C  predniSONE (DELTASONE) 20 MG tablet Take 2 tablets (40 mg total) by mouth daily. 11/20/22   Roxy Horseman, PA-C       Allergies    Bactrim [sulfamethoxazole-trimethoprim]    Review of Systems   Review of Systems  Genitourinary:  Positive for vaginal bleeding.    Physical Exam Updated Vital Signs BP 133/70 (BP Location: Right Arm)   Pulse 80   Temp 99.1 F (37.3 C) (Oral)   Resp 16   Ht 5' (1.524 m)   Wt 60 kg   SpO2 99%   BMI 25.83 kg/m  Physical Exam Constitutional:      Comments: Sleepy, easily awakened  HENT:     Head: Normocephalic.     Right Ear: Tympanic membrane and ear canal normal.     Left Ear: Tympanic membrane and ear canal normal.  Eyes:     Conjunctiva/sclera: Conjunctivae normal.  Cardiovascular:     Rate and Rhythm: Normal rate and regular rhythm.  Pulmonary:     Effort: Pulmonary effort is normal.     Breath sounds: Normal breath sounds.  Abdominal:     General: There is no distension.     Palpations: Abdomen is soft.     Tenderness: There is no abdominal tenderness.  Genitourinary:    Comments: Large, firm growth over right internal labia that is pale, nonblistered, extremely tender, with rough/uneven surface. There are scattered lesions similar in appearance but small in size. No bleeding noted at introitus. Patient could not tolerate speculum exam secondary to pain.  Musculoskeletal:        General: Normal range of motion.  Cervical back: Normal range of motion.  Skin:    General: Skin is warm and dry.  Neurological:     Mental Status: She is oriented to person, place, and time.     ED Results / Procedures / Treatments   Labs (all labs ordered are listed, but only abnormal results are displayed) Labs Reviewed  BASIC METABOLIC PANEL - Abnormal; Notable for the following components:      Result Value   Glucose, Bld 124 (*)    BUN 5 (*)    All other components within normal limits  CBG MONITORING, ED - Abnormal; Notable for the following components:   Glucose-Capillary 121 (*)    All other components within normal limits  WET PREP, GENITAL  HCG,  SERUM, QUALITATIVE  CBC  HIV ANTIBODY (ROUTINE TESTING W REFLEX)  LIPASE, BLOOD  RPR  URINALYSIS, ROUTINE W REFLEX MICROSCOPIC  CBG MONITORING, ED  GC/CHLAMYDIA PROBE AMP () NOT AT [facility]    EKG None  Radiology No results found.  Procedures Procedures    Medications Ordered in ED Medications  HYDROcodone-acetaminophen (NORCO/VICODIN) 5-325 MG per tablet 1 tablet (1 tablet Oral Given 01/24/23 1747)    ED Course/ Medical Decision Making/ A&P                                 Medical Decision Making No vaginal bleeding visualized on exam, stable hemoglobin, VSS. Large vulvar lesion suspicious for CA vs warts - will need GYN follow up and reports she is established with [facility]. Stressed importance of follow up. Suspect lesions longstanding and not acute.  No ear infection to exam. Patient afebrile.  Stable for discharge home to outpatient follow up.   Amount and/or Complexity of Data Reviewed Labs: ordered.  Risk Prescription drug management.           Final Clinical Impression(s) / ED Diagnoses Final diagnoses:  Vulvar lesion    Rx / DC Orders ED Discharge Orders     None         Elpidio Anis, PA-C 01/24/23 1814    Alvira Monday, MD 01/25/23 1200

## 2023-01-24 NOTE — ED Triage Notes (Addendum)
Pt c/o vaginal bleedingx1wk. Pt states she is soaking more than one pad an hour. Pt c/o dizziness and weakness. Pt states she has painful bumps on her vaginax1wk. Pt c/o bilat ear painx2d.

## 2023-01-24 NOTE — ED Provider Triage Note (Signed)
Emergency Medicine Provider Triage Evaluation Note  Kelly Huang , a 38 y.o. female  was evaluated in triage.  Pt complains of vaginal bleeding.  She notes that her last Depo shot was 2 months ago. She notes that for the past month she has had constant vaginal bleeding worse over the past week.  Has associated dizziness and weakness.  Notes a previous history of iron deficiency anemia.  She is not currently taking iron supplements. Notes concerns for possible STI at this time.    Review of Systems  Positive:  Negative:   Physical Exam  BP 134/89   Pulse 85   Temp 99.1 F (37.3 C) (Oral)   Resp 16   Ht 5' (1.524 m)   Wt 60 kg   SpO2 100%   BMI 25.83 kg/m  Gen:   Awake, no distress   Resp:  Normal effort  MSK:   Moves extremities without difficulty  Other:  Diffuse abdominal tenderness to palpation. GU exam deferred in triage.   Medical Decision Making  Medically screening exam initiated at 1:03 PM.  Appropriate orders placed.  Kelly Huang was informed that the remainder of the evaluation will be completed by another provider, this initial triage assessment does not replace that evaluation, and the importance of remaining in the ED until their evaluation is complete.  Work up initiated.    Monalisa Bayless A, PA-C 01/24/23 1317

## 2023-01-24 NOTE — Discharge Instructions (Signed)
Take ibuprofen 600 mg (3 over-the-counter strength tablets) for pain. You can add Tylenol as well if needed.   It is important that you follow up with [facility] for further evaluation of the vaginal growth you have on exam. Please call to make an appointment as soon as possible.

## 2023-01-25 LAB — RPR
RPR Ser Ql: REACTIVE — AB
RPR Titer: 1:16 {titer}

## 2023-01-26 LAB — T.PALLIDUM AB, TOTAL: T Pallidum Abs: REACTIVE — AB

## 2023-02-27 ENCOUNTER — Other Ambulatory Visit: Payer: Commercial Managed Care - HMO | Admitting: Obstetrics and Gynecology

## 2023-02-27 NOTE — Patient Outreach (Signed)
RNCM received incoming call from ED CM at [facility].  Patient requesting Oxycodone from pharmacy-prescription sent over a month ago when patient was seen in [facility] ED.  Patient informed she will need to seen again since it has been over a month.  Will need to be seen by provider, Urgent Care or ED.

## 2023-07-01 ENCOUNTER — Encounter (HOSPITAL_COMMUNITY): Payer: Self-pay

## 2023-07-01 ENCOUNTER — Other Ambulatory Visit: Payer: Self-pay

## 2023-07-01 ENCOUNTER — Emergency Department (HOSPITAL_COMMUNITY)
Admission: EM | Admit: 2023-07-01 | Discharge: 2023-07-01 | Disposition: A | Discharge status: Home / Self Care | Payer: Commercial Managed Care - HMO | Attending: Emergency Medicine | Admitting: Emergency Medicine

## 2023-07-01 ENCOUNTER — Emergency Department (HOSPITAL_COMMUNITY): Payer: Commercial Managed Care - HMO

## 2023-07-01 DIAGNOSIS — Z5900 Homelessness unspecified: Secondary | ICD-10-CM | POA: Diagnosis not present

## 2023-07-01 DIAGNOSIS — R0789 Other chest pain: Secondary | ICD-10-CM | POA: Diagnosis not present

## 2023-07-01 DIAGNOSIS — R079 Chest pain, unspecified: Secondary | ICD-10-CM | POA: Diagnosis not present

## 2023-07-01 DIAGNOSIS — Z7952 Long term (current) use of systemic steroids: Secondary | ICD-10-CM | POA: Diagnosis not present

## 2023-07-01 DIAGNOSIS — R059 Cough, unspecified: Secondary | ICD-10-CM | POA: Diagnosis not present

## 2023-07-01 DIAGNOSIS — Z20822 Contact with and (suspected) exposure to covid-19: Secondary | ICD-10-CM | POA: Insufficient documentation

## 2023-07-01 DIAGNOSIS — J45909 Unspecified asthma, uncomplicated: Secondary | ICD-10-CM | POA: Diagnosis not present

## 2023-07-01 DIAGNOSIS — Z7951 Long term (current) use of inhaled steroids: Secondary | ICD-10-CM | POA: Diagnosis not present

## 2023-07-01 DIAGNOSIS — J168 Pneumonia due to other specified infectious organisms: Secondary | ICD-10-CM | POA: Diagnosis not present

## 2023-07-01 DIAGNOSIS — J189 Pneumonia, unspecified organism: Secondary | ICD-10-CM

## 2023-07-01 LAB — SARS CORONAVIRUS 2 BY RT PCR: SARS Coronavirus 2 by RT PCR: NEGATIVE

## 2023-07-01 MED ORDER — LIDOCAINE HCL (PF) 1 % IJ SOLN
INTRAMUSCULAR | Status: AC
  Administered 2023-07-01: 2 mL
  Filled 2023-07-01: qty 5

## 2023-07-01 MED ORDER — CEFTRIAXONE SODIUM 1 G IJ SOLR
1.0000 g | Freq: Once | INTRAMUSCULAR | Status: AC
  Administered 2023-07-01: 1 g via INTRAMUSCULAR
  Filled 2023-07-01: qty 10

## 2023-07-01 MED ORDER — DOXYCYCLINE HYCLATE 100 MG PO CAPS: 100.0000 mg | ORAL_CAPSULE | Freq: Two times a day (BID) | ORAL | 0 refills | Status: DC

## 2023-07-01 MED ORDER — DOXYCYCLINE HYCLATE 100 MG PO TABS
100.0000 mg | ORAL_TABLET | Freq: Once | ORAL | Status: AC
  Administered 2023-07-01: 100 mg via ORAL
  Filled 2023-07-01: qty 1

## 2023-07-01 MED ORDER — NAPROXEN 250 MG PO TABS
500.0000 mg | ORAL_TABLET | Freq: Once | ORAL | Status: AC
  Administered 2023-07-01: 500 mg via ORAL
  Filled 2023-07-01: qty 2

## 2023-07-01 NOTE — Discharge Instructions (Addendum)
Evaluation today revealed that you have pneumonia.  This is consistent with your symptoms.  I am starting on doxycycline which is an antibiotic.  Please take the entire course and follow-up with your PCP.  If your symptoms worsen, you become short of breath, worsening chest pain, uncontrolled fever or any other concerning symptom please return to the emergency department further evaluation.

## 2023-07-01 NOTE — ED Provider Notes (Signed)
[place] EMERGENCY DEPARTMENT AT [facility] Provider Note   CSN: [id number] Arrival date & time: 07/01/23  0411     History  Chief Complaint  Patient presents with   Cough   HPI Kelly Huang is a 38 y.o. female with history of cocaine use, seizures, asthma and homelessness presenting for cough.  States it started a week ago.  Cough is nonproductive.  Does report fevers at home.  Also mention that she had some chest pain just below her left breast.  Feels sharp and is worse with movement and coughing.  It is nonradiating.  Also states that she feels she has mucus in her chest that she cannot get out.  Mention that she did "smoke crack yesterday.  States that this time chest pain has resolved but coughing has been persistent.  Denies shortness of breath.  Denies OCP use, calf tenderness or swelling.  Patient also asking for something to eat and drink.   Cough      Home Medications Prior to Admission medications   Medication Sig Start Date End Date Taking? Authorizing Provider  doxycycline (VIBRAMYCIN) 100 MG capsule Take 1 capsule (100 mg total) by mouth 2 (two) times daily. 07/01/23  Yes Gareth Eagle, PA-C  albuterol (VENTOLIN HFA) 108 (90 Base) MCG/ACT inhaler Inhale 2 puffs into the lungs every 6 (six) hours as needed for wheezing or shortness of breath. 06/01/22   Dahal, Melina Schools, MD  albuterol (VENTOLIN HFA) 108 (90 Base) MCG/ACT inhaler Inhale 2 puffs into the lungs every 4 (four) hours as needed for wheezing or shortness of breath. 11/20/22   Roxy Horseman, PA-C  azithromycin (ZITHROMAX) 250 MG tablet Take 1 tablet (250 mg total) by mouth daily. Take first 2 tablets together, then 1 every day until finished. 11/20/22   Roxy Horseman, PA-C  benzonatate (TESSALON) 100 MG capsule Take 1 capsule (100 mg total) by mouth 2 (two) times daily as needed for cough. 11/20/22   Roxy Horseman, PA-C  HYDROcodone-acetaminophen (NORCO/VICODIN) 5-325 MG tablet Take 2 tablets  by mouth every 4 (four) hours as needed. 01/24/23   Alvira Monday, MD  levofloxacin (LEVAQUIN) 500 MG tablet Take 1 tablet (500 mg total) by mouth daily. Patient not taking: Reported on 05/29/2022 05/28/22   Darrick Grinder, PA-C  predniSONE (DELTASONE) 20 MG tablet Take 2 tablets (40 mg total) by mouth daily. 11/20/22   Roxy Horseman, PA-C      Allergies    Bactrim [sulfamethoxazole-trimethoprim]    Review of Systems   Review of Systems  Respiratory:  Positive for cough.     Physical Exam Updated Vital Signs BP 105/71   Pulse 85   Temp 98.3 F (36.8 C) (Oral)   Resp 16   LMP 06/29/2023 (Exact Date)   SpO2 97%  Physical Exam Vitals and nursing note reviewed.  HENT:     Head: Normocephalic and atraumatic.     Mouth/Throat:     Mouth: Mucous membranes are moist.  Eyes:     General:        Right eye: No discharge.        Left eye: No discharge.     Conjunctiva/sclera: Conjunctivae normal.  Cardiovascular:     Rate and Rhythm: Normal rate and regular rhythm.     Pulses: Normal pulses.     Heart sounds: Normal heart sounds.  Pulmonary:     Effort: Pulmonary effort is normal. No respiratory distress.     Breath sounds: Normal breath sounds.  No wheezing, rhonchi or rales.  Abdominal:     General: Abdomen is flat.     Palpations: Abdomen is soft.  Skin:    General: Skin is warm and dry.  Neurological:     General: No focal deficit present.  Psychiatric:        Mood and Affect: Mood normal.     ED Results / Procedures / Treatments   Labs (all labs ordered are listed, but only abnormal results are displayed) Labs Reviewed  SARS CORONAVIRUS 2 BY RT PCR    EKG EKG Interpretation Date/Time:  Sunday July 01 2023 05:31:55 EST Ventricular Rate:  83 PR Interval:  168 QRS Duration:  88 QT Interval:  352 QTC Calculation: 413 R Axis:   88  Text Interpretation: Normal sinus rhythm Septal infarct , age undetermined No significant change since last tracing When  compared with ECG of 20-Nov-2022 20:01, PREVIOUS ECG IS PRESENT Confirmed by Plunkett, Whitney ([id number]) on 07/01/2023 10:05:52 AM  Radiology DG Chest 2 View Result Date: 07/01/2023 CLINICAL DATA:  Chest pain and nonproductive cough.  Fever. EXAM: CHEST - 2 VIEW COMPARISON:  None Available. FINDINGS: The heart size and mediastinal contours are within normal limits. Poorly defined airspace opacity is seen in the lingula, suspicious for pneumonia. Right lung is clear. IMPRESSION: Lingular airspace opacity, suspicious for pneumonia. Recommend continued chest radiographic follow-up to confirm resolution. Electronically Signed   By: Akiba Melfi A Stahl M.D.   On: 07/01/2023 10:27    Procedures Procedures    Medications Ordered in ED Medications  cefTRIAXone (ROCEPHIN) injection 1 g (has no administration in time range)  doxycycline (VIBRA-TABS) tablet 100 mg (has no administration in time range)  naproxen (NAPROSYN) tablet 500 mg (500 mg Oral Given 07/01/23 0525)    ED Course/ Medical Decision Making/ A&P Clinical Course as of 07/01/23 1106  Sun Jul 01, 2023  1032 EKG 12-Lead [JR]    Clinical Course User Index [JR] Wacey Zieger K, PA-C                                 Medical Decision Making Amount and/or Complexity of Data Reviewed ECG/medicine tests:  Decision-making details documented in ED Course.   37 year old well-appearing female presenting for cough and chest pain.  Exam was unremarkable.  DDx includes pneumonia, ACS, PE, COPD exacerbation, other.  Considered ACS but unlikely given atypical chest pain that is now resolved and EKG reassuring.  Doubt PE given no chest pain at this time and lack of risk factors.  Suspect chest pain is likely costochondritis or of other MSK etiology.  I personally reviewed and interpreted x-ray which revealed a lingular airspace opacities concerning for pneumonia.  Symptoms are consistent with the findings on x-ray.  Treating for community-acquired pneumonia  with 1 dose of ceftriaxone and will start her on doxycycline.  Advised her to follow-up with her PCP.  Discussed pertinent return precautions.  Discharged in good condition.        Final Clinical Impression(s) / ED Diagnoses Final diagnoses:  Pneumonia due to infectious organism, unspecified laterality, unspecified part of lung    Rx / DC Orders ED Discharge Orders          Ordered    doxycycline (VIBRAMYCIN) 100 MG capsule  2 times daily        01 /26/25 1106              Gareth Eagle, PA-C  07/01/23 1108    Gwyneth Sprout, MD 07/02/23 1500

## 2023-07-01 NOTE — ED Provider Triage Note (Signed)
Emergency Medicine Provider Triage Evaluation Note  Kelly Huang , a 38 y.o. female  was evaluated in triage.  Pt complains of chest pain under her breasts. Began on the right, now present on both sides. Feels like she has mucous in her chest that she can't get out. Notes associated dry cough, subjective fever. Did smoke crack yesterday. Smoking cigarettes also aggravates her pain. Was sleeping in her car when EMS arrived.  Review of Systems  Positive: As above Negative: As above  Physical Exam  BP 105/71   Pulse 85   Temp 98.3 F (36.8 C) (Oral)   Resp 16   SpO2 97%  Gen:   Awake, no distress   Resp:  Normal effort  MSK:   Moves extremities without difficulty  Other:  Intact distal pulses.  Medical Decision Making  Medically screening exam initiated at 4:57 AM.  Appropriate orders placed.  Kelly Huang was informed that the remainder of the evaluation will be completed by another provider, this initial triage assessment does not replace that evaluation, and the importance of remaining in the ED until their evaluation is complete.  Nonspecific chest pain - associated cough and congestion suggestive of viral illness, potential costochondritis. Pending CXR, EKG, COVID swab. Naproxen ordered for pain.   Antony Madura, PA-C 07/01/23 [phone]

## 2023-07-01 NOTE — ED Triage Notes (Signed)
Pt comes via [facility] for cough that has been going on for a week, nonproductive, reports fevers at home.

## 2023-11-02 ENCOUNTER — Emergency Department (HOSPITAL_COMMUNITY)
Admission: EM | Admit: 2023-11-02 | Discharge: 2023-11-03 | Disposition: A | Discharge status: Home / Self Care | Attending: Emergency Medicine | Admitting: Emergency Medicine

## 2023-11-02 ENCOUNTER — Encounter (HOSPITAL_COMMUNITY): Payer: Self-pay | Admitting: *Deleted

## 2023-11-02 ENCOUNTER — Other Ambulatory Visit: Payer: Self-pay

## 2023-11-02 DIAGNOSIS — F17200 Nicotine dependence, unspecified, uncomplicated: Secondary | ICD-10-CM | POA: Diagnosis not present

## 2023-11-02 DIAGNOSIS — J45909 Unspecified asthma, uncomplicated: Secondary | ICD-10-CM | POA: Insufficient documentation

## 2023-11-02 DIAGNOSIS — N390 Urinary tract infection, site not specified: Secondary | ICD-10-CM | POA: Insufficient documentation

## 2023-11-02 DIAGNOSIS — R350 Frequency of micturition: Secondary | ICD-10-CM | POA: Diagnosis present

## 2023-11-02 LAB — COMPREHENSIVE METABOLIC PANEL WITH GFR
ALT: 12 U/L (ref 0–44)
AST: 18 U/L (ref 15–41)
Albumin: 3 g/dL — ABNORMAL LOW (ref 3.5–5.0)
Alkaline Phosphatase: 47 U/L (ref 38–126)
Anion gap: 10 (ref 5–15)
BUN: 9 mg/dL (ref 6–20)
CO2: 24 mmol/L (ref 22–32)
Calcium: 8.7 mg/dL — ABNORMAL LOW (ref 8.9–10.3)
Chloride: 106 mmol/L (ref 98–111)
Creatinine, Ser: 1.04 mg/dL — ABNORMAL HIGH (ref 0.44–1.00)
GFR, Estimated: >60 mL/min (ref >60)
Glucose, Bld: 84 mg/dL (ref 70–99)
Potassium: 4 mmol/L (ref 3.5–5.1)
Sodium: 140 mmol/L (ref 135–145)
Total Bilirubin: 0.2 mg/dL (ref 0.0–1.2)
Total Protein: 6.1 g/dL — ABNORMAL LOW (ref 6.5–8.1)

## 2023-11-02 LAB — URINALYSIS, ROUTINE W REFLEX MICROSCOPIC
Bacteria, UA: NONE SEEN
Bilirubin Urine: NEGATIVE
Glucose, UA: NEGATIVE mg/dL
Ketones, ur: NEGATIVE mg/dL
Nitrite: NEGATIVE
Protein, ur: 30 mg/dL — AB
Specific Gravity, Urine: 1.006 (ref 1.005–1.030)
WBC, UA: >50 WBC/hpf (ref 0–5)
pH: 7 (ref 5.0–8.0)

## 2023-11-02 LAB — HCG, SERUM, QUALITATIVE: Preg, Serum: NEGATIVE

## 2023-11-02 LAB — CBC
HCT: 34.2 % — ABNORMAL LOW (ref 36.0–46.0)
Hemoglobin: 10.1 g/dL — ABNORMAL LOW (ref 12.0–15.0)
MCH: 23.1 pg — ABNORMAL LOW (ref 26.0–34.0)
MCHC: 29.5 g/dL — ABNORMAL LOW (ref 30.0–36.0)
MCV: 78.3 fL — ABNORMAL LOW (ref 80.0–100.0)
Platelets: 425 10*3/uL — ABNORMAL HIGH (ref 150–400)
RBC: 4.37 MIL/uL (ref 3.87–5.11)
RDW: 15.8 % — ABNORMAL HIGH (ref 11.5–15.5)
WBC: 8.6 10*3/uL (ref 4.0–10.5)
nRBC: 0 % (ref 0.0–0.2)

## 2023-11-02 LAB — LIPASE, BLOOD: Lipase: 32 U/L (ref 11–51)

## 2023-11-02 MED ORDER — OXYCODONE-ACETAMINOPHEN 5-325 MG PO TABS
1.0000 | ORAL_TABLET | Freq: Once | ORAL | Status: AC
  Administered 2023-11-02: 1 via ORAL
  Filled 2023-11-02: qty 1

## 2023-11-02 NOTE — ED Triage Notes (Signed)
 Pt c/o lower back pain for 3 days frequent urination  no blood seen  lmp may 15

## 2023-11-03 ENCOUNTER — Emergency Department (HOSPITAL_COMMUNITY)

## 2023-11-03 DIAGNOSIS — N390 Urinary tract infection, site not specified: Secondary | ICD-10-CM | POA: Diagnosis not present

## 2023-11-03 MED ORDER — KETOROLAC TROMETHAMINE 15 MG/ML IJ SOLN
15.0000 mg | Freq: Once | INTRAMUSCULAR | Status: AC
  Administered 2023-11-03: 15 mg via INTRAMUSCULAR
  Filled 2023-11-03: qty 1

## 2023-11-03 MED ORDER — FOSFOMYCIN TROMETHAMINE 3 G PO PACK
3.0000 g | PACK | Freq: Once | ORAL | Status: AC
  Administered 2023-11-03: 3 g via ORAL
  Filled 2023-11-03: qty 3

## 2023-11-03 NOTE — ED Notes (Signed)
Pt provided lunch bag per request

## 2023-11-03 NOTE — ED Provider Notes (Signed)
 [place] EMERGENCY DEPARTMENT AT [facility] Provider Note   CSN: [id number] Arrival date & time: 11/02/23  2128     History  Chief Complaint  Patient presents with   Back Pain    Kelly Huang is a 38 y.o. female.  Patient presents to the emergency room complaining of bilateral back pain for the past 3 days with increased urinary frequency.  She states she is not currently on her menstrual cycle.  She denies abdominal pain, nausea, vomiting, shortness of breath.  Past medical history significant for herniated lumbar disc, asthma, cocaine abuse   Back Pain      Home Medications Prior to Admission medications   Medication Sig Start Date End Date Taking? Authorizing Provider  albuterol  (VENTOLIN  HFA) 108 (90 Base) MCG/ACT inhaler Inhale 2 puffs into the lungs every 6 (six) hours as needed for wheezing or shortness of breath. 06/01/22   Hoyt Macleod, MD  albuterol  (VENTOLIN  HFA) 108 (90 Base) MCG/ACT inhaler Inhale 2 puffs into the lungs every 4 (four) hours as needed for wheezing or shortness of breath. 11/20/22   Sherel Dikes, PA-C  azithromycin  (ZITHROMAX ) 250 MG tablet Take 1 tablet (250 mg total) by mouth daily. Take first 2 tablets together, then 1 every day until finished. 11/20/22   Sherel Dikes, PA-C  benzonatate  (TESSALON ) 100 MG capsule Take 1 capsule (100 mg total) by mouth 2 (two) times daily as needed for cough. 11/20/22   Sherel Dikes, PA-C  doxycycline  (VIBRAMYCIN ) 100 MG capsule Take 1 capsule (100 mg total) by mouth 2 (two) times daily. 07/01/23   Robinson, John K, PA-C  HYDROcodone -acetaminophen  (NORCO/VICODIN) 5-325 MG tablet Take 2 tablets by mouth every 4 (four) hours as needed. 01/24/23   Scarlette Currier, MD  levofloxacin  (LEVAQUIN ) 500 MG tablet Take 1 tablet (500 mg total) by mouth daily. Patient not taking: Reported on 05/29/2022 05/28/22   Elisa Guest, PA-C  predniSONE  (DELTASONE ) 20 MG tablet Take 2 tablets (40 mg total) by mouth  daily. 11/20/22   Sherel Dikes, PA-C      Allergies    Bactrim [sulfamethoxazole-trimethoprim]    Review of Systems   Review of Systems  Musculoskeletal:  Positive for back pain.    Physical Exam Updated Vital Signs BP 118/79   Pulse 73   Temp 98.5 F (36.9 C)   Resp 16   Ht 5' (1.524 m)   Wt 60 kg   LMP 10/18/2023   SpO2 100%   BMI 25.83 kg/m  Physical Exam Vitals and nursing note reviewed.  Constitutional:      General: She is not in acute distress.    Appearance: She is well-developed.  HENT:     Head: Normocephalic and atraumatic.  Eyes:     Conjunctiva/sclera: Conjunctivae normal.  Cardiovascular:     Rate and Rhythm: Normal rate and regular rhythm.     Heart sounds: No murmur heard. Pulmonary:     Effort: Pulmonary effort is normal. No respiratory distress.     Breath sounds: Normal breath sounds.  Abdominal:     Palpations: Abdomen is soft.     Tenderness: There is no abdominal tenderness. There is right CVA tenderness.  Musculoskeletal:        General: Tenderness (Bilateral back tenderness in the lumbar region with no midline spinal tenderness) present. No swelling.     Cervical back: Neck supple.  Skin:    General: Skin is warm and dry.     Capillary Refill: Capillary refill  takes less than 2 seconds.  Neurological:     Mental Status: She is alert.     Motor: No weakness.  Psychiatric:        Mood and Affect: Mood normal.     ED Results / Procedures / Treatments   Labs (all labs ordered are listed, but only abnormal results are displayed) Labs Reviewed  COMPREHENSIVE METABOLIC PANEL WITH GFR - Abnormal; Notable for the following components:      Result Value   Creatinine, Ser 1.04 (*)    Calcium  8.7 (*)    Total Protein 6.1 (*)    Albumin 3.0 (*)    All other components within normal limits  CBC - Abnormal; Notable for the following components:   Hemoglobin 10.1 (*)    HCT 34.2 (*)    MCV 78.3 (*)    MCH 23.1 (*)    MCHC 29.5 (*)     RDW 15.8 (*)    Platelets 425 (*)    All other components within normal limits  URINALYSIS, ROUTINE W REFLEX MICROSCOPIC - Abnormal; Notable for the following components:   APPearance CLOUDY (*)    Hgb urine dipstick SMALL (*)    Protein, ur 30 (*)    Leukocytes,Ua LARGE (*)    Non Squamous Epithelial 0-5 (*)    All other components within normal limits  LIPASE, BLOOD  HCG, SERUM, QUALITATIVE    EKG None  Radiology CT Renal Stone Study Result Date: 11/03/2023 EXAM: CT ABDOMEN AND PELVIS WITHOUT CONTRAST 11/03/2023 03:30:02 AM TECHNIQUE: CT of the abdomen and pelvis was performed without the administration of intravenous contrast. Multiplanar reformatted images are provided for review. Automated exposure control, iterative reconstruction, and/or weight based adjustment of the mA/kV was utilized to reduce the radiation dose to as low as reasonably achievable. COMPARISON: None available. CLINICAL HISTORY: Abdominal/flank pain, stone suspected. Chief complaints; Back Pain; CT Renal Stone Study; Abdominal/flank pain, stone suspected. FINDINGS: LOWER CHEST: Mild subpleural scarring in the left lower lobe. LIVER: The liver is unremarkable. GALLBLADDER AND BILE DUCTS: Gallbladder is unremarkable. No biliary ductal dilatation. SPLEEN: No acute abnormality. PANCREAS: No acute abnormality. ADRENAL GLANDS: No acute abnormality. 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 Calcified pelvic phleboliths, without definite ureteral calculus. No perinephric or periureteral stranding. Urinary bladder is unremarkable. GI AND BOWEL: The appendix is not discretely visualized. Stomach demonstrates no acute abnormality. There is no bowel obstruction. PERITONEUM AND RETROPERITONEUM: No ascites. No free air. VASCULATURE: Aorta is normal in caliber. LYMPH NODES: No lymphadenopathy. REPRODUCTIVE ORGANS: No acute abnormality. BONES AND SOFT TISSUES: No  acute osseous abnormality. No focal soft tissue abnormality. IMPRESSION: 1. 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 2. Otherwise, no acute abnormality. Electronically signed by: Zadie Herter MD 11/03/2023 03:37 AM EDT RP Workstation: [id number]    Procedures Procedures    Medications Ordered in ED Medications  fosfomycin (MONUROL) packet 3 g (has no administration in time range)  oxyCODONE -acetaminophen  (PERCOCET/ROXICET) 5-325 MG per tablet 1 tablet (1 tablet Oral Given 11/02/23 2201)  ketorolac  (TORADOL ) 15 MG/ML injection 15 mg (15 mg Intramuscular Given 11/03/23 0437)    ED Course/ Medical Decision Making/ A&P                                 Medical Decision Making Amount and/or Complexity of Data Reviewed Radiology: ordered.   This patient presents to  the ED for concern of back pain, this involves an extensive number of treatment options, and is a complaint that carries with it a high risk of complications and morbidity.  The differential diagnosis includes nephrolithiasis, hydronephrosis, pyelonephritis, musculoskeletal pain, others   Co morbidities / Chronic conditions that complicate the patient evaluation  History of herniated lumbar disc   Additional history obtained:  Additional history obtained from EMR   Lab Tests:  I Ordered, and personally interpreted labs.  The pertinent results include: Hemoglobin 10.1 with a microcytic anemia pattern, UA with small hemoglobin, large leukocytes, greater than 50 WBC  Imaging Studies ordered:  I ordered imaging studies including CT renal stone study I independently visualized and interpreted imaging which showed  1. 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  2. Otherwise, no acute abnormality.   I agree with the radiologist interpretation   Problem List / ED Course / Critical interventions / Medication management   I  ordered medication including percocet , fosfomycin Reevaluation of the patient after these medicines showed that the patient improved I have reviewed the patients home medicines and have made adjustments as needed    Social Determinants of Health:  Patient is a daily smoker   Test / Admission - Considered:  Patient with grossly unremarkable workup.  She does have large leukocytes, greater than 50 WBCs on urinalysis.  Plan to treat for cystitis.  Patient has been sleeping throughout the encounter.  Pain has been under control.  She is drinking without difficulty.  At this time I see no indication for further emergent workup.  Patient appears stable for discharge home.         Final Clinical Impression(s) / ED Diagnoses Final diagnoses:  Lower urinary tract infectious disease    Rx / DC Orders ED Discharge Orders     None         Delories Fetter 11/03/23 0516    Eldon Greenland, MD 11/03/23 [phone]

## 2023-11-03 NOTE — Discharge Instructions (Signed)
 You were seen this evening for low back pain.  This may be musculoskeletal in nature.  You have lab work that shows a urinary tract infection.  You were treated with antibiotics here in the hospital.

## 2023-11-11 ENCOUNTER — Emergency Department (HOSPITAL_COMMUNITY)
Admission: EM | Admit: 2023-11-11 | Discharge: 2023-11-11 | Disposition: A | Discharge status: Against Medical Advice | Attending: Emergency Medicine | Admitting: Emergency Medicine

## 2023-11-11 ENCOUNTER — Other Ambulatory Visit: Payer: Self-pay

## 2023-11-11 ENCOUNTER — Encounter (HOSPITAL_COMMUNITY): Payer: Self-pay

## 2023-11-11 DIAGNOSIS — R3915 Urgency of urination: Secondary | ICD-10-CM | POA: Diagnosis present

## 2023-11-11 DIAGNOSIS — M545 Low back pain, unspecified: Secondary | ICD-10-CM | POA: Diagnosis not present

## 2023-11-11 DIAGNOSIS — Z5321 Procedure and treatment not carried out due to patient leaving prior to being seen by health care provider: Secondary | ICD-10-CM | POA: Insufficient documentation

## 2023-11-11 DIAGNOSIS — R509 Fever, unspecified: Secondary | ICD-10-CM | POA: Diagnosis not present

## 2023-11-11 DIAGNOSIS — R350 Frequency of micturition: Secondary | ICD-10-CM | POA: Insufficient documentation

## 2023-11-11 DIAGNOSIS — R300 Dysuria: Secondary | ICD-10-CM | POA: Insufficient documentation

## 2023-11-11 LAB — URINALYSIS, ROUTINE W REFLEX MICROSCOPIC
Bilirubin Urine: NEGATIVE
Glucose, UA: NEGATIVE mg/dL
Ketones, ur: NEGATIVE mg/dL
Nitrite: NEGATIVE
Protein, ur: 100 mg/dL — AB
Specific Gravity, Urine: 1.011 (ref 1.005–1.030)
WBC, UA: >50 WBC/hpf (ref 0–5)
pH: 6 (ref 5.0–8.0)

## 2023-11-11 LAB — HCG, SERUM, QUALITATIVE: Preg, Serum: NEGATIVE

## 2023-11-11 NOTE — ED Provider Triage Note (Cosign Needed)
 Emergency Medicine Provider Triage Evaluation Note  Kelly Huang , a 38 y.o. female  was evaluated in triage.  Pt complains of urinary urgency and frequency with dysuria but no hematuria.  Reports some bilateral lower back pain.  Was seen here previously and was treated but she reports that her symptoms have not improved.  Denies that her symptoms have worsened however.  Reports some subjective fevers from feeling hot but no chills or recorded temperature.  No nausea, vomiting, diarrhea, constipation. Denies any belly pain  Review of Systems  Positive:  Negative:   Physical Exam  BP (!) 132/94   Pulse 80   Temp 98.8 F (37.1 C) (Oral)   Resp 17   Ht 5' (1.524 m)   Wt 60 kg   LMP 10/18/2023   SpO2 100%   BMI 25.83 kg/m  Gen:   Awake, no distress   Resp:  Normal effort  MSK:   Moves extremities without difficulty  Other:  Ambulatory with independent steady gait  Medical Decision Making  Medically screening exam initiated at 4:10 PM.  Appropriate orders placed.  Kelly Huang was informed that the remainder of the evaluation will be completed by another provider, this initial triage assessment does not replace that evaluation, and the importance of remaining in the ED until their evaluation is complete.  Urine and labs ordered. Patient with recent CT scan on 11/03/23.   Spence Dux, [place] 11/11/23 9604

## 2023-11-11 NOTE — ED Triage Notes (Signed)
 Pt reports BIL flank pain and urinary urgency x1 week. Recently seen for same. Pt states that sxs have not improved.

## 2023-11-14 LAB — URINE CULTURE: Culture: >=100000 — AB

## 2023-11-15 ENCOUNTER — Telehealth (HOSPITAL_BASED_OUTPATIENT_CLINIC_OR_DEPARTMENT_OTHER): Payer: Self-pay | Admitting: *Deleted

## 2023-11-15 NOTE — Telephone Encounter (Signed)
 Post ED Visit - Positive Culture Follow-up: Unsuccessful Patient Follow-up  Culture assessed and recommendations reviewed by:  [x]  Barbra Boone, Pharm.D. []  Skeet Duke, Pharm.D., BCPS AQ-ID []  Leslee Rase, Pharm.D., BCPS []  Garland Junk, [address].D., BCPS []  [place], [address].D., BCPS, AAHIVP []  Alcide Aly, Pharm.D., BCPS, AAHIVP []  Alejo Hurter, PharmD []  Thomasine Flick, PharmD, BCPS  Positive urine culture  [x]  Patient discharged without antimicrobial prescription and treatment is now indicated []  Organism is resistant to prescribed ED discharge antimicrobial []  Patient with positive blood cultures  Pt w/ urinary urgency & frequency and bilateral lower back pain.  Was seen for similar symptoms on 11/03/23; given 1 x dose of fosfomycin.  UA with leukocytes, >50 WBC.  Was never seen by a provider, but with urinary symptoms will send a prescription for cefadroxil 500 mg twice daily x 5 days, per Dr. Rupert Counts & Barbra Boone, PharmD. Unable to contact patient after 3 attempts, letter will be sent to address on file  Kelly Huang 11/15/2023, 11:10 AM

## 2023-11-15 NOTE — Progress Notes (Signed)
 ED Antimicrobial Stewardship Positive Culture Follow Up   Kelly Huang is an 38 y.o. female who presented to [facility] on 11/11/2023 with a chief complaint of  Chief Complaint  Patient presents with   Urinary Frequency    Recent Results (from the past 720 hours)  Urine Culture     Status: Abnormal   Collection Time: 11/11/23  3:19 PM   Specimen: Urine, Clean Catch  Result Value Ref Range Status   Specimen Description URINE, CLEAN CATCH  Final   Special Requests   Final    NONE Performed at [facility], [address], [place], [place] [address]    Culture >=100,000 COLONIES/mL ESCHERICHIA COLI (A)  Final   Report Status 11/14/2023 FINAL  Final   Organism ID, Bacteria ESCHERICHIA COLI (A)  Final      Susceptibility   Escherichia coli - MIC*    AMPICILLIN >=32 RESISTANT Resistant     CEFAZOLIN 8 SENSITIVE Sensitive     CEFEPIME <=0.12 SENSITIVE Sensitive     CEFTRIAXONE  <=0.25 SENSITIVE Sensitive     CIPROFLOXACIN <=0.25 SENSITIVE Sensitive     GENTAMICIN >=16 RESISTANT Resistant     IMIPENEM <=0.25 SENSITIVE Sensitive     NITROFURANTOIN <=16 SENSITIVE Sensitive     TRIMETH/SULFA >=320 RESISTANT Resistant     AMPICILLIN/SULBACTAM >=32 RESISTANT Resistant     PIP/TAZO 64 INTERMEDIATE Intermediate ug/mL    * >=100,000 COLONIES/mL ESCHERICHIA COLI    38 yo female presented with urinary urgency and frequency plus flank pain. Was seen for similar symptoms on 5/31 and given 1 dose of fosfomycin. UA showed leukocytes and > 50 WBC. Was never seen by a provider, seems to have left before she was seen but with urinary symptoms and positive culture will sent a prescription for cefadroxil  New antibiotic prescription: Cefadroxil 500 mg twice daily for 5 days  ED Provider: Sofie Dunning PA   Clayborn Cunas 11/15/2023, 10:04 AM Clinical Pharmacist Monday - Friday phone -  [phone] Saturday - Sunday phone - [phone]

## 2023-11-17 ENCOUNTER — Inpatient Hospital Stay (HOSPITAL_COMMUNITY)
Admission: EM | Admit: 2023-11-17 | Discharge: 2023-11-21 | DRG: 871 | Disposition: A | Discharge status: Home / Self Care | Attending: Family Medicine | Admitting: Family Medicine

## 2023-11-17 ENCOUNTER — Emergency Department (HOSPITAL_COMMUNITY)

## 2023-11-17 ENCOUNTER — Encounter (HOSPITAL_COMMUNITY): Payer: Self-pay

## 2023-11-17 ENCOUNTER — Other Ambulatory Visit: Payer: Self-pay

## 2023-11-17 DIAGNOSIS — R197 Diarrhea, unspecified: Secondary | ICD-10-CM | POA: Diagnosis present

## 2023-11-17 DIAGNOSIS — D509 Iron deficiency anemia, unspecified: Secondary | ICD-10-CM | POA: Diagnosis present

## 2023-11-17 DIAGNOSIS — F17210 Nicotine dependence, cigarettes, uncomplicated: Secondary | ICD-10-CM | POA: Diagnosis present

## 2023-11-17 DIAGNOSIS — R09A2 Foreign body sensation, throat: Secondary | ICD-10-CM | POA: Diagnosis present

## 2023-11-17 DIAGNOSIS — F1090 Alcohol use, unspecified, uncomplicated: Secondary | ICD-10-CM | POA: Diagnosis present

## 2023-11-17 DIAGNOSIS — Z881 Allergy status to other antibiotic agents status: Secondary | ICD-10-CM | POA: Diagnosis not present

## 2023-11-17 DIAGNOSIS — F1490 Cocaine use, unspecified, uncomplicated: Secondary | ICD-10-CM | POA: Diagnosis present

## 2023-11-17 DIAGNOSIS — A419 Sepsis, unspecified organism: Principal | ICD-10-CM | POA: Diagnosis present

## 2023-11-17 DIAGNOSIS — S2231XA Fracture of one rib, right side, initial encounter for closed fracture: Secondary | ICD-10-CM | POA: Diagnosis present

## 2023-11-17 DIAGNOSIS — N12 Tubulo-interstitial nephritis, not specified as acute or chronic: Secondary | ICD-10-CM | POA: Diagnosis not present

## 2023-11-17 DIAGNOSIS — J9811 Atelectasis: Secondary | ICD-10-CM | POA: Diagnosis present

## 2023-11-17 DIAGNOSIS — X58XXXA Exposure to other specified factors, initial encounter: Secondary | ICD-10-CM | POA: Diagnosis present

## 2023-11-17 DIAGNOSIS — N151 Renal and perinephric abscess: Secondary | ICD-10-CM | POA: Diagnosis present

## 2023-11-17 DIAGNOSIS — N10 Acute tubulo-interstitial nephritis: Secondary | ICD-10-CM | POA: Diagnosis present

## 2023-11-17 DIAGNOSIS — B9620 Unspecified Escherichia coli [E. coli] as the cause of diseases classified elsewhere: Secondary | ICD-10-CM | POA: Diagnosis present

## 2023-11-17 DIAGNOSIS — Z789 Other specified health status: Secondary | ICD-10-CM

## 2023-11-17 DIAGNOSIS — J45909 Unspecified asthma, uncomplicated: Secondary | ICD-10-CM | POA: Diagnosis present

## 2023-11-17 DIAGNOSIS — F1990 Other psychoactive substance use, unspecified, uncomplicated: Secondary | ICD-10-CM | POA: Diagnosis present

## 2023-11-17 DIAGNOSIS — M542 Cervicalgia: Secondary | ICD-10-CM

## 2023-11-17 LAB — URINALYSIS, ROUTINE W REFLEX MICROSCOPIC
Bilirubin Urine: NEGATIVE
Glucose, UA: NEGATIVE mg/dL
Ketones, ur: NEGATIVE mg/dL
Nitrite: POSITIVE — AB
Protein, ur: 30 mg/dL — AB
RBC / HPF: >50 RBC/hpf (ref 0–5)
Specific Gravity, Urine: 1.01 (ref 1.005–1.030)
WBC, UA: >50 WBC/hpf (ref 0–5)
pH: 6 (ref 5.0–8.0)

## 2023-11-17 LAB — CBC
HCT: 36.2 % (ref 36.0–46.0)
Hemoglobin: 10.8 g/dL — ABNORMAL LOW (ref 12.0–15.0)
MCH: 22.7 pg — ABNORMAL LOW (ref 26.0–34.0)
MCHC: 29.8 g/dL — ABNORMAL LOW (ref 30.0–36.0)
MCV: 76.1 fL — ABNORMAL LOW (ref 80.0–100.0)
Platelets: 503 10*3/uL — ABNORMAL HIGH (ref 150–400)
RBC: 4.76 MIL/uL (ref 3.87–5.11)
RDW: 16.7 % — ABNORMAL HIGH (ref 11.5–15.5)
WBC: 12.9 10*3/uL — ABNORMAL HIGH (ref 4.0–10.5)
nRBC: 0 % (ref 0.0–0.2)

## 2023-11-17 LAB — COMPREHENSIVE METABOLIC PANEL WITH GFR
ALT: 15 U/L (ref 0–44)
AST: 22 U/L (ref 15–41)
Albumin: 3.2 g/dL — ABNORMAL LOW (ref 3.5–5.0)
Alkaline Phosphatase: 50 U/L (ref 38–126)
Anion gap: 13 (ref 5–15)
BUN: 6 mg/dL (ref 6–20)
CO2: 22 mmol/L (ref 22–32)
Calcium: 8.7 mg/dL — ABNORMAL LOW (ref 8.9–10.3)
Chloride: 99 mmol/L (ref 98–111)
Creatinine, Ser: 0.94 mg/dL (ref 0.44–1.00)
GFR, Estimated: >60 mL/min (ref >60)
Glucose, Bld: 137 mg/dL — ABNORMAL HIGH (ref 70–99)
Potassium: 4 mmol/L (ref 3.5–5.1)
Sodium: 134 mmol/L — ABNORMAL LOW (ref 135–145)
Total Bilirubin: 0.7 mg/dL (ref 0.0–1.2)
Total Protein: 7.4 g/dL (ref 6.5–8.1)

## 2023-11-17 LAB — RAPID URINE DRUG SCREEN, HOSP PERFORMED
Amphetamines: NOT DETECTED
Barbiturates: NOT DETECTED
Benzodiazepines: NOT DETECTED
Cocaine: POSITIVE — AB
Opiates: NOT DETECTED
Tetrahydrocannabinol: NOT DETECTED

## 2023-11-17 LAB — HCG, SERUM, QUALITATIVE: Preg, Serum: NEGATIVE

## 2023-11-17 LAB — LIPASE, BLOOD: Lipase: 24 U/L (ref 11–51)

## 2023-11-17 MED ORDER — SODIUM CHLORIDE 0.9 % IV SOLN
2.0000 g | INTRAVENOUS | Status: DC
  Administered 2023-11-18 – 2023-11-20 (×3): 2 g via INTRAVENOUS
  Filled 2023-11-17 (×3): qty 20

## 2023-11-17 MED ORDER — SODIUM CHLORIDE 0.9 % IV SOLN
1.0000 g | Freq: Once | INTRAVENOUS | Status: AC
  Administered 2023-11-17: 1 g via INTRAVENOUS
  Filled 2023-11-17: qty 10

## 2023-11-17 MED ORDER — IOHEXOL 350 MG/ML SOLN
75.0000 mL | Freq: Once | INTRAVENOUS | Status: AC | PRN
  Administered 2023-11-17: 75 mL via INTRAVENOUS

## 2023-11-17 MED ORDER — FOLIC ACID 1 MG PO TABS
1.0000 mg | ORAL_TABLET | Freq: Every day | ORAL | Status: DC
  Administered 2023-11-18 – 2023-11-21 (×4): 1 mg via ORAL
  Filled 2023-11-17 (×4): qty 1

## 2023-11-17 MED ORDER — ENOXAPARIN SODIUM 40 MG/0.4ML IJ SOSY
40.0000 mg | PREFILLED_SYRINGE | INTRAMUSCULAR | Status: DC
  Administered 2023-11-17 – 2023-11-20 (×4): 40 mg via SUBCUTANEOUS
  Filled 2023-11-17 (×4): qty 0.4

## 2023-11-17 MED ORDER — HYDROMORPHONE HCL 1 MG/ML IJ SOLN
1.0000 mg | INTRAMUSCULAR | Status: DC | PRN
  Administered 2023-11-17 – 2023-11-20 (×11): 1 mg via INTRAVENOUS
  Filled 2023-11-17 (×11): qty 1

## 2023-11-17 MED ORDER — ACETAMINOPHEN 650 MG RE SUPP
650.0000 mg | Freq: Once | RECTAL | Status: AC
  Administered 2023-11-17: 650 mg via RECTAL
  Filled 2023-11-17: qty 1

## 2023-11-17 MED ORDER — ONDANSETRON HCL 4 MG/2ML IJ SOLN: 4.0000 mg | Freq: Three times a day (TID) | INTRAMUSCULAR | Status: DC | PRN

## 2023-11-17 MED ORDER — METRONIDAZOLE 500 MG/100ML IV SOLN
500.0000 mg | Freq: Once | INTRAVENOUS | Status: AC
  Administered 2023-11-17: 500 mg via INTRAVENOUS
  Filled 2023-11-17: qty 100

## 2023-11-17 MED ORDER — ORAL CARE MOUTH RINSE
15.0000 mL | OROMUCOSAL | Status: AC | PRN
Start: 2023-11-17 — End: ?

## 2023-11-17 MED ORDER — ONDANSETRON HCL 4 MG/2ML IJ SOLN
4.0000 mg | Freq: Once | INTRAMUSCULAR | Status: AC
  Administered 2023-11-17: 4 mg via INTRAVENOUS
  Filled 2023-11-17: qty 2

## 2023-11-17 MED ORDER — LACTATED RINGERS IV SOLN: INTRAVENOUS | Status: AC

## 2023-11-17 MED ORDER — THIAMINE HCL 100 MG/ML IJ SOLN: 100.0000 mg | Freq: Every day | INTRAMUSCULAR | Status: DC

## 2023-11-17 MED ORDER — METRONIDAZOLE 500 MG/100ML IV SOLN: 500.0000 mg | Freq: Two times a day (BID) | INTRAVENOUS | Status: DC

## 2023-11-17 MED ORDER — FENTANYL CITRATE PF 50 MCG/ML IJ SOSY
50.0000 ug | PREFILLED_SYRINGE | Freq: Once | INTRAMUSCULAR | Status: AC
  Administered 2023-11-17: 50 ug via INTRAVENOUS
  Filled 2023-11-17: qty 1

## 2023-11-17 MED ORDER — ACETAMINOPHEN 10 MG/ML IV SOLN
1000.0000 mg | Freq: Four times a day (QID) | INTRAVENOUS | Status: AC
  Administered 2023-11-17 – 2023-11-18 (×4): 1000 mg via INTRAVENOUS
  Filled 2023-11-17 (×4): qty 100

## 2023-11-17 MED ORDER — THIAMINE MONONITRATE 100 MG PO TABS
100.0000 mg | ORAL_TABLET | Freq: Every day | ORAL | Status: DC
  Administered 2023-11-18 – 2023-11-21 (×4): 100 mg via ORAL
  Filled 2023-11-17 (×4): qty 1

## 2023-11-17 MED ORDER — ADULT MULTIVITAMIN W/MINERALS CH
1.0000 | ORAL_TABLET | Freq: Every day | ORAL | Status: DC
  Administered 2023-11-18 – 2023-11-21 (×4): 1 via ORAL
  Filled 2023-11-17 (×4): qty 1

## 2023-11-17 MED ORDER — LACTATED RINGERS IV BOLUS: 1000.0000 mL | Freq: Once | INTRAVENOUS | Status: AC

## 2023-11-17 NOTE — Assessment & Plan Note (Signed)
 Febrile to 100.8 overnight.  Pain suboptimally controlled will add on p.o oxycodone  in addition to IV Dilaudid .  Started on bowel regimen.  Will continue fluids given still decreased p.o. intake. - Contact urology as needed - Continue fluids - CTX for 7-day course (6/14 - 6/20), status post flagyl (6/14 only) for presumed intra-abdominal infection until urology was consulted - Pain regimen: IV Tylenol  1000 mg q6h, Dilaudid  1 mg q4h prn, Oxycodone  5mg  q4h prn - Miralax and Senna - Zofran  IV 4 mg every 8 hours as needed for nausea/vomiting - Monitor fever curve - Repeat CT abdomen/pelvis on 6/16 if not improving or 6/19 if improving per urology

## 2023-11-17 NOTE — Assessment & Plan Note (Signed)
 Asthma: uses Albuterol  prn Seizures: no recent seizure activity, not on any medications

## 2023-11-17 NOTE — Hospital Course (Addendum)
 Kelly Huang is a 38 y.o.female with a history of asthma, seizures who was admitted to the [facility] Medicine Teaching Service at [facility] for acute pyelonephritis. Her hospital course is detailed below:  Acute Pyelonephritis Patient presented with abdominal pain and fever after about 2 weeks of urinary symptoms.  She was seen in the ED twice prior but was unfortunately unable to be reached by team with positive urine culture and recommendation for antibiotic treatment.  She was found to have a leukocytosis on third presentation with CT findings of acute pyelonephritis and possible small developing renal abscess.  She was admitted on Rocephin , fluids, Zofran  for nausea, and opiates for pain control.  Substance use Patient reported use of alcohol, tobacco, and cocaine, with most recent cocaine use 2 days prior.  Given alcohol use 1-2 times per week, she was felt to be lower risk for alcohol withdrawal in the hospital.  UDS was positive for cocaine.  TOC was consulted for substance use resources.   Other chronic conditions were medically managed with home medications and formulary alternatives as necessary (***)  PCP Follow-up Recommendations:

## 2023-11-17 NOTE — Plan of Care (Signed)
  Problem: Education: Goal: Knowledge of General Education information will improve Description: Including pain rating scale, medication(s)/side effects and non-pharmacologic comfort measures Outcome: Progressing   Problem: Health Behavior/Discharge Planning: Goal: Ability to manage health-related needs will improve Outcome: Progressing   Problem: Clinical Measurements: Goal: Ability to maintain clinical measurements within normal limits will improve Outcome: Progressing Goal: Will remain free from infection Outcome: Progressing Goal: Diagnostic test results will improve Outcome: Progressing Goal: Respiratory complications will improve Outcome: Progressing Goal: Cardiovascular complication will be avoided Outcome: Progressing   Problem: Activity: Goal: Risk for activity intolerance will decrease Outcome: Progressing   Problem: Nutrition: Goal: Adequate nutrition will be maintained Outcome: Progressing   Problem: Coping: Goal: Level of anxiety will decrease Outcome: Progressing   Problem: Elimination: Goal: Will not experience complications related to bowel motility Outcome: Progressing Goal: Will not experience complications related to urinary retention Outcome: Progressing   Problem: Pain Managment: Goal: General experience of comfort will improve and/or be controlled Outcome: Not Progressing   Problem: Safety: Goal: Ability to remain free from injury will improve Outcome: Progressing   Problem: Skin Integrity: Goal: Risk for impaired skin integrity will decrease Outcome: Progressing   Pain controlled c prn meds and pt able to sleep. Pt reports living a motel and plans on returning there. Denies assistance with living situation

## 2023-11-17 NOTE — Assessment & Plan Note (Signed)
 UDS positive for cocaine.  Will add on CIWA protocol given reported alcohol use. -CIWAs -HIV and RPR

## 2023-11-17 NOTE — H&P (Addendum)
 Hospital Admission History and Physical Service Pager: [phone]  Patient name: Kelly Huang Medical record number: [id number] Date of Birth: Feb 03, 1986 Age: 38 y.o. Gender: female  Primary Care Provider: Patient, No Pcp Per Consultants: Urology in the ED Code Status: Full Code Preferred Emergency Contact:  Contact Information     Name Relation Home Work Mobile   [place] Sister   [phone]   Chief Complaint: Abdominal pain  Assessment and Plan: Kelly Huang is a 38 y.o. female with past medical history of asthma and cocaine use presenting with abdominal pain. Differential for this patient's presentation of this includes: Acute Pyelonephritis: Most likely in the setting of CT findings and pain.  Will continue antibiotics and pain regimen. Acute Pancreatitis: unlikely secondary to location of pain, UA and CT findings, but also normal lipase. Assessment & Plan Acute pyelonephritis UA positive for UTI.  CT abdomen pelvis confirmed bilateral pyelonephritis, worse in left kidney and .  Symptoms likely related to acute pyelonephritis.  Has not been previously treated with antibiotics even though previously seen in the ER for similar concerns about a week ago.  Urology consulted in the ED and recommended observation for renal abscess with repeat imaging in 2 days (if worsening) or 5 days (if improving). - Admit to FMTS, attending after Dawn Eth - Med-Surg, Vital signs per floor - Contact urology as needed - Regular diet, will re-evaluate the need for fluids prn - Continue antibiotics: CTX for 7-day course (6/14 - 6/20), status post flagyl (6/14 only) for presumed intra-abdominal infection until urology was consulted - Pain regimen: IV Tylenol  1000 mg q6h and Dilaudid  1 mg q4h prn - Zofran  IV 4 mg every 8 hours as needed for nausea/vomiting - AM CBC/BMP - Monitor fever curve - Repeat CT abdomen/pelvis in 2 days if not improving or 5 days if improving per  urology Substance use Patient endorsed use of alcohol, tobacco, cocaine.  She declined nicotine patch.  Last dose of cocaine 2 days ago.  Relatively lower risk for alcohol withdrawal given lower use. - TOC consult - UDS pending Chronic health problem Asthma: uses Albuterol  prn Seizures: no recent seizure activity, not on any medications  FEN/GI: Regular diet VTE Prophylaxis: Lovenox   Disposition: Med-Surg  History of Present Illness:  Kelly Huang is a 38 y.o. female presenting with abdominal pain, worse in labor.  She reports it hurts to cough.  Currently on her menstrual cycle.  Denies urinary symptoms.  Endorses fever, nausea and vomiting.  Pain has been ongoing for the past couple days but worsened last night.  She was in the ED on 5/31 for back pain and urinary frequency. CTAP at that time without hydronephrosis. She was discharged home.  Patient was seen in the ED on 6/8 with urinary urgency and frequency plus flank pain.  UA demonstrated leukocytes at that time.  Patient left prior to being seen by the provider but with positive urinary symptoms and culture was sent a prescription for cefadroxil for 5 days.  The ED was unable to contact the patient, so prescription was never filled.  This morning, her pain became much worse. Last night, she had nausea and vomiting. Also endorses diarrhea. The worsening pain, which made her not able to sleep on her side, brought her to the ED. She denies chest pain or SOB.  In the ED, patient was febrile to 101.6, otherwise VSS.  BNP stable.  CBC with leukocytosis (12.9), hemoglobin 10.8 (lower than her baseline of 12-13), and MCV  of 76.1.  UA with leukocytes, nitrites, proteinuria, and moderate hemoglobinuria.  Urine pregnancy test negative.  CT abdomen pelvis demonstrated acute pyelonephritis, likely bilateral but worse in the left kidney.  Underlying small pre-existing renal cyst versus small developing renal abscess noted.  Mild lung base  atelectasis also noted.  CXR revealed healing right lateral sixth rib fracture, newly identified since January.  Patient was treated with LR bolus, ceftriaxone  2g, metronidazole 500 mg, Zofran , fentanyl , and Tylenol  suppository.  EDP discussed case with Dr. Cherylene Corrente with urology, who recommended repeat imaging if not improving in 48 hours or 5-day follow-up imaging if improving.  Urology available to consult as needed.  Review Of Systems: Per HPI  Pertinent Past Medical History: Asthma Cocaine use Seizures, none recently and no medications Remainder reviewed in history tab.   Pertinent Past Surgical History: None Remainder reviewed in history tab.   Pertinent Social History: Tobacco use: 4-5 cigarettes a day for 8 years, declines nicotine patch Alcohol use: 1-2 drinks per week Other Substance use: cocaine use 1-2 times per week, last used 2 days ago Lives with a friend in a hotel nearby when she is in [place], but her permanent address is in [place].  Pertinent Family History: Sister: lung cancer, deceased No heart history. Remainder reviewed in history tab.   Important Outpatient Medications: Albuterol  2 puffs every 4 hours as needed\ Remainder reviewed in medication history.   Objective: BP 101/71 (BP Location: Left Arm)   Pulse 89   Temp 98.2 F (36.8 C) (Oral)   Resp 18   Ht 5' (1.524 m)   Wt 47.4 kg   LMP 10/18/2023   SpO2 100%   BMI 20.39 kg/m  Exam: General: Awake and Alert in NAD HEENT: NCAT. Sclera anicteric. No rhinorrhea. Cardiovascular: RRR. No M/R/G Respiratory: CTAB, normal WOB on RA. No wheezing, crackles, rhonchi, or diminished breath sounds. Abdomen: Soft, mild tenderness over b/l flanks, non-distended. Bowel sounds normoactive Extremities: Able to move all extremities. No BLE edema, no deformities or significant joint findings. Skin: Warm and dry. No abrasions or rashes noted. Neuro: A&Ox3. No focal neurological deficits.  Labs:  CBC BMET   Recent Labs  Lab 11/17/23 0847  WBC 12.9*  HGB 10.8*  HCT 36.2  PLT 503*   Recent Labs  Lab 11/17/23 0847  NA 134*  K 4.0  CL 99  CO2 22  BUN 6  CREATININE 0.94  GLUCOSE 137*  CALCIUM  8.7*     Lipase: 24  UA: Positive nitrite, large leukocytes, moderate hemoglobinuria, few bacteria  EKG: NSR.  Regular rate at 85.  No ST elevation.  Likely some artifact secondary to movement.  Imaging Studies Performed:  CXR: No acute cardiopulmonary abnormality.  Healing right lateral sixth rib fracture newly identified since January.  CT abdomen pelvis 1. Acute Pyelonephritis, likely bilateral but much more conspicuous in the left kidney. Urothelial thickening and enhancement, pyonephrosis not excluded. And underlying small pre-existing renal cysts versus small Developing Renal Abscess (right upper pole 10 mm coronal image 30). No perinephric fluid.  No obstructive uropathy at this time.  2. Paucity of visceral fat. No other acute or inflammatory process identified in the abdomen or pelvis.  3. Mild lung base atelectasis.   Clyda Dark, DO 11/17/2023, 2:30 PM PGY-1, [facility]  FPTS Intern pager: [phone], text pages welcome Secure chat group [facility] Teaching Service   I agree with the assessment and plan as documented above.  Genetta Kenning, MD PGY-2,  [facility]

## 2023-11-17 NOTE — Progress Notes (Signed)
 NEW ADMISSION NOTE New Admission Note:   Arrival Method: walked to bed with steady gait Mental Orientation: alert and oriented x 4 Telemetry: not ordered Assessment: Completed Skin: intact IV: NSL L UE Pain: see flowsheet Tubes: none Safety Measures: Safety Fall Prevention Plan has been given, discussed and signed Admission: Completed [facility] Orientation: Patient has been orientated to the room, unit and staff.  Family: not present  Orders have been reviewed and implemented. Will continue to monitor the patient. Call light has been placed within reach and bed alarm has been activated.   Neidra Girvan A Proctor-Gann, RN

## 2023-11-17 NOTE — Assessment & Plan Note (Addendum)
 UA positive for UTI.  CT abdomen pelvis confirmed bilateral pyelonephritis, worse in left kidney and .  Symptoms likely related to acute pyelonephritis.  Has not been previously treated with antibiotics even though previously seen in the ER for similar concerns about a week ago.  Urology consulted in the ED and recommended observation for renal abscess with repeat imaging in 2 days (if worsening) or 5 days (if improving). - Admit to FMTS, attending after Dawn Eth - Med-Surg, Vital signs per floor - Contact urology as needed - Regular diet, will re-evaluate the need for fluids prn - Continue antibiotics: CTX for 7-day course (6/14 - 6/20), status post flagyl (6/14 only) for presumed intra-abdominal infection until urology was consulted - Pain regimen: IV Tylenol  1000 mg q6h and Dilaudid  1 mg q4h prn - Zofran  IV 4 mg every 8 hours as needed for nausea/vomiting - AM CBC/BMP - Monitor fever curve - Repeat CT abdomen/pelvis in 2 days if not improving or 5 days if improving per urology

## 2023-11-17 NOTE — Assessment & Plan Note (Addendum)
 Patient endorsed use of alcohol, tobacco, cocaine.  She declined nicotine patch.  Last dose of cocaine 2 days ago.  Relatively lower risk for alcohol withdrawal given lower use. - TOC consult - UDS pending

## 2023-11-17 NOTE — Progress Notes (Incomplete)
 Daily Progress Note Intern Pager: [phone]  Patient name: Kelly Huang Medical record number: [id number] Date of birth: 01-Apr-1986 Age: 38 y.o. Gender: female  Primary Care Provider: Patient, No Pcp Per Consultants: Urology Code Status: Full code  Pt Overview and Major Events to Date:  6/14: Admitted to FMTS  Assessment and Plan: Kelly Huang is a 38 y.o. female with PMHx asthma and cocaine use admitted for abdominal pain secondary to acute pyelonephritis. Assessment & Plan Acute pyelonephritis Febrile to 100.8 overnight.  Pain suboptimally controlled will add on p.o oxycodone  in addition to IV Dilaudid .  Started on bowel regimen.  Will continue fluids given still decreased p.o. intake. - Contact urology as needed - Continue fluids - CTX for 7-day course (6/14 - 6/20), status post flagyl (6/14 only) for presumed intra-abdominal infection until urology was consulted - Pain regimen: IV Tylenol  1000 mg q6h, Dilaudid  1 mg q4h prn, Oxycodone  5mg  q4h prn - Miralax and Senna - Zofran  IV 4 mg every 8 hours as needed for nausea/vomiting - Monitor fever curve - Repeat CT abdomen/pelvis on 6/16 if not improving or 6/19 if improving per urology Substance use UDS positive for cocaine.  Will add on CIWA protocol given inconsistently reported alcohol use.  Prior documentation from [facility] stating possible HIV, will repeat testing. -CIWAs -HIV and RPR Chronic health problem Asthma: uses Albuterol  prn Seizures: no recent seizure activity, not on any medications   FEN/GI: Regular PPx: Lovenox  Dispo: Home pending clinical improvement  Subjective:  Assessed at bedside, reports she intermittently has pretty severe pain still states she has to get in 1 position and stay there because movement is very bothersome.  Still not eating or drinking much due to pain.  Reports IV Dilaudid  is helping.  Reports last time she drank was months ago.  Objective: Temp:  [97.9 F (36.6 C)-101.6 F  (38.7 C)] 100.8 F (38.2 C) (06/14 2026) Pulse Rate:  [77-99] 96 (06/14 2026) Resp:  [13-24] 18 (06/14 2026) BP: (92-129)/(59-82) 105/69 (06/14 2026) SpO2:  [98 %-100 %] 100 % (06/14 2026) Weight:  [47.4 kg] 47.4 kg (06/14 1238) Physical Exam: General: Laying on left side, limited movements due to pain.  Mild distress with movements Cardiovascular: RRR without murmur Respiratory: CTAB.  Normal work of breathing on room air Abdomen: Soft, significant tenderness to palpation diffusely.  Mild distention.  + BS Extremities: No peripheral edema  Laboratory: Most recent CBC Lab Results  Component Value Date   WBC 12.9 (H) 11/17/2023   HGB 10.8 (L) 11/17/2023   HCT 36.2 11/17/2023   MCV 76.1 (L) 11/17/2023   PLT 503 (H) 11/17/2023   Most recent BMP    Latest Ref Rng & Units 11/17/2023    8:47 AM  BMP  Glucose 70 - 99 mg/dL 147   BUN 6 - 20 mg/dL 6   Creatinine 8.29 - 5.62 mg/dL 1.30   Sodium 865 - 784 mmol/L 134   Potassium 3.5 - 5.1 mmol/L 4.0   Chloride 98 - 111 mmol/L 99   CO2 22 - 32 mmol/L 22   Calcium  8.9 - 10.3 mg/dL 8.7     Other pertinent labs: UDS: + Cocaine Pregnancy test: Negative  Imaging/Diagnostic Tests: CT ABDOMEN PELVIS W CONTRAST Result Date: 11/17/2023 IMPRESSION: 1. Acute Pyelonephritis, likely bilateral but much more conspicuous in the left kidney. Urothelial thickening and enhancement, pyonephrosis not excluded. And underlying small pre-existing renal cysts versus small Developing Renal Abscess (right upper pole 10 mm coronal image 30).  No perinephric fluid.  No obstructive uropathy at this time. 2. Paucity of visceral fat. No other acute or inflammatory process identified in the abdomen or pelvis. 3. Mild lung base atelectasis.  DG Chest Portable 1 View Result Date: 11/17/2023 IMPRESSION: 1. No acute cardiopulmonary abnormality. 2. Healing right lateral 6th rib fracture newly identified since January.      Jonne Netters, MD 11/17/2023, 11:31  PM  PGY-2, [place] [facility] FPTS Intern pager: [phone], text pages welcome Secure chat group [facility] Teaching Service

## 2023-11-17 NOTE — ED Provider Notes (Signed)
 [place] EMERGENCY DEPARTMENT AT [facility] Provider Note   CSN: [id number] Arrival date & time: 11/17/23  0865     Patient presents with: Abdominal Pain   Kelly Huang is a 38 y.o. female.   HPI     Lower abdomen, worse than labor pains, hurts to cough, just whole lower abdomen On menses, no vaginal discharge No urinary symptoms Nausea and vomiting 2 times 2AM pain started Was constipation then diarrhea for last 2 days. No black or bloody stools Fever at home for a few days Abdominal pain on and off for a few days but worse last night Can't lay on either side just back Slight cough, no congestion  Dyspnea/cp   Past Medical History:  Diagnosis Date   Alcohol use complicating pregnancy 02/18/2022   Asthma    Asthma affecting pregnancy in second trimester 03/01/2022   Cocaine use complicating pregnancy 02/18/2022   Herniated lumbar intervertebral disc    Seizures (HCC)      Prior to Admission medications   Medication Sig Start Date End Date Taking? Authorizing Provider  acetaminophen  (TYLENOL ) 500 MG tablet Take 1,000 mg by mouth 2 (two) times daily as needed for moderate pain (pain score 4-6), fever or headache.   Yes [provider]    Allergies: Bactrim [sulfamethoxazole-trimethoprim]    Review of Systems  Updated Vital Signs BP 105/69 (BP Location: Right Arm)   Pulse 96   Temp (!) 100.8 F (38.2 C) (Oral)   Resp 18   Ht 5' (1.524 m)   Wt 47.4 kg   LMP 10/18/2023   SpO2 100%   BMI 20.39 kg/m   Physical Exam  (all labs ordered are listed, but only abnormal results are displayed) Labs Reviewed  COMPREHENSIVE METABOLIC PANEL WITH GFR - Abnormal; Notable for the following components:      Result Value   Sodium 134 (*)    Glucose, Bld 137 (*)    Calcium  8.7 (*)    Albumin 3.2 (*)    All other components within normal limits  CBC - Abnormal; Notable for the following components:   WBC 12.9 (*)    Hemoglobin 10.8 (*)     MCV 76.1 (*)    MCH 22.7 (*)    MCHC 29.8 (*)    RDW 16.7 (*)    Platelets 503 (*)    All other components within normal limits  URINALYSIS, ROUTINE W REFLEX MICROSCOPIC - Abnormal; Notable for the following components:   APPearance CLOUDY (*)    Hgb urine dipstick MODERATE (*)    Protein, ur 30 (*)    Nitrite POSITIVE (*)    Leukocytes,Ua LARGE (*)    Bacteria, UA FEW (*)    All other components within normal limits  RAPID URINE DRUG SCREEN, HOSP PERFORMED - Abnormal; Notable for the following components:   Cocaine POSITIVE (*)    All other components within normal limits  CULTURE, BLOOD (ROUTINE X 2)  CULTURE, BLOOD (ROUTINE X 2)  LIPASE, BLOOD  HCG, SERUM, QUALITATIVE  CBC  BASIC METABOLIC PANEL WITH GFR    EKG: EKG Interpretation Date/Time:  Saturday November 17 2023 08:48:57 EDT Ventricular Rate:  85 PR Interval:  140 QRS Duration:  101 QT Interval:  349 QTC Calculation: 415 R Axis:   100  Text Interpretation: Sinus rhythm Atrial premature complex Probable left atrial enlargement Borderline right axis deviation No significant change since last tracing Confirmed by Scarlette Currier ([id number]) on 11/17/2023 11:22:13 PM  Radiology:  CT ABDOMEN PELVIS W CONTRAST Result Date: 11/17/2023 CLINICAL DATA:  38 year old female with abdominal pain since 0200 hours. Nausea vomiting and diarrhea. Painful coughing. EXAM: CT ABDOMEN AND PELVIS WITH CONTRAST TECHNIQUE: Multidetector CT imaging of the abdomen and pelvis was performed using the standard protocol following bolus administration of intravenous contrast. RADIATION DOSE REDUCTION: This exam was performed according to the departmental dose-optimization program which includes automated exposure control, adjustment of the mA and/or kV according to patient size and/or use of iterative reconstruction technique. CONTRAST:  75mL OMNIPAQUE  IOHEXOL  350 MG/ML SOLN COMPARISON:  Portable chest x-ray today reported separately. Recent noncontrast CT  Abdomen and Pelvis 11/03/2023. FINDINGS: Lower chest: Mild dependent left lung base atelectasis. Mild dependent atelectasis greater on the left. No pleural or pericardial effusion. Hepatobiliary: Liver and gallbladder are within normal limits. No bile duct dilatation. Pancreas: Within normal limits. Spleen: Within normal limits. Adrenals/Urinary Tract: Adrenal glands appear diminutive and normal. Multifocal heterogeneous, abnormal renal cortex hypoenhancement more apparent on the left (coronal images 30 and 33), but right upper pole renal enhancement is also heterogeneous, and in both kidneys there are superimposed small areas of simple fluid density (right kidney same image). Evidence of bilateral urothelial mild thickening and enhancement more pronounced at the asymmetric right renal pelvis and ureteropelvic junction on coronal image 39. No superimposed nephrolithiasis. No discrete Peri renal fluid. Both ureters quickly taper distal to the UPJ. Punctate bilateral pelvic calcifications appear stable and are most likely phleboliths. Urinary bladder is diminutive, not significantly thickened. Stomach/Bowel: Mildly gas distended transverse colon, mild retained gas and stool in the large bowel elsewhere. No abnormally dilated loops. Diminutive stomach and duodenum. No pneumoperitoneum. Paucity of visceral fat.  No convincing mesenteric inflammation. Vascular/Lymphatic: Major vascular structures in the abdomen and pelvis are enhancing, patent, normal. No lymphadenopathy identified. Reproductive: Uterine endometrium and myometrium is heterogeneous and indistinct following contrast, but of unclear significance. Ovaries/adnexa within normal limits. Other: No pelvis free fluid identified. Musculoskeletal: Lateral 6th rib is not been included on these recent CTs, see portable chest today separately. No acute osseous abnormality identified. IMPRESSION: 1. Acute Pyelonephritis, likely bilateral but much more conspicuous in the  left kidney. Urothelial thickening and enhancement, pyonephrosis not excluded. And underlying small pre-existing renal cysts versus small Developing Renal Abscess (right upper pole 10 mm coronal image 30). No perinephric fluid.  No obstructive uropathy at this time. 2. Paucity of visceral fat. No other acute or inflammatory process identified in the abdomen or pelvis. 3. Mild lung base atelectasis. Electronically Signed   By: Marlise Simpers M.D.   On: 11/17/2023 10:43   DG Chest Portable 1 View Result Date: 11/17/2023 CLINICAL DATA:  38 year old female with abdominal pain since 0200 hours. Nausea vomiting and diarrhea. Painful coughing. EXAM: PORTABLE CHEST 1 VIEW COMPARISON:  Chest radiographs 07/01/2023. CT Abdomen and Pelvis today reported separately. FINDINGS: Portable AP supine view at 1003 hours. Mild scoliosis and sternal pectus. Lung volumes and mediastinal contours are stable and within normal limits. Incidental nipple shadows on both sides. Allowing for portable technique the lungs are clear. Visualized tracheal air column is within normal limits. No pneumothorax or pneumoperitoneum. Visible bowel gas pattern relatively stable, see CT Abdomen and Pelvis reported separately. A healing right lateral 6th rib fracture is new since January. No acute osseous abnormality identified. IMPRESSION: 1. No acute cardiopulmonary abnormality. 2. Healing right lateral 6th rib fracture newly identified since January. Electronically Signed   By: Marlise Simpers M.D.   On: 11/17/2023 10:35  Procedures   Medications Ordered in the ED  enoxaparin  (LOVENOX ) injection 40 mg (40 mg Subcutaneous Given 11/17/23 1348)  acetaminophen  (OFIRMEV ) IV 1,000 mg (1,000 mg Intravenous New Bag/Given 11/17/23 2156)  HYDROmorphone  (DILAUDID ) injection 1 mg (1 mg Intravenous Given 11/17/23 1817)  cefTRIAXone  (ROCEPHIN ) 2 g in sodium chloride  0.9 % 100 mL IVPB (has no administration in time range)  ondansetron  (ZOFRAN ) injection 4 mg (has no  administration in time range)  lactated ringers  infusion ( Intravenous Infusion Verify 11/17/23 1822)  Oral care mouth rinse (has no administration in time range)  lactated ringers  bolus 1,000 mL (0 mLs Intravenous Stopped 11/17/23 1107)  acetaminophen  (TYLENOL ) suppository 650 mg (650 mg Rectal Given 11/17/23 0935)  fentaNYL  (SUBLIMAZE ) injection 50 mcg (50 mcg Intravenous Given 11/17/23 0930)  ondansetron  (ZOFRAN ) injection 4 mg (4 mg Intravenous Given 11/17/23 0929)  cefTRIAXone  (ROCEPHIN ) 1 g in sodium chloride  0.9 % 100 mL IVPB (0 g Intravenous Stopped 11/17/23 1014)  metroNIDAZOLE (FLAGYL) IVPB 500 mg (0 mg Intravenous Stopped 11/17/23 1107)  iohexol  (OMNIPAQUE ) 350 MG/ML injection 75 mL (75 mLs Intravenous Contrast Given 11/17/23 1013)  cefTRIAXone  (ROCEPHIN ) 1 g in sodium chloride  0.9 % 100 mL IVPB (0 g Intravenous Stopped 11/17/23 1138)                                     38 year old female with history of asthma, seizures, presents with concern for fever and abdominal pain.  Differential diagnosis includes appendicitis, diverticulitis, perforated peptic ulcer, cholecystitis, PID, pyelonephritis, nephrolithiasis, viral etiology, pneumonia.  Labs obtained and personally eval and interpreted by me show no sign of pancreatitis, no clinically significant electrolyte abnormalities, leukocytosis, stable anemia, urinalysis consistent with urinary tract infection, negative pregnancy test.  Blood cultures were ordered and pending.  Given abdominal pain, tenderness, and fever, ordered Rocephin  and Flagyl initially with concern for intra-abdominal infection as well as a CT abdomen pelvis.  Chest x-ray completed to evaluate for signs of pneumonia shows no acute cardiopulmonary abnormalities, shows healing rib fracture.   CT abdomen pelvis was completed and personally bite interpreted by me showed pyelonephritis, with possible underlying renal abscess  Discussed with Dr. Cherylene Corrente Urology.  Recommends  IV abx, repeat imaging 48 hours if not improving, 5 day follow up CT if improving.  They can be available to consult as needed.    Will admit for continued care.     Final diagnoses:  Pyelonephritis    ED Discharge Orders     None          Scarlette Currier, MD 11/17/23 [phone]

## 2023-11-17 NOTE — Assessment & Plan Note (Addendum)
 Asthma: uses Albuterol  prn Seizures: no recent seizure activity, not on any medications

## 2023-11-17 NOTE — ED Triage Notes (Signed)
 Pt c/o abdominal pain that started about 0200 today. Pt states she is having n/v/d. States it hurts to cough.

## 2023-11-18 DIAGNOSIS — N10 Acute tubulo-interstitial nephritis: Secondary | ICD-10-CM | POA: Diagnosis not present

## 2023-11-18 LAB — BASIC METABOLIC PANEL WITH GFR
Anion gap: 8 (ref 5–15)
BUN: 9 mg/dL (ref 6–20)
CO2: 26 mmol/L (ref 22–32)
Calcium: 8.2 mg/dL — ABNORMAL LOW (ref 8.9–10.3)
Chloride: 104 mmol/L (ref 98–111)
Creatinine, Ser: 0.74 mg/dL (ref 0.44–1.00)
GFR, Estimated: >60 mL/min (ref >60)
Glucose, Bld: 108 mg/dL — ABNORMAL HIGH (ref 70–99)
Potassium: 3.9 mmol/L (ref 3.5–5.1)
Sodium: 138 mmol/L (ref 135–145)

## 2023-11-18 LAB — CBC
HCT: 31.6 % — ABNORMAL LOW (ref 36.0–46.0)
Hemoglobin: 9.3 g/dL — ABNORMAL LOW (ref 12.0–15.0)
MCH: 22.2 pg — ABNORMAL LOW (ref 26.0–34.0)
MCHC: 29.4 g/dL — ABNORMAL LOW (ref 30.0–36.0)
MCV: 75.4 fL — ABNORMAL LOW (ref 80.0–100.0)
Platelets: 500 10*3/uL — ABNORMAL HIGH (ref 150–400)
RBC: 4.19 MIL/uL (ref 3.87–5.11)
RDW: 16.6 % — ABNORMAL HIGH (ref 11.5–15.5)
WBC: 9.5 10*3/uL (ref 4.0–10.5)
nRBC: 0 % (ref 0.0–0.2)

## 2023-11-18 LAB — RPR
RPR Ser Ql: REACTIVE — AB
RPR Titer: 1:1 {titer}

## 2023-11-18 LAB — HIV ANTIBODY (ROUTINE TESTING W REFLEX): HIV Screen 4th Generation wRfx: NONREACTIVE

## 2023-11-18 MED ORDER — POLYETHYLENE GLYCOL 3350 17 G PO PACK
17.0000 g | PACK | Freq: Every day | ORAL | Status: DC
  Administered 2023-11-20 – 2023-11-21 (×2): 17 g via ORAL
  Filled 2023-11-18 (×4): qty 1

## 2023-11-18 MED ORDER — OXYCODONE HCL 5 MG PO TABS
5.0000 mg | ORAL_TABLET | ORAL | Status: DC | PRN
  Administered 2023-11-18 – 2023-11-21 (×13): 5 mg via ORAL
  Filled 2023-11-18 (×14): qty 1

## 2023-11-18 MED ORDER — SENNA 8.6 MG PO TABS
1.0000 | ORAL_TABLET | Freq: Every day | ORAL | Status: DC
  Administered 2023-11-20 – 2023-11-21 (×2): 8.6 mg via ORAL
  Filled 2023-11-18 (×4): qty 1

## 2023-11-18 MED ORDER — CARMEX CLASSIC LIP BALM EX OINT
1.0000 | TOPICAL_OINTMENT | CUTANEOUS | Status: DC | PRN
  Administered 2023-11-19: 1 via TOPICAL
  Filled 2023-11-18 (×2): qty 10

## 2023-11-18 NOTE — Plan of Care (Signed)
  Problem: Education: Goal: Knowledge of General Education information will improve Description: Including pain rating scale, medication(s)/side effects and non-pharmacologic comfort measures Outcome: Progressing   Problem: Health Behavior/Discharge Planning: Goal: Ability to manage health-related needs will improve Outcome: Progressing   Problem: Clinical Measurements: Goal: Ability to maintain clinical measurements within normal limits will improve Outcome: Progressing Goal: Will remain free from infection Outcome: Progressing Goal: Diagnostic test results will improve Outcome: Progressing Goal: Respiratory complications will improve Outcome: Progressing Goal: Cardiovascular complication will be avoided Outcome: Progressing   Problem: Activity: Goal: Risk for activity intolerance will decrease Outcome: Not Progressing   Problem: Pain Managment: Goal: General experience of comfort will improve and/or be controlled Outcome: Not Progressing

## 2023-11-19 ENCOUNTER — Inpatient Hospital Stay (HOSPITAL_COMMUNITY)

## 2023-11-19 DIAGNOSIS — N10 Acute tubulo-interstitial nephritis: Secondary | ICD-10-CM | POA: Diagnosis not present

## 2023-11-19 LAB — BASIC METABOLIC PANEL WITH GFR
Anion gap: 9 (ref 5–15)
BUN: 7 mg/dL (ref 6–20)
CO2: 29 mmol/L (ref 22–32)
Calcium: 8.3 mg/dL — ABNORMAL LOW (ref 8.9–10.3)
Chloride: 101 mmol/L (ref 98–111)
Creatinine, Ser: 0.75 mg/dL (ref 0.44–1.00)
GFR, Estimated: >60 mL/min (ref >60)
Glucose, Bld: 94 mg/dL (ref 70–99)
Potassium: 4.1 mmol/L (ref 3.5–5.1)
Sodium: 139 mmol/L (ref 135–145)

## 2023-11-19 LAB — CBC
HCT: 30.1 % — ABNORMAL LOW (ref 36.0–46.0)
Hemoglobin: 8.9 g/dL — ABNORMAL LOW (ref 12.0–15.0)
MCH: 22.8 pg — ABNORMAL LOW (ref 26.0–34.0)
MCHC: 29.6 g/dL — ABNORMAL LOW (ref 30.0–36.0)
MCV: 77.2 fL — ABNORMAL LOW (ref 80.0–100.0)
Platelets: 546 10*3/uL — ABNORMAL HIGH (ref 150–400)
RBC: 3.9 MIL/uL (ref 3.87–5.11)
RDW: 16.9 % — ABNORMAL HIGH (ref 11.5–15.5)
WBC: 7.7 10*3/uL (ref 4.0–10.5)
nRBC: 0 % (ref 0.0–0.2)

## 2023-11-19 LAB — T.PALLIDUM AB, TOTAL: T Pallidum Abs: REACTIVE — AB

## 2023-11-19 MED ORDER — ACETAMINOPHEN 500 MG PO TABS: 1000.0000 mg | ORAL_TABLET | Freq: Four times a day (QID) | ORAL | Status: DC | PRN

## 2023-11-19 MED ORDER — ACETAMINOPHEN 500 MG PO TABS: 1000.0000 mg | ORAL_TABLET | Freq: Four times a day (QID) | ORAL | Status: DC

## 2023-11-19 MED ORDER — IOHEXOL 350 MG/ML SOLN
75.0000 mL | Freq: Once | INTRAVENOUS | Status: AC | PRN
Start: 2023-11-19 — End: 2023-11-19
  Administered 2023-11-19: 75 mL via INTRAVENOUS

## 2023-11-19 NOTE — Assessment & Plan Note (Addendum)
 Febrile to 100.6 overnight, defervesced appropriately.  Will obtain CTAP today to assess for loculations or abscesses. - Contact urology as needed - Continue fluids -Follow-up CTAP - CTX for 7-day course (6/14 - 6/20), status post flagyl (6/14 only)  - Pain regimen: IV Tylenol  1000 mg q6h, Dilaudid  1 mg q4h prn, Oxycodone  5mg  q4h prn - Miralax and Senna - Zofran  IV 4 mg every 8 hours as needed for nausea/vomiting - Monitor fever curve -Incentive spirometry to encourage respiration

## 2023-11-19 NOTE — TOC CM/SW Note (Signed)
 Transition of Care [facility]) - Inpatient Brief Assessment   Patient Details  Name: Obelia Bonello MRN: [id number] Date of Birth: 1986-04-20  Transition of Care [facility]) CM/SW Contact:    Tom-Johnson, Angelique Ken, RN Phone Number: 11/19/2023, 3:23 PM   Clinical Narrative:  Patient presented to the ED with Abdominal pains, N/V/Diarrhea. CT Abdomen/Pelvis showed bilat Pyelonephritis, worse in Lt Kidney. UA positive for UTI, on IV abx. Urology consulted, recommended observation for Renal Abscess with repeat imaging in 2 days (if worsening) or 5 days (if improving). Patient has hx of Polysubstance abuse. Decline SA materials.   CM spoke with patient at bedside about needs for discharge disposition. Patient states she lives at a [place] with her boyfriend and will return at discharge. Does not have children, not employed and not on disability. Does not have DME's. Has sisters that are supportive at times. Patient does not have a PCP,  hospital f/u will be scheduled at discharge. Uses [facility] on [address]  Patient not Medically ready for discharge.  CM will continue to follow as patient progresses with care towards discharge.     Transition of Care Asessment: Insurance and Status: Insurance coverage has been reviewed Patient has primary care physician: No (Hosp f/u will be scheduled at discharge.) Home environment has been reviewed: Yes Prior level of function:: Independent, lives at a Motel. Prior/Current Home Services: No current home services Social Drivers of Health Review: SDOH reviewed no interventions necessary Readmission risk has been reviewed: Yes Transition of care needs: transition of care needs identified, TOC will continue to follow

## 2023-11-19 NOTE — Assessment & Plan Note (Signed)
 Asthma: uses Albuterol  prn Seizures: no recent seizure activity, not on any medications

## 2023-11-19 NOTE — Plan of Care (Signed)
   Problem: Education: Goal: Knowledge of General Education information will improve Description Including pain rating scale, medication(s)/side effects and non-pharmacologic comfort measures Outcome: Progressing   Problem: Health Behavior/Discharge Planning: Goal: Ability to manage health-related needs will improve Outcome: Progressing

## 2023-11-19 NOTE — Assessment & Plan Note (Signed)
 HIV nonreactive RPR reactive 1-1.  T.Pall antibodies pending.  No CIWA's documented. -CIWAs -HIV and RPR

## 2023-11-19 NOTE — TOC CAGE-AID Note (Signed)
 Transition of Care [facility]) - CAGE-AID Screening   Patient Details  Name: Kelly Huang MRN: [id number] Date of Birth: [birth year]  [facility]) CM/SW Contact:    Tom-Johnson, Angelique Ken, RN Phone Number: 11/19/2023, 3:36 PM   Clinical Narrative:  Cage Aid done, patient declined educational materials.   CAGE-AID Screening:    Have You Ever Felt You Ought to Cut Down on Your Drinking or Drug Use?: Yes Have People Annoyed You By Critizing Your Drinking Or Drug Use?: No Have You Felt Bad Or Guilty About Your Drinking Or Drug Use?: No Have You Ever Had a Drink or Used Drugs First Thing In The Morning to Steady Your Nerves or to Get Rid of a Hangover?: No CAGE-AID Score: 1     Substance abuse interventions: Other (must comment) (decline Transport planner)

## 2023-11-19 NOTE — Progress Notes (Signed)
     Daily Progress Note Intern Pager: [phone]  Patient name: Kelly Huang Medical record number: [id number] Date of birth: 22-Sep-1985 Age: 38 y.o. Gender: female  Primary Care Provider: Patient, No Pcp Per Consultants: Urology Code Status: Full code  Pt Overview and Major Events to Date:  6/14: Admitted  Assessment and Plan:  This is a 38 year old female patient with a PMH of asthma and cocaine use admitted for abdominal pain secondary to acute pyelonephritis. Assessment & Plan Acute pyelonephritis Febrile to 100.6 overnight, defervesced appropriately.  Will obtain CTAP today to assess for loculations or abscesses. - Contact urology as needed - Continue fluids -Follow-up CTAP - CTX for 7-day course (6/14 - 6/20), status post flagyl (6/14 only)  - Pain regimen: IV Tylenol  1000 mg q6h, Dilaudid  1 mg q4h prn, Oxycodone  5mg  q4h prn - Miralax and Senna - Zofran  IV 4 mg every 8 hours as needed for nausea/vomiting - Monitor fever curve -Incentive spirometry to encourage respiration Substance use HIV nonreactive RPR reactive 1-1.  T.Pall antibodies pending.  No CIWA's documented. -CIWAs -HIV and RPR Chronic health problem Asthma: uses Albuterol  prn Seizures: no recent seizure activity, not on any medications  FEN/GI: Regular PPx: Lovenox  Dispo:Home pending clinical improvement .    Subjective:  No acute overnight events.  Patient reports her pain is better controlled today.  Objective: Temp:  [98.3 F (36.8 C)-101.6 F (38.7 C)] 98.3 F (36.8 C) (06/16 0743) Pulse Rate:  [71-96] 71 (06/16 0743) Resp:  [16-18] 18 (06/16 0743) BP: (88-108)/(53-72) 106/63 (06/16 0743) SpO2:  [94 %-100 %] 94 % (06/16 0743) Physical Exam: General: Ill-appearing, no distress Cardiovascular: RRR, no M/R/G Respiratory: CTAB, no increased work of breathing Abdomen: Flat, soft.  TTP over right flank Extremities: 2+ pulses x 4.  No evidence of peripheral edema  Laboratory: Most recent  CBC Lab Results  Component Value Date   WBC 7.7 11/19/2023   HGB 8.9 (L) 11/19/2023   HCT 30.1 (L) 11/19/2023   MCV 77.2 (L) 11/19/2023   PLT 546 (H) 11/19/2023   Most recent BMP    Latest Ref Rng & Units 11/19/2023    5:22 AM  BMP  Glucose 70 - 99 mg/dL 94   BUN 6 - 20 mg/dL 7   Creatinine 3.32 - 9.51 mg/dL 8.84   Sodium 166 - 063 mmol/L 139   Potassium 3.5 - 5.1 mmol/L 4.1   Chloride 98 - 111 mmol/L 101   CO2 22 - 32 mmol/L 29   Calcium  8.9 - 10.3 mg/dL 8.3     Kelly Calandra, DO 11/19/2023, 7:46 AM  PGY-1, [facility] FPTS Intern pager: [phone], text pages welcome Secure chat group [facility] Teaching Service

## 2023-11-20 DIAGNOSIS — M542 Cervicalgia: Secondary | ICD-10-CM

## 2023-11-20 DIAGNOSIS — N10 Acute tubulo-interstitial nephritis: Secondary | ICD-10-CM | POA: Diagnosis not present

## 2023-11-20 DIAGNOSIS — D509 Iron deficiency anemia, unspecified: Secondary | ICD-10-CM | POA: Insufficient documentation

## 2023-11-20 LAB — CBC
HCT: 30.8 % — ABNORMAL LOW (ref 36.0–46.0)
Hemoglobin: 8.9 g/dL — ABNORMAL LOW (ref 12.0–15.0)
MCH: 22.4 pg — ABNORMAL LOW (ref 26.0–34.0)
MCHC: 28.9 g/dL — ABNORMAL LOW (ref 30.0–36.0)
MCV: 77.6 fL — ABNORMAL LOW (ref 80.0–100.0)
Platelets: 568 10*3/uL — ABNORMAL HIGH (ref 150–400)
RBC: 3.97 MIL/uL (ref 3.87–5.11)
RDW: 17.2 % — ABNORMAL HIGH (ref 11.5–15.5)
WBC: 7.1 10*3/uL (ref 4.0–10.5)
nRBC: 0 % (ref 0.0–0.2)

## 2023-11-20 LAB — IRON AND TIBC
Iron: 13 ug/dL — ABNORMAL LOW (ref 28–170)
Saturation Ratios: 4 % — ABNORMAL LOW (ref 10.4–31.8)
TIBC: 350 ug/dL (ref 250–450)
UIBC: 337 ug/dL

## 2023-11-20 LAB — FERRITIN: Ferritin: 31 ng/mL (ref 11–307)

## 2023-11-20 MED ORDER — ACETAMINOPHEN 500 MG PO TABS
1000.0000 mg | ORAL_TABLET | Freq: Four times a day (QID) | ORAL | Status: DC
  Administered 2023-11-20 – 2023-11-21 (×4): 1000 mg via ORAL
  Filled 2023-11-20 (×5): qty 2

## 2023-11-20 MED ORDER — LIDOCAINE 5 % EX PTCH
1.0000 | MEDICATED_PATCH | Freq: Every day | CUTANEOUS | Status: DC
  Administered 2023-11-20 – 2023-11-21 (×2): 1 via TRANSDERMAL
  Filled 2023-11-20 (×2): qty 1

## 2023-11-20 MED ORDER — FERROUS SULFATE 325 (65 FE) MG PO TABS
325.0000 mg | ORAL_TABLET | Freq: Every day | ORAL | Status: DC
  Administered 2023-11-20: 325 mg via ORAL
  Filled 2023-11-20: qty 1

## 2023-11-20 MED ORDER — ONDANSETRON 4 MG PO TBDP: 4.0000 mg | ORAL_TABLET | Freq: Three times a day (TID) | ORAL | Status: DC | PRN

## 2023-11-20 MED ORDER — CEFADROXIL 500 MG PO CAPS
1000.0000 mg | ORAL_CAPSULE | Freq: Two times a day (BID) | ORAL | Status: DC
  Administered 2023-11-20 – 2023-11-21 (×2): 1000 mg via ORAL
  Filled 2023-11-20 (×2): qty 2

## 2023-11-20 NOTE — Assessment & Plan Note (Signed)
 Begin oral iron  supplementation

## 2023-11-20 NOTE — Assessment & Plan Note (Signed)
 Asthma: uses Albuterol  prn Seizures: no recent seizure activity, not on any medications

## 2023-11-20 NOTE — Plan of Care (Signed)

## 2023-11-20 NOTE — Plan of Care (Signed)
   Problem: Education: Goal: Knowledge of General Education information will improve Description: Including pain rating scale, medication(s)/side effects and non-pharmacologic comfort measures Outcome: Completed/Met

## 2023-11-20 NOTE — Assessment & Plan Note (Signed)
 Noted this morning, muscular in nature.  Pain control augmented as below -lidocaine  patch -heat/ice as desired -consider muscle relaxer if inadequate

## 2023-11-20 NOTE — Progress Notes (Addendum)
     Daily Progress Note Intern Pager: [phone]  Patient name: Kelly Huang Medical record number: [id number] Date of birth: 05/13/1986 Age: 38 y.o. Gender: female  Primary Care Provider: Patient, No Pcp Per Consultants: Urology Code Status: Full code  Pt Overview and Major Events to Date:  6/14: Admitted  Assessment and Plan:  This 38 year old female patient with a PMH of asthma and cocaine use admitted for abdominal pain secondary to acute pyelonephritis. Assessment & Plan Acute pyelonephritis Afebrile x 24 hours.  CTAP showed improvement of renal findings with no concern for abscess at this time.  Plan to transition to oral antibiotics today as below. - Contact urology as needed - Continue fluids - CTX for 3-days (6/14 - 6/17), status post flagyl (6/14 only)  -switch to cefadroxil 1g BID (6/17-6/20) to complete 7 day total course - Pain regimen: PO Tylenol  1000 mg q6h, Oxycodone  5mg  q4h prn - Miralax and Senna - Zofran  4 mg every 8 hours as needed for nausea/vomiting -Incentive spirometry to encourage respiration Substance use CIWA's documented overnight were 2 and 2.  RPR titer shows appropriate decrease, no treatment needed. -CIWAs could be discontinued given low risk of alcohol withdrawal Neck pain Noted this morning, muscular in nature.  Pain control augmented as below -lidocaine  patch -heat/ice as desired -consider muscle relaxer if inadequate Iron  deficiency anemia Begin oral iron  supplementation Chronic health problem Asthma: uses Albuterol  prn Seizures: no recent seizure activity, not on any medications  FEN/GI: Regular PPx: Lovenox  Dispo:Home pending clinical improvement .   Subjective:  Continues to have significant pain.  No acute overnight events  Objective: Temp:  [98.4 F (36.9 C)-98.9 F (37.2 C)] 98.8 F (37.1 C) (06/17 0334) Pulse Rate:  [72-89] 72 (06/17 0334) Resp:  [18-20] 18 (06/17 0334) BP: (90-104)/(44-51) 90/48 (06/17 0334) SpO2:   [98 %-100 %] 99 % (06/17 0334) Physical Exam: General: Well-appearing, no distress Cardiovascular: RRR, no M/R/G Respiratory: CTAB, no increased work of breathing Abdomen: Soft, diffuse tenderness.  Mild distention Extremities: 2+ pulses, no peripheral edema  Laboratory: Most recent CBC Lab Results  Component Value Date   WBC 7.1 11/20/2023   HGB 8.9 (L) 11/20/2023   HCT 30.8 (L) 11/20/2023   MCV 77.6 (L) 11/20/2023   PLT 568 (H) 11/20/2023   Most recent BMP    Latest Ref Rng & Units 11/19/2023    5:22 AM  BMP  Glucose 70 - 99 mg/dL 94   BUN 6 - 20 mg/dL 7   Creatinine 1.47 - 8.29 mg/dL 5.62   Sodium 130 - 865 mmol/L 139   Potassium 3.5 - 5.1 mmol/L 4.1   Chloride 98 - 111 mmol/L 101   CO2 22 - 32 mmol/L 29   Calcium  8.9 - 10.3 mg/dL 8.3     Rayma Calandra, DO 11/20/2023, 7:48 AM  PGY-1, [facility] FPTS Intern pager: [phone], text pages welcome Secure chat group [facility] Teaching Service

## 2023-11-20 NOTE — Assessment & Plan Note (Signed)
 CIWA's documented overnight were 2 and 2.  RPR titer shows appropriate decrease, no treatment needed. -CIWAs could be discontinued given low risk of alcohol withdrawal

## 2023-11-20 NOTE — Assessment & Plan Note (Addendum)
 Afebrile x 24 hours.  CTAP showed improvement of renal findings with no concern for abscess at this time.  Plan to transition to oral antibiotics today as below. - Contact urology as needed - Continue fluids - CTX for 3-days (6/14 - 6/17), status post flagyl (6/14 only)  -switch to cefadroxil 1g BID (6/17-6/20) to complete 7 day total course - Pain regimen: PO Tylenol  1000 mg q6h, Oxycodone  5mg  q4h prn - Miralax and Senna - Zofran  4 mg every 8 hours as needed for nausea/vomiting -Incentive spirometry to encourage respiration

## 2023-11-21 ENCOUNTER — Other Ambulatory Visit (HOSPITAL_COMMUNITY): Payer: Self-pay

## 2023-11-21 ENCOUNTER — Encounter (HOSPITAL_COMMUNITY): Payer: Self-pay | Admitting: Family Medicine

## 2023-11-21 DIAGNOSIS — N10 Acute tubulo-interstitial nephritis: Secondary | ICD-10-CM | POA: Diagnosis not present

## 2023-11-21 DIAGNOSIS — R09A2 Foreign body sensation, throat: Secondary | ICD-10-CM | POA: Insufficient documentation

## 2023-11-21 LAB — CBC
HCT: 32.1 % — ABNORMAL LOW (ref 36.0–46.0)
Hemoglobin: 9 g/dL — ABNORMAL LOW (ref 12.0–15.0)
MCH: 22.1 pg — ABNORMAL LOW (ref 26.0–34.0)
MCHC: 28 g/dL — ABNORMAL LOW (ref 30.0–36.0)
MCV: 78.7 fL — ABNORMAL LOW (ref 80.0–100.0)
Platelets: 558 10*3/uL — ABNORMAL HIGH (ref 150–400)
RBC: 4.08 MIL/uL (ref 3.87–5.11)
RDW: 16.9 % — ABNORMAL HIGH (ref 11.5–15.5)
WBC: 8.4 10*3/uL (ref 4.0–10.5)
nRBC: 0 % (ref 0.0–0.2)

## 2023-11-21 MED ORDER — POLYETHYLENE GLYCOL 3350 17 GM/SCOOP PO POWD
17.0000 g | Freq: Every day | ORAL | 0 refills | Status: AC
  Filled 2023-11-21: qty 238, 14d supply, fill #0

## 2023-11-21 MED ORDER — SENNA 8.6 MG PO TABS
1.0000 | ORAL_TABLET | Freq: Every day | ORAL | 0 refills | Status: AC
  Filled 2023-11-21: qty 120, 120d supply, fill #0

## 2023-11-21 MED ORDER — OXYCODONE HCL 5 MG PO TABS
5.0000 mg | ORAL_TABLET | Freq: Four times a day (QID) | ORAL | 0 refills | Status: DC | PRN
  Filled 2023-11-21: qty 20, 5d supply, fill #0

## 2023-11-21 MED ORDER — FERROUS SULFATE 325 (65 FE) MG PO TABS
325.0000 mg | ORAL_TABLET | ORAL | 0 refills | Status: AC
  Filled 2023-11-21: qty 30, 70d supply, fill #0

## 2023-11-21 MED ORDER — FERROUS SULFATE 325 (65 FE) MG PO TABS: 325.0000 mg | ORAL_TABLET | ORAL | Status: DC

## 2023-11-21 MED ORDER — CEFADROXIL 500 MG PO CAPS
1000.0000 mg | ORAL_CAPSULE | Freq: Two times a day (BID) | ORAL | 0 refills | Status: AC
  Filled 2023-11-21: qty 20, 5d supply, fill #0

## 2023-11-21 NOTE — Assessment & Plan Note (Signed)
 Transition to p.o. antibiotics yesterday, could consider discharge home if pain is under control today. - Contact urology as needed - Continue fluids - CTX for 3-days (6/14 - 6/17), status post flagyl (6/14 only)  -switch to cefadroxil 1g BID (6/17-6/20) to complete 7 day total course - Pain regimen: PO Tylenol  1000 mg q6h, Oxycodone  5mg  q4h prn - Miralax and Senna - Zofran  4 mg every 8 hours as needed for nausea/vomiting -Incentive spirometry to encourage respiration

## 2023-11-21 NOTE — TOC Transition Note (Signed)
 Transition of Care [facility]) - Discharge Note   Patient Details  Name: Kelly Huang MRN: [id number] Date of Birth: Jul 04, 1985  Transition of Care [facility]) CM/SW Contact:  Tom-Johnson, Kendarius Vigen Daphne, RN Phone Number: 11/21/2023, 1:36 PM   Clinical Narrative:     Patient is scheduled for discharge today.  Readmission Risk Assessment done. Patient will return to her current motel at discharge.  New patient establishment, hospital f/u and discharge instructions on AVS. Cab voucher will be given to patient at the discharge lounge to transport at discharge.  No further TOC needs noted.       Final next level of care: Home/Self Care Barriers to Discharge: Barriers Resolved   Patient Goals and CMS Choice Patient states their goals for this hospitalization and ongoing recovery are:: To return home CMS Medicare.gov Compare Post Acute Care list provided to:: Patient Choice offered to / list presented to : Patient      Discharge Placement                Patient to be transferred to facility by: [facility]      Discharge Plan and Services Additional resources added to the After Visit Summary for                  DME Arranged: N/A DME Agency: NA       HH Arranged: NA HH Agency: NA        Social Drivers of Health (SDOH) Interventions SDOH Screenings   Food Insecurity: No Food Insecurity (11/17/2023)  Housing: High Risk (11/17/2023)  Transportation Needs: No Transportation Needs (11/17/2023)  Utilities: Not At Risk (11/17/2023)  Depression (PHQ2-9): Medium Risk (03/01/2022)  Social Connections: Unknown (11/21/2022)   Received from [facility]  Tobacco Use: High Risk (11/17/2023)     Readmission Risk Interventions    11/19/2023    3:22 PM  Readmission Risk Prevention Plan  Transportation Screening Complete  PCP or Specialist Appt within 5-7 Days Complete  Home Care Screening Complete  Medication Review (RN CM) Referral to Pharmacy

## 2023-11-21 NOTE — Discharge Summary (Signed)
 Family Medicine Teaching [facility] Discharge Summary  Patient name: Kelly Huang Medical record number: [id number] Date of birth: 08-Aug-1985 Age: 38 y.o. Gender: female Date of Admission: 11/17/2023  Date of Discharge: 11/21/2023 Admitting Physician: Clyda Dark, DO  Primary Care Provider: Patient, No Pcp Per Consultants: Urology  Indication for Hospitalization: Pyelonephritis  Discharge Diagnoses/Problem List:  Principal Problem for Admission: Acute pyelonephritis Other Problems addressed during stay:  Principal Problem:   Acute pyelonephritis Active Problems:   Substance use   Chronic health problem   Iron  deficiency anemia   Globus sensation    Brief Hospital Course:  Kelly Huang is a 38 y.o.female with a history of asthma, seizures who was admitted to the [facility] Medicine Teaching Service at [facility] for acute pyelonephritis. Her hospital course is detailed below:  Acute Pyelonephritis Patient presented with abdominal pain and fever after about 2 weeks of urinary symptoms.  She was seen in the ED twice prior but was unfortunately unable to be reached by team with positive urine culture and recommendation for antibiotic treatment.  She was found to have a leukocytosis on third presentation with CT findings of acute pyelonephritis and possible small developing renal abscess.  She was admitted on Rocephin , fluids, Zofran  for nausea, and opiates for pain control. CTAP on day 3 of treatment showed improvement of enhancement and infectious signs in the kidneys, patient was transitioned to oral antibiotics (cefadroxil) to complete a 10 day total course ending on 6/23. She was discharged once her pain was appropriately controlled with oral pain medications.   Substance use Patient reported use of alcohol, tobacco, and cocaine, with most recent cocaine use 2 days prior.  Given alcohol use 1-2 times per week, she was felt to be lower risk for alcohol withdrawal in the hospital.   UDS was positive for cocaine.  TOC was consulted for substance use resources.  Iron  deficiency Anemia Noted on CBC, iron  studies indicated iron  deficiency. Patient was started on iron  supplementation 3 times weekly.  Other chronic conditions were medically managed with home medications and formulary alternatives as necessary (Asthma)  PCP Follow-up Recommendations: Follow up anemia with regular CBC/iron  panel Discuss substance use   Disposition: Home  Discharge Condition: Improved  Discharge Exam:  Vitals:   11/20/23 2057 11/21/23 0513  BP: 117/72 (!) 89/52  Pulse: 85 74  Resp: 20 20  Temp: 98.6 F (37 C) 98.7 F (37.1 C)  SpO2: 99% 98%   General: A&O, NAD Cardiac: RRR, no m/r/g Respiratory: CTAB, normal WOB, no w/c/r GI: Soft, NTTP, non-distended  Extremities: NTTP, no peripheral edema.  Significant Procedures: None  Significant Labs and Imaging:  Recent Labs  Lab 11/20/23 0554 11/21/23 0519  WBC 7.1 8.4  HGB 8.9* 9.0*  HCT 30.8* 32.1*  PLT 568* 558*   No results for input(s): NA, K, CL, CO2, GLUCOSE, BUN, CREATININE, CALCIUM , MG, PHOS, ALKPHOS, AST, ALT, ALBUMIN, PROTEIN in the last 48 hours.   Results/Tests Pending at Time of Discharge: none  Discharge Medications:  Allergies as of 11/21/2023       Reactions   Bactrim [sulfamethoxazole-trimethoprim] Other (See Comments)   Dizziness         Medication List     TAKE these medications    acetaminophen  500 MG tablet Commonly known as: TYLENOL  Take 1,000 mg by mouth 2 (two) times daily as needed for moderate pain (pain score 4-6), fever or headache.   cefadroxil 500 MG capsule Commonly known as: DURICEF Take 2 capsules (1,000 mg  total) by mouth 2 (two) times daily for 5 days.   ferrous sulfate 325 (65 FE) MG tablet Take 1 tablet (325 mg total) by mouth 3 (three) times a week. Start taking on: November 23, 2023   oxyCODONE  5 MG immediate release tablet Commonly known  as: Oxy IR/ROXICODONE  Take 1 tablet (5 mg total) by mouth every 6 (six) hours as needed for moderate pain (pain score 4-6) or breakthrough pain.   polyethylene glycol 17 g packet Commonly known as: MIRALAX / GLYCOLAX Take 17 g by mouth daily. Start taking on: November 22, 2023   senna 8.6 MG Tabs tablet Commonly known as: SENOKOT Take 1 tablet (8.6 mg total) by mouth daily. Start taking on: November 22, 2023        Discharge Instructions: Please refer to Patient Instructions section of EMR for full details.  Patient was counseled important signs and symptoms that should prompt return to medical care, changes in medications, dietary instructions, activity restrictions, and follow up appointments.   Follow-Up Appointments:  Follow-up Information     [facility] Follow up.   Specialty: Internal Medicine Contact information: [address] [place]  [address] [phone]                Rayma Calandra, DO 11/21/2023, 1:12 PM PGY-1, [facility] Family Medicine

## 2023-11-21 NOTE — Assessment & Plan Note (Signed)
 Ferrous sulfate 325 mg 3 times weekly, MWF

## 2023-11-21 NOTE — Evaluation (Signed)
 Clinical/Bedside Swallow Evaluation Patient Details  Name: Kelly Huang MRN: [id number] Date of Birth: Feb 16, 1986  Today's Date: 11/21/2023 Time: SLP Start Time (ACUTE ONLY): 1149 SLP Stop Time (ACUTE ONLY): 1159 SLP Time Calculation (min) (ACUTE ONLY): 10 min  Past Medical History:  Past Medical History:  Diagnosis Date   Alcohol use complicating pregnancy 02/18/2022   Asthma    Asthma affecting pregnancy in second trimester 03/01/2022   Cocaine use complicating pregnancy 02/18/2022   Herniated lumbar intervertebral disc    Seizures (HCC)    Past Surgical History: History reviewed. No pertinent surgical history. HPI:  Kelly Huang is a 38 yo female with a PMH of asthma and cocaine use admitted with abdominal pain secondary to acute pyelonephritis. SLP consulted due to odynophagia.    Assessment / Plan / Recommendation  Clinical Impression  Pt reports odynophagia after taking pills previous date that now presents as a globus sensation. She reports frequent perception of esophageal reflux. No s/s of dysphagia or aspiration were observed with thin liquids, purees, or solids. Discussed trial of softer foods and taking meds with puree, with which pt is agreeable. SLP will f/u at least briefly, although post-acute needs are not anticipated. SLP Visit Diagnosis: Dysphagia, unspecified (R13.10)    Aspiration Risk  No limitations    Diet Recommendation Regular;Thin liquid    Liquid Administration via: Cup;Straw Medication Administration: Whole meds with puree Supervision: Patient able to self feed Compensations: Slow rate;Small sips/bites Postural Changes: Seated upright at 90 degrees;Remain upright for at least 30 minutes after po intake    Other  Recommendations Oral Care Recommendations: Oral care BID     Assistance Recommended at Discharge    Functional Status Assessment Patient has had a recent decline in their functional status and demonstrates the ability to make  significant improvements in function in a reasonable and predictable amount of time.  Frequency and Duration min 2x/week  1 week       Prognosis Prognosis for improved oropharyngeal function: Good Barriers to Reach Goals: Behavior      Swallow Study   General HPI: Kelly Huang is a 38 yo female with a PMH of asthma and cocaine use admitted with abdominal pain secondary to acute pyelonephritis. SLP consulted due to odynophagia. Type of Study: Bedside Swallow Evaluation Previous Swallow Assessment: none in chart Diet Prior to this Study: Regular;Thin liquids (Level 0) Temperature Spikes Noted: No Respiratory Status: Room air History of Recent Intubation: No Behavior/Cognition: Alert;Cooperative;Pleasant mood Oral Cavity Assessment: Within Functional Limits Oral Care Completed by SLP: No Oral Cavity - Dentition: Poor condition;Missing dentition Vision: Functional for self-feeding Self-Feeding Abilities: Able to feed self Patient Positioning: Upright in bed Baseline Vocal Quality: Normal Volitional Cough: Strong Volitional Swallow: Able to elicit    Oral/Motor/Sensory Function Overall Oral Motor/Sensory Function: Within functional limits   Ice Chips Ice chips: Not tested   Thin Liquid Thin Liquid: Within functional limits Presentation: Cup;Self Fed    Nectar Thick Nectar Thick Liquid: Not tested   Honey Thick Honey Thick Liquid: Not tested   Puree Puree: Within functional limits Presentation: Spoon;Self Fed   Solid     Solid: Within functional limits Presentation: Spoon;Self Fed      Amil Kale, M.A., CCC-SLP Speech Language Pathology, Acute Rehabilitation Services  Secure Chat preferred [phone]  11/21/2023,12:22 PM

## 2023-11-21 NOTE — Plan of Care (Signed)
   Problem: Health Behavior/Discharge Planning: Goal: Ability to manage health-related needs will improve Outcome: Completed/Met

## 2023-11-21 NOTE — Assessment & Plan Note (Signed)
 Stable.  Continue thiamine supplementation and multivitamin supplementation.

## 2023-11-21 NOTE — Assessment & Plan Note (Signed)
 Asthma: uses Albuterol  prn Seizures: no recent seizure activity, not on any medications

## 2023-11-21 NOTE — Discharge Instructions (Addendum)
 Dear Kelly Huang,  Thank you for letting us  participate in your care. You were hospitalized for a kidney infection and diagnosed with Acute pyelonephritis. You were treated with IV antibiotics.   POST-HOSPITAL & CARE INSTRUCTIONS You are now on oral antibiotics please continue to take this as directed below. If you have worsening fevers, nausea, vomiting, or severe kidney pain please return to care again. Go to your follow up appointments (listed below)   DOCTOR'S APPOINTMENT   Future Appointments  Date Time Provider Department Center  12/06/2023  1:20 PM Medina-Vargas, Monina C, NP [facility] None    Follow-up Information     [name] Follow up.   Specialty: Internal Medicine Contact information: [address] [place]  [address] [phone]                Take care and be well!  Family Medicine Teaching Service Inpatient Team [place]  [facility]  [address] [place], [place] [address] [phone]

## 2023-11-21 NOTE — Assessment & Plan Note (Addendum)
 Resolved. -lidocaine  patch -heat/ice as desired - Consider muscle relaxer if inadequate

## 2023-11-21 NOTE — Progress Notes (Signed)
     Daily Progress Note Intern Pager: [phone]  Patient name: Kelly Huang Medical record number: [id number] Date of birth: 1985/08/28 Age: 38 y.o. Gender: female  Primary Care Provider: Patient, No Pcp Per Consultants: Urology Code Status: Full code  Pt Overview and Major Events to Date:  6/14: Admitted  Assessment and Plan:  This is a 38 year old female patient presenting with abdominal pain secondary to acute pyelonephritis.  CTAP recently showed improvement of her pyelonephritis.  PMH includes asthma and cocaine use. Assessment & Plan Acute pyelonephritis Transition to p.o. antibiotics yesterday, could consider discharge home if pain is under control today. - Contact urology as needed - Continue fluids - CTX for 3-days (6/14 - 6/17), status post flagyl (6/14 only)  -switch to cefadroxil 1g BID (6/17-6/20) to complete 7 day total course - Pain regimen: PO Tylenol  1000 mg q6h, Oxycodone  5mg  q4h prn - Miralax and Senna - Zofran  4 mg every 8 hours as needed for nausea/vomiting -Incentive spirometry to encourage respiration Substance use Stable.  Continue thiamine supplementation and multivitamin supplementation. Neck pain (Resolved: 11/21/2023) Resolved. -lidocaine  patch -heat/ice as desired - Consider muscle relaxer if inadequate Iron  deficiency anemia Ferrous sulfate 325 mg 3 times weekly, MWF Globus sensation Noted today.  SLP consult to evaluate swallow. Chronic health problem Asthma: uses Albuterol  prn Seizures: no recent seizure activity, not on any medications  FEN/GI: Regular PPx: Lovenox  Dispo:Home pending clinical improvement .   Subjective:  Patient appears improved as yesterday.  She reports that her pain is better controlled and she is overall feeling better.  Objective: Temp:  [98.2 F (36.8 C)-99 F (37.2 C)] 98.7 F (37.1 C) (06/18 0513) Pulse Rate:  [72-98] 74 (06/18 0513) Resp:  [18-20] 20 (06/18 0513) BP: (89-117)/(52-72) 89/52 (06/18  0513) SpO2:  [98 %-100 %] 98 % (06/18 0513) Physical Exam: General: Well-appearing, no distress Cardiovascular: RRR, no M/R/G Respiratory: CTAB, no increased work of breathing Abdomen: Flat, soft.  Tenderness to palpation over the right flank Extremities: 2+ pulses x 4  Laboratory: Most recent CBC Lab Results  Component Value Date   WBC 8.4 11/21/2023   HGB 9.0 (L) 11/21/2023   HCT 32.1 (L) 11/21/2023   MCV 78.7 (L) 11/21/2023   PLT 558 (H) 11/21/2023   Most recent BMP    Latest Ref Rng & Units 11/19/2023    5:22 AM  BMP  Glucose 70 - 99 mg/dL 94   BUN 6 - 20 mg/dL 7   Creatinine 6.29 - 5.28 mg/dL 4.13   Sodium 244 - 010 mmol/L 139   Potassium 3.5 - 5.1 mmol/L 4.1   Chloride 98 - 111 mmol/L 101   CO2 22 - 32 mmol/L 29   Calcium  8.9 - 10.3 mg/dL 8.3     Kelly Calandra, DO 11/21/2023, 7:59 AM  PGY-1, [place] [facility] FPTS Intern pager: [phone], text pages welcome Secure chat group [place] [facility] Teaching Service

## 2023-11-21 NOTE — Assessment & Plan Note (Signed)
 Noted today.  SLP consult to evaluate swallow.

## 2023-11-22 LAB — CULTURE, BLOOD (ROUTINE X 2)
Culture: NO GROWTH
Culture: NO GROWTH
Special Requests: ADEQUATE
Special Requests: ADEQUATE

## 2023-11-29 ENCOUNTER — Emergency Department (HOSPITAL_COMMUNITY)
Admission: EM | Admit: 2023-11-29 | Discharge: 2023-11-30 | Disposition: A | Discharge status: Home / Self Care | Attending: Emergency Medicine | Admitting: Emergency Medicine

## 2023-11-29 ENCOUNTER — Encounter (HOSPITAL_COMMUNITY): Payer: Self-pay

## 2023-11-29 DIAGNOSIS — R519 Headache, unspecified: Secondary | ICD-10-CM | POA: Diagnosis not present

## 2023-11-29 DIAGNOSIS — D649 Anemia, unspecified: Secondary | ICD-10-CM

## 2023-11-29 DIAGNOSIS — D75839 Thrombocytosis, unspecified: Secondary | ICD-10-CM

## 2023-11-29 DIAGNOSIS — N390 Urinary tract infection, site not specified: Secondary | ICD-10-CM | POA: Diagnosis not present

## 2023-11-29 DIAGNOSIS — Z91148 Patient's other noncompliance with medication regimen for other reason: Secondary | ICD-10-CM

## 2023-11-29 DIAGNOSIS — R109 Unspecified abdominal pain: Secondary | ICD-10-CM | POA: Diagnosis present

## 2023-11-29 LAB — BASIC METABOLIC PANEL WITH GFR
Anion gap: 12 (ref 5–15)
BUN: 12 mg/dL (ref 6–20)
CO2: 22 mmol/L (ref 22–32)
Calcium: 8.7 mg/dL — ABNORMAL LOW (ref 8.9–10.3)
Chloride: 106 mmol/L (ref 98–111)
Creatinine, Ser: 0.91 mg/dL (ref 0.44–1.00)
GFR, Estimated: >60 mL/min (ref >60)
Glucose, Bld: 103 mg/dL — ABNORMAL HIGH (ref 70–99)
Potassium: 3.8 mmol/L (ref 3.5–5.1)
Sodium: 140 mmol/L (ref 135–145)

## 2023-11-29 LAB — CBC
HCT: 33.1 % — ABNORMAL LOW (ref 36.0–46.0)
Hemoglobin: 9.4 g/dL — ABNORMAL LOW (ref 12.0–15.0)
MCH: 22.2 pg — ABNORMAL LOW (ref 26.0–34.0)
MCHC: 28.4 g/dL — ABNORMAL LOW (ref 30.0–36.0)
MCV: 78.3 fL — ABNORMAL LOW (ref 80.0–100.0)
Platelets: 701 10*3/uL — ABNORMAL HIGH (ref 150–400)
RBC: 4.23 MIL/uL (ref 3.87–5.11)
RDW: 17.2 % — ABNORMAL HIGH (ref 11.5–15.5)
WBC: 5.8 10*3/uL (ref 4.0–10.5)
nRBC: 0 % (ref 0.0–0.2)

## 2023-11-29 LAB — HCG, SERUM, QUALITATIVE: Preg, Serum: NEGATIVE

## 2023-11-29 NOTE — ED Triage Notes (Signed)
 Pt is coming in for a Kindey cyst, she has been prescribed Abx for said cst. She has not been taking the medications for the cyst, but she nausea, vomiting and abd pain. She is otherwise stable with no other complaints at this time.   Medic vitals   136/74 72hr 99%ra 110bgl

## 2023-11-29 NOTE — ED Provider Triage Note (Signed)
 Emergency Medicine Provider Triage Evaluation Note  Quenna Doepke , a 38 y.o. female  was evaluated in triage.  Pt complains of Here for  imigraine headache and kidney pain which she describes as pain from her bellybutton down in the front and back.  No urinary or vaginal symptoms.  Review of Systems  Positive: Back pain Negative: Fever  Physical Exam  BP 129/73   Pulse 80   Temp 98.2 F (36.8 C) (Oral)   Resp 18   LMP 10/18/2023   SpO2 100%  Gen:   Awake, no distress   Resp:  Normal effort  MSK:   Moves extremities without difficulty  Other:    Medical Decision Making  Medically screening exam initiated at 8:04 PM.  Appropriate orders placed.  Alanya Vukelich was informed that the remainder of the evaluation will be completed by another provider, this initial triage assessment does not replace that evaluation, and the importance of remaining in the ED until their evaluation is complete.     Arloa Chroman, PA-C 11/29/23 2007

## 2023-11-30 ENCOUNTER — Other Ambulatory Visit (HOSPITAL_COMMUNITY): Payer: Self-pay

## 2023-11-30 ENCOUNTER — Emergency Department (HOSPITAL_COMMUNITY)

## 2023-11-30 LAB — URINALYSIS, ROUTINE W REFLEX MICROSCOPIC
Bacteria, UA: NONE SEEN
Bilirubin Urine: NEGATIVE
Glucose, UA: 50 mg/dL — AB
Hgb urine dipstick: NEGATIVE
Ketones, ur: NEGATIVE mg/dL
Nitrite: NEGATIVE
Protein, ur: >=300 mg/dL — AB
Specific Gravity, Urine: 1.018 (ref 1.005–1.030)
WBC, UA: >50 WBC/hpf (ref 0–5)
pH: 6 (ref 5.0–8.0)

## 2023-11-30 MED ORDER — KETOROLAC TROMETHAMINE 30 MG/ML IJ SOLN
15.0000 mg | Freq: Once | INTRAMUSCULAR | Status: AC
  Administered 2023-11-30: 15 mg via INTRAVENOUS
  Filled 2023-11-30: qty 1

## 2023-11-30 MED ORDER — METOCLOPRAMIDE HCL 5 MG/ML IJ SOLN
10.0000 mg | Freq: Once | INTRAMUSCULAR | Status: AC
  Administered 2023-11-30: 10 mg via INTRAVENOUS
  Filled 2023-11-30: qty 2

## 2023-11-30 MED ORDER — CEFDINIR 300 MG PO CAPS
300.0000 mg | ORAL_CAPSULE | Freq: Two times a day (BID) | ORAL | 0 refills | Status: DC
  Filled 2023-11-30: qty 14, 7d supply, fill #0

## 2023-11-30 MED ORDER — IOHEXOL 350 MG/ML SOLN
75.0000 mL | Freq: Once | INTRAVENOUS | Status: AC | PRN
  Administered 2023-11-30: 75 mL via INTRAVENOUS

## 2023-11-30 MED ORDER — SODIUM CHLORIDE 0.9 % IV SOLN
2.0000 g | Freq: Once | INTRAVENOUS | Status: AC
  Administered 2023-11-30: 2 g via INTRAVENOUS
  Filled 2023-11-30: qty 20

## 2023-11-30 NOTE — ED Provider Notes (Signed)
 Signed out by Dr Haze that ED workup, labs/ct, iv abx, etc,  completed, and that plan is for patient to be d/c'd, call back/chat from [facility] pending for assistance with  home med/antibiotic.   TOC team indicates patient can get rx filled at [facility] pharmacy.  Recheck pt, vitals normal, pt comfortable, no distress, no nv. Pt indicates will pick up antibiotic there and take as directed. Pt currently appears stable for ED. Return precautions provided.    Bernard Drivers, MD 11/30/23 [phone]

## 2023-11-30 NOTE — Care Management (Signed)
 Transition of Care [facility]) - Emergency Department Mini Assessment   Patient Details  Name: Vanecia Limpert MRN: [id number] Date of Birth: 01-09-1986  Transition of Care [facility]) CM/SW Contact:    Corean JAYSON Canary, RN Phone Number: 11/30/2023, 8:18 AM   Clinical Narrative:  Patient returned to ED after being hospitalized for kidney infection a couple of weeks ago. Documented  that she lost her antibiotics so she did not finish them. She has insurance and is not eligible for [facility] medication assistance Messaged with provider and nursing please send medications to [facility] pharmacy, they will work with patient  ED Mini Assessment: What brought you to the Emergency Department? : lost her antibiotics needs to complete     Barrier interventions: recommendation to [facility] pharmacy     Interventions which prevented an admission or readmission: Medication Review    Patient Contact and Communications        ,                 Admission diagnosis:  Kidney pain Patient Active Problem List   Diagnosis Date Noted   Globus sensation 11/21/2023   Iron  deficiency anemia 11/20/2023   Acute pyelonephritis 11/17/2023   Chronic health problem 11/17/2023   CAP (community acquired pneumonia) 05/29/2022   Homeless 05/29/2022   Postpartum care following vaginal delivery 04/17/2022   Postpartum depression 04/17/2022   History of seizure 02/19/2022   History of IUFD 02/19/2022   Cocaine abuse (HCC) 02/18/2022   Substance use 02/18/2022   PCP:  Patient, No Pcp Per Pharmacy:   [id number] [place], [place] - [address] AT [facility] [address] [place] [address] Phone: [phone] Fax: [phone]

## 2023-11-30 NOTE — ED Provider Notes (Signed)
 [place] EMERGENCY DEPARTMENT AT [facility] Provider Note   CSN: [id number] Arrival date & time: 11/29/23  8062     Patient presents with: Flank Pain   Kelly Huang is a 38 y.o. female.   Patient presents to the emergency department for evaluation of headache and flank pain.  Patient reports that she was recently hospitalized and treated with antibiotics for a kidney infection.  She did not complete her outpatient antibiotics because she lost them.       Prior to Admission medications   Medication Sig Start Date End Date Taking? Authorizing Provider  acetaminophen  (TYLENOL ) 500 MG tablet Take 1,000 mg by mouth 2 (two) times daily as needed for moderate pain (pain score 4-6), fever or headache.    [provider]  ferrous sulfate  325 (65 FE) MG tablet Take 1 tablet (325 mg total) by mouth 3 (three) times a week. 11/23/23   Alba Sharper, MD  oxyCODONE  (OXY IR/ROXICODONE ) 5 MG immediate release tablet Take 1 tablet (5 mg total) by mouth every 6 (six) hours as needed for moderate pain (pain score 4-6) or breakthrough pain. 11/21/23   Quillen, Michael, MD  polyethylene glycol powder (GLYCOLAX /MIRALAX ) 17 GM/SCOOP powder Take 1 capful (17 g) by mouth daily. 11/22/23   Quillen, Michael, MD  senna (SENOKOT) 8.6 MG TABS tablet Take 1 tablet (8.6 mg total) by mouth daily. 11/22/23   Alba Sharper, MD    Allergies: Bactrim [sulfamethoxazole-trimethoprim]    Review of Systems  Updated Vital Signs BP 118/84   Pulse (!) 57   Temp 99.6 F (37.6 C)   Resp 18   LMP 10/18/2023   SpO2 100%   Physical Exam Vitals and nursing note reviewed.  Constitutional:      General: She is not in acute distress.    Appearance: She is well-developed.  HENT:     Head: Normocephalic and atraumatic.     Mouth/Throat:     Mouth: Mucous membranes are moist.   Eyes:     General: Vision grossly intact. Gaze aligned appropriately.     Extraocular Movements: Extraocular  movements intact.     Conjunctiva/sclera: Conjunctivae normal.    Cardiovascular:     Rate and Rhythm: Normal rate and regular rhythm.     Pulses: Normal pulses.     Heart sounds: Normal heart sounds, S1 normal and S2 normal. No murmur heard.    No friction rub. No gallop.  Pulmonary:     Effort: Pulmonary effort is normal. No respiratory distress.     Breath sounds: Normal breath sounds.  Abdominal:     General: Bowel sounds are normal.     Palpations: Abdomen is soft.     Tenderness: There is no abdominal tenderness. There is no guarding or rebound.     Hernia: No hernia is present.   Musculoskeletal:        General: No swelling.     Cervical back: Full passive range of motion without pain, normal range of motion and neck supple. No spinous process tenderness or muscular tenderness. Normal range of motion.     Right lower leg: No edema.     Left lower leg: No edema.   Skin:    General: Skin is warm and dry.     Capillary Refill: Capillary refill takes less than 2 seconds.     Findings: No ecchymosis, erythema, rash or wound.   Neurological:     General: No focal deficit present.  Mental Status: She is alert and oriented to person, place, and time.     GCS: GCS eye subscore is 4. GCS verbal subscore is 5. GCS motor subscore is 6.     Cranial Nerves: Cranial nerves 2-12 are intact.     Sensory: Sensation is intact.     Motor: Motor function is intact.     Coordination: Coordination is intact.   Psychiatric:        Attention and Perception: Attention normal.        Mood and Affect: Mood normal.        Speech: Speech normal.        Behavior: Behavior normal.     (all labs ordered are listed, but only abnormal results are displayed) Labs Reviewed  URINALYSIS, ROUTINE W REFLEX MICROSCOPIC - Abnormal; Notable for the following components:      Result Value   APPearance CLOUDY (*)    Glucose, UA 50 (*)    Protein, ur >=300 (*)    Leukocytes,Ua MODERATE (*)    Non  Squamous Epithelial 0-5 (*)    All other components within normal limits  BASIC METABOLIC PANEL WITH GFR - Abnormal; Notable for the following components:   Glucose, Bld 103 (*)    Calcium  8.7 (*)    All other components within normal limits  CBC - Abnormal; Notable for the following components:   Hemoglobin 9.4 (*)    HCT 33.1 (*)    MCV 78.3 (*)    MCH 22.2 (*)    MCHC 28.4 (*)    RDW 17.2 (*)    Platelets 701 (*)    All other components within normal limits  URINE CULTURE  HCG, SERUM, QUALITATIVE    EKG: None  Radiology: No results found.   Procedures   Medications Ordered in the ED  cefTRIAXone  (ROCEPHIN ) 2 g in sodium chloride  0.9 % 100 mL IVPB (2 g Intravenous New Bag/Given 11/30/23 9366)  ketorolac  (TORADOL ) 30 MG/ML injection 15 mg (has no administration in time range)  metoCLOPramide  (REGLAN ) injection 10 mg (has no administration in time range)                                    Medical Decision Making Amount and/or Complexity of Data Reviewed External Data Reviewed: labs, radiology and notes. Labs: ordered. Decision-making details documented in ED Course. Radiology: ordered.   Differential Diagnosis considered includes, but not limited to: Renal colic/kidney stone; pyelonephritis; aortic dissection; musculoskeletal pain.  Presents to the emergency department for evaluation of persistent flank pain.  Record review reveals that she was hospitalized with pyelonephritis and possible renal abscess.  She was treated with IV antibiotics and discharged with oral antibiotics.  It sounds as though she did not complete the course of antibiotics.  Patient's labs are unremarkable.  Vital signs are normal.  Will perform CT scan to ensure that she does not have worsening renal abscess.  Urinalysis looks persistently infected.  Will reculture, culture from admission showed sensitivity to Keflex  and Rocephin .  She was given Rocephin  2 g IV.    Patient with headache.  Not a  thunderclap headache, no red flags.  Normal neurologic exam.  Treat with migraine cocktail.  Will sign out to oncoming ER physician to follow-up CT results.     Final diagnoses:  None    ED Discharge Orders     None  Haze Lonni PARAS, MD 11/30/23 [phone]

## 2023-11-30 NOTE — Discharge Instructions (Addendum)
 It was our pleasure to provide your ER care today - we hope that you feel better.  Drink plenty of fluids/stay well hydrated.   Take antibiotic as prescribed - make sure to pick up antibiotic now, at the [facility] pharmacy, before you leave the hospital/medical center.   Follow up closely with primary care doctor in the next 3-4 days.   Return to ER right away if worse, new symptoms, fevers, new or worsening or severe pain/abdominal pain, persistent vomiting, chest pain, trouble breathing, weak/fainting, or other concern.

## 2023-12-06 ENCOUNTER — Encounter: Payer: Self-pay | Admitting: Adult Health

## 2023-12-06 NOTE — Progress Notes (Signed)
 This encounter was created in error - please disregard.

## 2024-01-24 ENCOUNTER — Ambulatory Visit: Payer: Self-pay | Admitting: Nurse Practitioner

## 2024-02-17 ENCOUNTER — Emergency Department (HOSPITAL_COMMUNITY)

## 2024-02-17 ENCOUNTER — Emergency Department (HOSPITAL_COMMUNITY)
Admission: EM | Admit: 2024-02-17 | Discharge: 2024-02-17 | Disposition: A | Discharge status: Home / Self Care | Attending: Emergency Medicine | Admitting: Emergency Medicine

## 2024-02-17 ENCOUNTER — Encounter (HOSPITAL_COMMUNITY): Payer: Self-pay

## 2024-02-17 ENCOUNTER — Other Ambulatory Visit: Payer: Self-pay

## 2024-02-17 DIAGNOSIS — K5900 Constipation, unspecified: Secondary | ICD-10-CM | POA: Diagnosis not present

## 2024-02-17 DIAGNOSIS — R109 Unspecified abdominal pain: Secondary | ICD-10-CM | POA: Diagnosis present

## 2024-02-17 LAB — I-STAT CHEM 8, ED
BUN: 10 mg/dL (ref 6–20)
Calcium, Ion: 1.15 mmol/L (ref 1.15–1.40)
Chloride: 106 mmol/L (ref 98–111)
Creatinine, Ser: 0.7 mg/dL (ref 0.44–1.00)
Glucose, Bld: 79 mg/dL (ref 70–99)
HCT: 39 % (ref 36.0–46.0)
Hemoglobin: 13.3 g/dL (ref 12.0–15.0)
Potassium: 4.3 mmol/L (ref 3.5–5.1)
Sodium: 141 mmol/L (ref 135–145)
TCO2: 25 mmol/L (ref 22–32)

## 2024-02-17 LAB — CBC WITH DIFFERENTIAL/PLATELET
Abs Immature Granulocytes: 0.03 K/uL (ref 0.00–0.07)
Basophils Absolute: 0.1 K/uL (ref 0.0–0.1)
Basophils Relative: 1 %
Eosinophils Absolute: 0 K/uL (ref 0.0–0.5)
Eosinophils Relative: 1 %
HCT: 38.2 % (ref 36.0–46.0)
Hemoglobin: 11.1 g/dL — ABNORMAL LOW (ref 12.0–15.0)
Immature Granulocytes: 1 %
Lymphocytes Relative: 32 %
Lymphs Abs: 2.1 K/uL (ref 0.7–4.0)
MCH: 22.3 pg — ABNORMAL LOW (ref 26.0–34.0)
MCHC: 29.1 g/dL — ABNORMAL LOW (ref 30.0–36.0)
MCV: 76.7 fL — ABNORMAL LOW (ref 80.0–100.0)
Monocytes Absolute: 0.5 K/uL (ref 0.1–1.0)
Monocytes Relative: 7 %
Neutro Abs: 3.8 K/uL (ref 1.7–7.7)
Neutrophils Relative %: 58 %
Platelets: 523 K/uL — ABNORMAL HIGH (ref 150–400)
RBC: 4.98 MIL/uL (ref 3.87–5.11)
RDW: 18.5 % — ABNORMAL HIGH (ref 11.5–15.5)
WBC: 6.5 K/uL (ref 4.0–10.5)
nRBC: 0 % (ref 0.0–0.2)

## 2024-02-17 LAB — PREGNANCY, URINE: Preg Test, Ur: NEGATIVE

## 2024-02-17 LAB — URINALYSIS, ROUTINE W REFLEX MICROSCOPIC
Bacteria, UA: NONE SEEN
Bilirubin Urine: NEGATIVE
Glucose, UA: NEGATIVE mg/dL
Hgb urine dipstick: NEGATIVE
Ketones, ur: NEGATIVE mg/dL
Nitrite: NEGATIVE
Protein, ur: NEGATIVE mg/dL
Specific Gravity, Urine: 1.012 (ref 1.005–1.030)
pH: 5 (ref 5.0–8.0)

## 2024-02-17 MED ORDER — DOCUSATE SODIUM 250 MG PO CAPS: 250.0000 mg | ORAL_CAPSULE | Freq: Every day | ORAL | 0 refills | Status: DC

## 2024-02-17 MED ORDER — MORPHINE SULFATE (PF) 4 MG/ML IV SOLN
6.0000 mg | Freq: Once | INTRAVENOUS | Status: AC
  Administered 2024-02-17: 6 mg via INTRAVENOUS
  Filled 2024-02-17: qty 2

## 2024-02-17 NOTE — ED Notes (Signed)
 Patient provided food and drink by RN

## 2024-02-17 NOTE — ED Provider Notes (Signed)
 [place] EMERGENCY DEPARTMENT AT [facility] Provider Note   CSN: [id number] Arrival date & time: 02/17/24  0757     Patient presents with: Flank Pain   Kelly Huang is a 38 y.o. female.   38 year old female presents with 2 days of right-sided flank pain.  History of UTI and this feels similar.  Has had nausea but no vomiting.  No vaginal bleeding or discharge.  Denies any dysuria but is having some frequency.  The pain is persistent and sharp and does not radiate.  Called EMS and was transported here       Prior to Admission medications   Medication Sig Start Date End Date Taking? Authorizing Provider  acetaminophen  (TYLENOL ) 500 MG tablet Take 1,000 mg by mouth 2 (two) times daily as needed for moderate pain (pain score 4-6), fever or headache.    [provider]  cefdinir  (OMNICEF ) 300 MG capsule Take 1 capsule (300 mg total) by mouth 2 (two) times daily. 11/30/23   Bernard Drivers, MD  ferrous sulfate  325 (65 FE) MG tablet Take 1 tablet (325 mg total) by mouth 3 (three) times a week. 11/23/23   Alba Sharper, MD  oxyCODONE  (OXY IR/ROXICODONE ) 5 MG immediate release tablet Take 1 tablet (5 mg total) by mouth every 6 (six) hours as needed for moderate pain (pain score 4-6) or breakthrough pain. 11/21/23   Quillen, Michael, MD  polyethylene glycol powder (GLYCOLAX /MIRALAX ) 17 GM/SCOOP powder Take 1 capful (17 g) by mouth daily. 11/22/23   Quillen, Michael, MD  senna (SENOKOT) 8.6 MG TABS tablet Take 1 tablet (8.6 mg total) by mouth daily. 11/22/23   Alba Sharper, MD    Allergies: Bactrim [sulfamethoxazole-trimethoprim]    Review of Systems  All other systems reviewed and are negative.   Updated Vital Signs BP 124/85 (BP Location: Left Arm)   Pulse 85   Temp 97.7 F (36.5 C) (Oral)   Resp 18   Ht 1.524 m (5')   Wt 57.6 kg   LMP 02/10/2024 (Exact Date)   SpO2 100%   BMI 24.80 kg/m   Physical Exam Vitals and nursing note reviewed.   Constitutional:      General: She is not in acute distress.    Appearance: Normal appearance. She is well-developed. She is not toxic-appearing.  HENT:     Head: Normocephalic and atraumatic.  Eyes:     General: Lids are normal.     Conjunctiva/sclera: Conjunctivae normal.     Pupils: Pupils are equal, round, and reactive to light.  Neck:     Thyroid: No thyroid mass.     Trachea: No tracheal deviation.  Cardiovascular:     Rate and Rhythm: Normal rate and regular rhythm.     Heart sounds: Normal heart sounds. No murmur heard.    No gallop.  Pulmonary:     Effort: Pulmonary effort is normal. No respiratory distress.     Breath sounds: Normal breath sounds. No stridor. No decreased breath sounds, wheezing, rhonchi or rales.  Abdominal:     General: There is no distension.     Palpations: Abdomen is soft.     Tenderness: There is no abdominal tenderness. There is right CVA tenderness. There is no rebound.  Musculoskeletal:        General: No tenderness. Normal range of motion.     Cervical back: Normal range of motion and neck supple.  Skin:    General: Skin is warm and dry.     Findings:  No abrasion or rash.  Neurological:     Mental Status: She is alert and oriented to person, place, and time. Mental status is at baseline.     GCS: GCS eye subscore is 4. GCS verbal subscore is 5. GCS motor subscore is 6.     Cranial Nerves: No cranial nerve deficit.     Sensory: No sensory deficit.     Motor: Motor function is intact.  Psychiatric:        Attention and Perception: Attention normal.        Speech: Speech normal.        Behavior: Behavior normal.     (all labs ordered are listed, but only abnormal results are displayed) Labs Reviewed  URINALYSIS, ROUTINE W REFLEX MICROSCOPIC  PREGNANCY, URINE  CBC WITH DIFFERENTIAL/PLATELET  I-STAT CHEM 8, ED    EKG: None  Radiology: No results found.   Procedures   Medications Ordered in the ED  morphine  (PF) 4 MG/ML  injection 6 mg (has no administration in time range)                                    Medical Decision Making Amount and/or Complexity of Data Reviewed Labs: ordered. Radiology: ordered.  Risk Prescription drug management.   Patient given work for pain here.  Felt better.  Labs are reassuring.  Urinalysis without evidence of infection.  Renal CT shows constipation.  Patient is abdomen is nonsurgical will be discharged     Final diagnoses:  None    ED Discharge Orders     None          Dasie Faden, MD 02/17/24 1020

## 2024-02-17 NOTE — ED Triage Notes (Signed)
 Pt to ED via [facility] [place] road[place], pt was walking to where she stays. Pt c/o right flank pain that radiates from right lower back radiating into right abdomen, similar to previous pain with kidney infection. Pt denies injury, denies urinary symptoms, denies blood in urine, denies discharge.   140/84 78 100%

## 2024-02-17 NOTE — ED Notes (Addendum)
 Patient endorses right flank pain that radiates to right abdomen. No urinary symptoms. Patient endorses no nausea/vomiting. Pain rated 10/10 and constantly there.

## 2024-02-17 NOTE — ED Notes (Addendum)
 Patient discharged by RN. Patient verbalizes understanding of instructions without additional questions. In wheelchair to lobby. Taxi to take patient back to home where she is staying. Taxi called by Jasmine NS

## 2024-03-11 ENCOUNTER — Inpatient Hospital Stay (HOSPITAL_COMMUNITY)
Admission: RE | Admit: 2024-03-11 | Discharge: 2024-03-11 | Disposition: A | Discharge status: Home / Self Care | Attending: Obstetrics & Gynecology | Admitting: Obstetrics & Gynecology

## 2024-03-11 ENCOUNTER — Other Ambulatory Visit: Payer: Self-pay

## 2024-03-11 DIAGNOSIS — N939 Abnormal uterine and vaginal bleeding, unspecified: Secondary | ICD-10-CM | POA: Diagnosis present

## 2024-03-11 DIAGNOSIS — N946 Dysmenorrhea, unspecified: Secondary | ICD-10-CM | POA: Diagnosis not present

## 2024-03-11 DIAGNOSIS — Z3202 Encounter for pregnancy test, result negative: Secondary | ICD-10-CM | POA: Diagnosis not present

## 2024-03-11 DIAGNOSIS — Z789 Other specified health status: Secondary | ICD-10-CM

## 2024-03-11 DIAGNOSIS — R1084 Generalized abdominal pain: Secondary | ICD-10-CM | POA: Diagnosis not present

## 2024-03-11 DIAGNOSIS — O200 Threatened abortion: Secondary | ICD-10-CM | POA: Diagnosis not present

## 2024-03-11 DIAGNOSIS — R58 Hemorrhage, not elsewhere classified: Secondary | ICD-10-CM | POA: Diagnosis not present

## 2024-03-11 DIAGNOSIS — F411 Generalized anxiety disorder: Secondary | ICD-10-CM | POA: Diagnosis not present

## 2024-03-11 LAB — HCG, QUANTITATIVE, PREGNANCY: hCG, Beta Chain, Quant, S: <1 m[IU]/mL (ref <5)

## 2024-03-11 LAB — POCT PREGNANCY, URINE: Preg Test, Ur: NEGATIVE

## 2024-03-11 MED ORDER — IBUPROFEN 800 MG PO TABS
800.0000 mg | ORAL_TABLET | Freq: Once | ORAL | Status: AC
  Administered 2024-03-11: 800 mg via ORAL
  Filled 2024-03-11: qty 1

## 2024-03-11 MED ORDER — ACETAMINOPHEN 500 MG PO TABS
1000.0000 mg | ORAL_TABLET | Freq: Once | ORAL | Status: AC
Start: 2024-03-11 — End: 2024-03-11
  Administered 2024-03-11: 1000 mg via ORAL
  Filled 2024-03-11: qty 2

## 2024-03-11 NOTE — MAU Provider Note (Signed)
 History     CSN: [id number]  Arrival date and time: 03/11/24 1019   Event Date/Time   First Provider Initiated Contact with Patient 03/11/24 1226      Chief Complaint  Patient presents with   Vaginal Bleeding   Abdominal Pain   Vaginal Bleeding Associated symptoms include abdominal pain. Pertinent negatives include no back pain, chills, diarrhea, dysuria, fever, flank pain, nausea, rash, sore throat or vomiting.  Abdominal Pain Pertinent negatives include no diarrhea, dysuria, fever, nausea or vomiting.   38 y.o. [id number] here with complaints of vaginal bleeding and abdominal pain.  Patient was in her normal state of health and was at a court appearance this morning.  She reports while she was there she noted vaginal bleeding and severe cramping.  Patient was concerned that she might be pregnant.  Patient reports she had a home pregnancy test that was positive.  Patient's last menstrual period was 02/10/2024.  Patient was transported to our facility by EMS from the court house.  Patient denied feeling up a menstrual pad or tampon every hour.  She denies any dizziness or lightheadedness.  Denies nausea vomiting.  Denies fevers chills.   OB History     Gravida  2   Para  2   Term      Preterm  2   AB      Living  1      SAB      IAB      Ectopic      Multiple  0   Live Births  1        Obstetric Comments  IUFD 7 months         Past Medical History:  Diagnosis Date   Alcohol use complicating pregnancy 02/18/2022   Asthma    Asthma affecting pregnancy in second trimester 03/01/2022   Cocaine use complicating pregnancy 02/18/2022   Herniated lumbar intervertebral disc    Neck pain 11/20/2023   Seizures (HCC)     No past surgical history on file.  No family history on file.  Social History   Tobacco Use   Smoking status: Every Day    Current packs/day: 0.25    Types: Cigarettes   Smokeless tobacco: Never  Vaping Use   Vaping status: Never  Used  Substance Use Topics   Alcohol use: Yes    Alcohol/week: 1.0 standard drink of alcohol    Types: 1 Shots of liquor per week   Drug use: Yes    Types: Cocaine    Comment: last use crack 11/19/22    Allergies:  Allergies  Allergen Reactions   Bactrim [Sulfamethoxazole-Trimethoprim] Other (See Comments)    Dizziness     Medications Prior to Admission  Medication Sig Dispense Refill Last Dose/Taking   acetaminophen  (TYLENOL ) 500 MG tablet Take 1,000 mg by mouth 2 (two) times daily as needed for moderate pain (pain score 4-6), fever or headache.      cefdinir  (OMNICEF ) 300 MG capsule Take 1 capsule (300 mg total) by mouth 2 (two) times daily. 14 capsule 0    docusate sodium  (COLACE) 250 MG capsule Take 1 capsule (250 mg total) by mouth daily. 10 capsule 0    ferrous sulfate  325 (65 FE) MG tablet Take 1 tablet (325 mg total) by mouth 3 (three) times a week. 30 tablet 0    oxyCODONE  (OXY IR/ROXICODONE ) 5 MG immediate release tablet Take 1 tablet (5 mg total) by mouth every 6 (six) hours as needed  for moderate pain (pain score 4-6) or breakthrough pain. 20 tablet 0    polyethylene glycol powder (GLYCOLAX /MIRALAX ) 17 GM/SCOOP powder Take 1 capful (17 g) by mouth daily. 238 g 0    senna (SENOKOT) 8.6 MG TABS tablet Take 1 tablet (8.6 mg total) by mouth daily. 120 tablet 0     Review of Systems  Constitutional:  Negative for chills and fever.  HENT:  Negative for congestion and sore throat.   Eyes:  Negative for pain and visual disturbance.  Respiratory:  Negative for cough, chest tightness and shortness of breath.   Cardiovascular:  Negative for chest pain.  Gastrointestinal:  Positive for abdominal pain. Negative for diarrhea, nausea and vomiting.  Endocrine: Negative for cold intolerance and heat intolerance.  Genitourinary:  Positive for vaginal bleeding. Negative for dysuria and flank pain.  Musculoskeletal:  Negative for back pain.  Skin:  Negative for rash.   Allergic/Immunologic: Negative for food allergies.  Neurological:  Negative for dizziness and light-headedness.  Psychiatric/Behavioral:  Negative for agitation.    Physical Exam   Blood pressure 129/81, pulse 71, temperature 98.6 F (37 C), temperature source Oral, resp. rate 20, height 5' (1.524 m), weight 47.1 kg, last menstrual period 02/10/2024, SpO2 100%, unknown if currently breastfeeding.  Physical Exam Vitals and nursing note reviewed.  Constitutional:      General: She is not in acute distress.    Appearance: She is well-developed.  HENT:     Head: Normocephalic and atraumatic.  Eyes:     General: No scleral icterus.    Conjunctiva/sclera: Conjunctivae normal.  Cardiovascular:     Rate and Rhythm: Normal rate.  Pulmonary:     Effort: Pulmonary effort is normal.  Chest:     Chest wall: No tenderness.  Abdominal:     Palpations: Abdomen is soft.     Tenderness: There is no abdominal tenderness. There is no guarding or rebound.  Genitourinary:    Vagina: Normal.  Musculoskeletal:        General: Normal range of motion.     Cervical back: Normal range of motion and neck supple.  Skin:    General: Skin is warm and dry.     Findings: No rash.  Neurological:     Mental Status: She is alert and oriented to person, place, and time.     MAU Course  Procedures  MDM- moderate  Given patient's previous home pregnancy test that was positive and a urine pregnancy test here in the MAU that was negative we obtained a serum beta-hCG.  Results of serum beta-hCG show the patient is not pregnant.  Reviewed with patient this is most likely her normal menstruation as her last period was exactly 30 days prior. She desired crackers and ginger ale with there tylenol  and ibuprofen .    Results for orders placed or performed during the hospital encounter of 03/11/24 (from the past 24 hours)  Pregnancy, urine POC     Status: None   Collection Time: 03/11/24 10:49 AM  Result Value  Ref Range   Preg Test, Ur NEGATIVE NEGATIVE  hCG, quantitative, pregnancy     Status: None   Collection Time: 03/11/24 11:14 AM  Result Value Ref Range   hCG, Beta Chain, Quant, S <1 <5 mIU/mL   Orders Placed This Encounter  Procedures   hCG, quantitative, pregnancy   Pregnancy, urine POC   Meds ordered this encounter  Medications   ibuprofen  (ADVIL ) tablet 800 mg   acetaminophen  (TYLENOL ) tablet  1,000 mg    Assessment and Plan   1. Menses painful   2. Vaginal bleeding   3. Not currently pregnant     -Discharged with normal return precautions for bleeding -Not pregnant and should follow up in regular ER vs urgent care for future concerns   Suzen Maryan Masters 03/11/2024, 12:26 PM

## 2024-03-11 NOTE — MAU Note (Signed)
 Kelly Huang is a 38 y.o. at Unknown here in MAU reporting: she having heavy VB that began today while in court.  States she's had 2 +HPT, one 2 weeks ago and another four weeks ago.  States she is also having abdominal cramping  LMP: 02/10/2024  Onset of complaint: today Pain score: 9 Vitals:   03/11/24 1050  BP: 129/81  Pulse: 71  Resp: 20  Temp: 98.6 F (37 C)  SpO2: 100%     FHT: NA  Lab orders placed from triage: UPT

## 2024-03-11 NOTE — Discharge Instructions (Addendum)
 You are not pregnant  Your urine and blood pregnancy test were negative.    Your bleeding is likely due to your normal menses.   If you bleeding is severe, filling 1 pad an hour with dizziness or lightheadedness you should seek care  For cramping you can take Tylenol  or Ibuprofen . You were given both of these at your request here in the MAU.   For future concerns, you can go to any Urgent care or Emergency room since you are not pregnant.

## 2024-03-19 DIAGNOSIS — F411 Generalized anxiety disorder: Secondary | ICD-10-CM | POA: Diagnosis not present

## 2024-04-16 DIAGNOSIS — F411 Generalized anxiety disorder: Secondary | ICD-10-CM | POA: Diagnosis not present

## 2024-04-23 DIAGNOSIS — F411 Generalized anxiety disorder: Secondary | ICD-10-CM | POA: Diagnosis not present

## 2024-04-29 DIAGNOSIS — F411 Generalized anxiety disorder: Secondary | ICD-10-CM | POA: Diagnosis not present

## 2024-04-30 DIAGNOSIS — F411 Generalized anxiety disorder: Secondary | ICD-10-CM | POA: Diagnosis not present

## 2024-05-06 DIAGNOSIS — F411 Generalized anxiety disorder: Secondary | ICD-10-CM | POA: Diagnosis not present

## 2024-06-24 ENCOUNTER — Encounter (HOSPITAL_COMMUNITY): Payer: Self-pay

## 2024-06-24 ENCOUNTER — Emergency Department (HOSPITAL_COMMUNITY)
Admission: EM | Admit: 2024-06-24 | Discharge: 2024-06-25 | Disposition: A | Discharge status: Home / Self Care | Attending: Emergency Medicine | Admitting: Emergency Medicine

## 2024-06-24 ENCOUNTER — Other Ambulatory Visit: Payer: Self-pay

## 2024-06-24 ENCOUNTER — Emergency Department (HOSPITAL_COMMUNITY)

## 2024-06-24 DIAGNOSIS — F17210 Nicotine dependence, cigarettes, uncomplicated: Secondary | ICD-10-CM | POA: Diagnosis not present

## 2024-06-24 DIAGNOSIS — R0602 Shortness of breath: Secondary | ICD-10-CM | POA: Insufficient documentation

## 2024-06-24 DIAGNOSIS — R0781 Pleurodynia: Secondary | ICD-10-CM | POA: Diagnosis present

## 2024-06-24 DIAGNOSIS — J45909 Unspecified asthma, uncomplicated: Secondary | ICD-10-CM | POA: Diagnosis not present

## 2024-06-24 DIAGNOSIS — Z5900 Homelessness unspecified: Secondary | ICD-10-CM | POA: Insufficient documentation

## 2024-06-24 LAB — CBC
HCT: 38.6 % (ref 36.0–46.0)
Hemoglobin: 11.2 g/dL — ABNORMAL LOW (ref 12.0–15.0)
MCH: 21.7 pg — ABNORMAL LOW (ref 26.0–34.0)
MCHC: 29 g/dL — ABNORMAL LOW (ref 30.0–36.0)
MCV: 75 fL — ABNORMAL LOW (ref 80.0–100.0)
Platelets: 378 K/uL (ref 150–400)
RBC: 5.15 MIL/uL — ABNORMAL HIGH (ref 3.87–5.11)
RDW: 17.1 % — ABNORMAL HIGH (ref 11.5–15.5)
WBC: 7.3 K/uL (ref 4.0–10.5)
nRBC: 0 % (ref 0.0–0.2)

## 2024-06-24 LAB — HCG, SERUM, QUALITATIVE: Preg, Serum: NEGATIVE

## 2024-06-24 LAB — BASIC METABOLIC PANEL WITH GFR
Anion gap: 9 (ref 5–15)
BUN: 14 mg/dL (ref 6–20)
CO2: 25 mmol/L (ref 22–32)
Calcium: 8.9 mg/dL (ref 8.9–10.3)
Chloride: 105 mmol/L (ref 98–111)
Creatinine, Ser: 0.82 mg/dL (ref 0.44–1.00)
GFR, Estimated: >60 mL/min
Glucose, Bld: 99 mg/dL (ref 70–99)
Potassium: 4.1 mmol/L (ref 3.5–5.1)
Sodium: 139 mmol/L (ref 135–145)

## 2024-06-24 LAB — LIPASE, BLOOD: Lipase: 26 U/L (ref 11–51)

## 2024-06-24 LAB — RESP PANEL BY RT-PCR (RSV, FLU A&B, COVID)  RVPGX2
Influenza A by PCR: NEGATIVE
Influenza B by PCR: NEGATIVE
Resp Syncytial Virus by PCR: NEGATIVE
SARS Coronavirus 2 by RT PCR: NEGATIVE

## 2024-06-24 LAB — HEPATIC FUNCTION PANEL
ALT: 12 U/L (ref 0–44)
AST: 21 U/L (ref 15–41)
Albumin: 4.3 g/dL (ref 3.5–5.0)
Alkaline Phosphatase: 70 U/L (ref 38–126)
Bilirubin, Direct: <0.1 mg/dL (ref 0.0–0.2)
Total Bilirubin: <0.2 mg/dL (ref 0.0–1.2)
Total Protein: 7.4 g/dL (ref 6.5–8.1)

## 2024-06-24 LAB — D-DIMER, QUANTITATIVE: D-Dimer, Quant: 0.41 ug{FEU}/mL (ref 0.00–0.50)

## 2024-06-24 MED ORDER — IBUPROFEN 400 MG PO TABS
600.0000 mg | ORAL_TABLET | Freq: Once | ORAL | Status: AC
  Administered 2024-06-24: 600 mg via ORAL
  Filled 2024-06-24: qty 1

## 2024-06-24 MED ORDER — ALBUTEROL SULFATE HFA 108 (90 BASE) MCG/ACT IN AERS
2.0000 | INHALATION_SPRAY | RESPIRATORY_TRACT | Status: AC
  Administered 2024-06-24: 2 via RESPIRATORY_TRACT
  Filled 2024-06-24: qty 6.7

## 2024-06-24 NOTE — ED Provider Triage Note (Signed)
 Emergency Medicine Provider Triage Evaluation Note  Kelly Huang , a 39 y.o. female  was evaluated in triage.  Pt complains of cough for several days and pleuritic chest pain and shortness of breath today.  Review of Systems  Positive: Pleuritic chest pain, shortness of breath, cough Negative: No fevers, chills, nausea, vomiting, constipation, diarrhea, urinary changes, leg pain, leg swelling, or history of blood clots  Physical Exam  BP 122/89 (BP Location: Right Arm)   Pulse 85   Temp 98.2 F (36.8 C) (Oral)   Resp 16   Ht 5' (1.524 m)   Wt 57.6 kg   LMP 06/09/2024   SpO2 100%   BMI 24.80 kg/m  Gen:   Awake, no distress   Resp:  Normal effort, chest tenderness across her chest MSK:   Moves extremities without difficulty  Other:  Abdomen nontender  Medical Decision Making  Medically screening exam initiated at 8:08 PM.  Appropriate orders placed.  Kelly Huang was informed that the remainder of the evaluation will be completed by another provider, this initial triage assessment does not replace that evaluation, and the importance of remaining in the ED until their evaluation is complete.  Kelly Huang is a 40 y.o. female with a past medical history significant for substance abuse, previous pneumonia, previous pyelonephritis, asthma, anemia, and seizures who presents for pleuritic chest pain and shortness of breath.  According to patient, she has had some dry cough for the last few days but otherwise no fevers chills or congestion.  She reports that today she woke up having pain across her chest going around to the back that is very pleuritic.  She cannot take a deep breath without hurting.  Denies significant pain in the abdomen but does go across where her diaphragm is.  She reports some shortness of breath and cannot catch her breath.  Otherwise she denies new leg pain or leg swelling and denies history of blood clots.  Denies recent travel or previous surgeries.  She denies  any trauma.  Denies rash to suggest shingles.  Denies other complaints at this time.  On exam, lungs were clear but chest was tender to palpation.  I did not appreciate a murmur.  Abdomen and back nontender.  Intact sensation strength and pulse in extremities and legs were nontender and nonedematous.  EKG did not show STEMI.  Given the patient's extremely pleuritic discomfort I suspect it is muscle musculoskeletal spasm and pain possibly related to the coughing or yawning and stretching which she reports seem to provoke the symptoms this morning.  However, with the mild discomfort she is describing, we will get a D-dimer to make sure is not a thromboembolic etiology.  She will have chest x-ray and labs and due to the pain with deep breathing across her upper abdomen and lower torso, will add on hepatic function and lipase make sure there is not a subdiaphragmatic cause of her pain.  Will check for COVID/flu/RSV given the ongoing pandemic and amount of flu in the community.  Anticipate reassessment when she gets seen by provider in her room to determine disposition.    Kelly Huang, Lonni PARAS, MD 06/24/24 2011

## 2024-06-24 NOTE — ED Provider Notes (Signed)
 " [facility] Provider Note   CSN: [id number] Arrival date & time: 06/24/24  1919     Patient presents with: Shortness of Breath   Kelly Huang is a 39 y.o. female with history of cocaine abuse, substance abuse abuse, seizures, homelessness, iron  deficiency anemia, asthma.  Presents to ED complaining of shortness of breath.  Reports that the shortness of breath began about 1 hour prior to arrival from ED.  She reports that she does smoke crack cocaine as well as cigarettes.  She reports she last smoked a cigarette about 1 hour prior to arrival to the ED.  Patient reports that she was short of breath when she smoked at this cigarette.  She states that she has chest pain on inspiration.  She does have chest tenderness to palpation.  She denies nausea, vomiting or fevers.  Denies lightheadedness, dizziness, weakness.  Denies syncope.  Requesting food and cranberry juice on exam.   Shortness of Breath Associated symptoms: chest pain        Prior to Admission medications  Medication Sig Start Date End Date Taking? Authorizing Provider  ibuprofen  (ADVIL ) 600 MG tablet Take 1 tablet (600 mg total) by mouth every 6 (six) hours as needed. 06/25/24  Yes Ruthell Lonni FALCON, PA-C  acetaminophen  (TYLENOL ) 500 MG tablet Take 1,000 mg by mouth 2 (two) times daily as needed for moderate pain (pain score 4-6), fever or headache.    [provider]  ferrous sulfate  325 (65 FE) MG tablet Take 1 tablet (325 mg total) by mouth 3 (three) times a week. 11/23/23   Quillen, Michael, MD  polyethylene glycol powder (GLYCOLAX /MIRALAX ) 17 GM/SCOOP powder Take 1 capful (17 g) by mouth daily. 11/22/23   Quillen, Michael, MD  senna (SENOKOT) 8.6 MG TABS tablet Take 1 tablet (8.6 mg total) by mouth daily. 11/22/23   Alba Sharper, MD    Allergies: Bactrim [sulfamethoxazole-trimethoprim]    Review of Systems  Respiratory:  Positive for shortness of breath.    Cardiovascular:  Positive for chest pain.  All other systems reviewed and are negative.   Updated Vital Signs BP 105/65 (BP Location: Right Arm)   Pulse 75   Temp 98.2 F (36.8 C) (Oral)   Resp 14   Ht 5' (1.524 m)   Wt 57.6 kg   LMP 06/09/2024   SpO2 100%   BMI 24.80 kg/m   Physical Exam Vitals and nursing note reviewed.  Constitutional:      General: She is not in acute distress.    Appearance: She is well-developed.  HENT:     Head: Normocephalic and atraumatic.  Eyes:     Conjunctiva/sclera: Conjunctivae normal.  Cardiovascular:     Rate and Rhythm: Normal rate and regular rhythm.     Heart sounds: No murmur heard.    Comments: Reproducible chest tenderness Pulmonary:     Effort: Pulmonary effort is normal. No respiratory distress.     Breath sounds: Normal breath sounds.     Comments: Lungs clear to auscultation bilaterally Chest:     Chest wall: Tenderness present.  Abdominal:     Palpations: Abdomen is soft.     Tenderness: There is no abdominal tenderness.  Musculoskeletal:        General: No swelling.     Cervical back: Neck supple.  Skin:    General: Skin is warm and dry.     Capillary Refill: Capillary refill takes less than 2 seconds.  Neurological:  Mental Status: She is alert and oriented to person, place, and time. Mental status is at baseline.  Psychiatric:        Mood and Affect: Mood normal.     (all labs ordered are listed, but only abnormal results are displayed) Labs Reviewed  CBC - Abnormal; Notable for the following components:      Result Value   RBC 5.15 (*)    Hemoglobin 11.2 (*)    MCV 75.0 (*)    MCH 21.7 (*)    MCHC 29.0 (*)    RDW 17.1 (*)    All other components within normal limits  RESP PANEL BY RT-PCR (RSV, FLU A&B, COVID)  RVPGX2  BASIC METABOLIC PANEL WITH GFR  HCG, SERUM, QUALITATIVE  HEPATIC FUNCTION PANEL  LIPASE, BLOOD  D-DIMER, QUANTITATIVE  TROPONIN T, HIGH SENSITIVITY    EKG: EKG  Interpretation Date/Time:  Tuesday June 24 2024 19:36:25 EST Ventricular Rate:  69 PR Interval:  164 QRS Duration:  94 QT Interval:  372 QTC Calculation: 398 R Axis:   105  Text Interpretation: Normal sinus rhythm with sinus arrhythmia Rightward axis Cannot rule out Anterior infarct , age undetermined Abnormal ECG When compared with ECG of 17-Nov-2023 08:48, No significant change was found Confirmed by Raford Lenis ([id number]) on 06/25/2024 1:32:26 AM  Radiology: [facility] Chest 2 View Result Date: 06/24/2024 EXAM: 2 VIEW(S) XRAY OF THE CHEST 06/24/2024 07:57:00 PM COMPARISON: 11/17/2023 CLINICAL HISTORY: Shortness of breath shortness of breath shortness of breath shortness of breath shortness of breath FINDINGS: LUNGS AND PLEURA: No focal pulmonary opacity. No pleural effusion. No pneumothorax. HEART AND MEDIASTINUM: No acute abnormality of the cardiac and mediastinal silhouettes. BONES AND SOFT TISSUES: Mild thoracic scoliosis. Chronic healed right lateral 6th rib fracture. IMPRESSION: 1. No acute cardiopulmonary abnormality. Electronically signed by: Greig Pique MD 06/24/2024 08:08 PM EST RP Workstation: [id number]    Procedures   Medications Ordered in the ED  ibuprofen  (ADVIL ) tablet 600 mg (600 mg Oral Given 06/24/24 2336)  albuterol  (VENTOLIN  HFA) 108 (90 Base) MCG/ACT inhaler 2 puff (2 puffs Inhalation Given 06/24/24 2335)    Medical Decision Making Amount and/or Complexity of Data Reviewed Labs: ordered. Radiology: ordered.  Risk Prescription drug management.   39 year old female presents for evaluation of shortness of breath.  On exam, HD stable.  Afebrile and nontachycardic.  Lung sounds clear bilaterally, no hypoxia.  Abdomen soft and compressible.  Neuroexam at baseline.  Chest wall with tenderness.  Patient assessed utilizing CBC, BMP, D-dimer, troponin x 2, lipase, viral panel, hepatic function panel, chest x-ray, EKG.  CBC without leukocytosis, baseline hemoglobin.  No  electrolyte derangement.  Metabolic panel grossly unremarkable.  D-dimer negative so doubt PE.  Troponin is undetectable.  Lipase 26.  Viral panel negative for all.  Hepatic function panel unremarkable.  Chest x-ray unremarkable.  EKG nonischemic.  Patient given albuterol  inhaler for her shortness of breath, reports that her shortness of breath has resolved after albuterol . Given ibuprofen  for chest discomfort, states chest discomfort is decreased.  Will discharge patient at this time with ibuprofen  prescription.  Have advised her to follow-up with PCP, she has been referred.  She was given return precautions and she voiced understanding.  Stable to discharge.    Final diagnoses:  Pleuritic chest pain    ED Discharge Orders          Ordered    ibuprofen  (ADVIL ) 600 MG tablet  Every 6 hours PRN  06/25/24 0159               Ruthell Lonni FALCON, PA-C 06/25/24 0200    Raford Lenis, MD 06/25/24 2247  "

## 2024-06-24 NOTE — ED Notes (Signed)
 With xray

## 2024-06-24 NOTE — ED Triage Notes (Signed)
 Patient arrived EMS from a hotel [facility]). States 1 hour ago she woke up with shortness of breath. Pain on inhalation in the chest and back. Rates pain 8/10.

## 2024-06-25 LAB — TROPONIN T, HIGH SENSITIVITY: Troponin T High Sensitivity: <6 ng/L (ref 0–19)

## 2024-06-25 MED ORDER — SODIUM CHLORIDE 0.9 % IV BOLUS: 1000.0000 mL | Freq: Once | INTRAVENOUS | Status: DC

## 2024-06-25 MED ORDER — IBUPROFEN 600 MG PO TABS: 600.0000 mg | ORAL_TABLET | Freq: Four times a day (QID) | ORAL | 0 refills | Status: AC | PRN

## 2024-06-25 NOTE — Discharge Instructions (Signed)
 Please begin utilizing albuterol  inhaler every 6 hours, 2 puffs, as needed for shortness of breath.  Take ibuprofen  600 mg every 6 hours as needed for chest tenderness.  Follow-up with PCP which you have been referred to.  Call and make an appointment to be seen.  Return to ED with new symptoms.
